# Patient Record
Sex: Female | Born: 1990 | Race: Black or African American | Hispanic: No | Marital: Single | Smoking: Current some day smoker
Health system: Southern US, Community
[De-identification: ages and names within clinical notes are randomized; demographics above are authoritative.]

## PROBLEM LIST (undated history)

## (undated) ENCOUNTER — Inpatient Hospital Stay (HOSPITAL_COMMUNITY): Payer: Self-pay

## (undated) DIAGNOSIS — IMO0002 Reserved for concepts with insufficient information to code with codable children: Secondary | ICD-10-CM

## (undated) DIAGNOSIS — R87619 Unspecified abnormal cytological findings in specimens from cervix uteri: Secondary | ICD-10-CM

## (undated) HISTORY — DX: Reserved for concepts with insufficient information to code with codable children: IMO0002

## (undated) HISTORY — DX: Unspecified abnormal cytological findings in specimens from cervix uteri: R87.619

---
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
## 2009-04-06 ENCOUNTER — Inpatient Hospital Stay (HOSPITAL_COMMUNITY): Admission: AD | Admit: 2009-04-06 | Discharge: 2009-04-06 | Payer: Self-pay | Admitting: Obstetrics & Gynecology

## 2009-04-06 ENCOUNTER — Ambulatory Visit: Payer: Self-pay | Admitting: Advanced Practice Midwife

## 2009-04-28 ENCOUNTER — Inpatient Hospital Stay (HOSPITAL_COMMUNITY): Admission: AD | Admit: 2009-04-28 | Discharge: 2009-04-28 | Payer: Self-pay | Admitting: Family Medicine

## 2009-04-28 ENCOUNTER — Ambulatory Visit: Payer: Self-pay | Admitting: Family Medicine

## 2009-05-13 ENCOUNTER — Ambulatory Visit (HOSPITAL_COMMUNITY): Admission: RE | Admit: 2009-05-13 | Discharge: 2009-05-13 | Payer: Self-pay | Admitting: Obstetrics & Gynecology

## 2009-08-21 ENCOUNTER — Ambulatory Visit: Payer: Self-pay | Admitting: Advanced Practice Midwife

## 2009-08-21 ENCOUNTER — Inpatient Hospital Stay (HOSPITAL_COMMUNITY): Admission: AD | Admit: 2009-08-21 | Discharge: 2009-08-21 | Payer: Self-pay | Admitting: Family Medicine

## 2009-08-26 ENCOUNTER — Inpatient Hospital Stay (HOSPITAL_COMMUNITY): Admission: AD | Admit: 2009-08-26 | Discharge: 2009-08-26 | Payer: Self-pay | Admitting: Obstetrics & Gynecology

## 2009-08-26 ENCOUNTER — Ambulatory Visit: Payer: Self-pay | Admitting: Advanced Practice Midwife

## 2009-08-27 ENCOUNTER — Inpatient Hospital Stay (HOSPITAL_COMMUNITY): Admission: AD | Admit: 2009-08-27 | Discharge: 2009-08-27 | Payer: Self-pay | Admitting: Obstetrics & Gynecology

## 2009-08-28 ENCOUNTER — Ambulatory Visit: Payer: Self-pay | Admitting: Advanced Practice Midwife

## 2009-08-28 ENCOUNTER — Inpatient Hospital Stay (HOSPITAL_COMMUNITY): Admission: AD | Admit: 2009-08-28 | Discharge: 2009-08-30 | Payer: Self-pay | Admitting: Obstetrics & Gynecology

## 2009-12-28 ENCOUNTER — Emergency Department (HOSPITAL_COMMUNITY): Admission: EM | Admit: 2009-12-28 | Discharge: 2009-12-28 | Payer: Self-pay | Admitting: Emergency Medicine

## 2010-01-24 ENCOUNTER — Emergency Department (HOSPITAL_COMMUNITY)
Admission: EM | Admit: 2010-01-24 | Discharge: 2010-01-24 | Payer: Self-pay | Source: Home / Self Care | Admitting: Family Medicine

## 2010-02-19 ENCOUNTER — Emergency Department (HOSPITAL_COMMUNITY): Admission: EM | Admit: 2010-02-19 | Discharge: 2010-02-20 | Payer: Self-pay | Admitting: Emergency Medicine

## 2010-02-21 ENCOUNTER — Emergency Department (HOSPITAL_COMMUNITY): Admission: EM | Admit: 2010-02-21 | Discharge: 2010-02-21 | Payer: Self-pay | Admitting: Emergency Medicine

## 2010-06-25 ENCOUNTER — Emergency Department (HOSPITAL_COMMUNITY)
Admission: EM | Admit: 2010-06-25 | Discharge: 2010-06-26 | Payer: Self-pay | Source: Home / Self Care | Admitting: Emergency Medicine

## 2010-06-28 LAB — POCT PREGNANCY, URINE: Preg Test, Ur: NEGATIVE

## 2010-08-26 LAB — POCT PREGNANCY, URINE: Preg Test, Ur: NEGATIVE

## 2010-08-27 LAB — POCT PREGNANCY, URINE: Preg Test, Ur: NEGATIVE

## 2010-08-28 LAB — WET PREP, GENITAL
Trich, Wet Prep: NONE SEEN
Yeast Wet Prep HPF POC: NONE SEEN

## 2010-08-28 LAB — URINE MICROSCOPIC-ADD ON

## 2010-08-28 LAB — URINALYSIS, ROUTINE W REFLEX MICROSCOPIC
Bilirubin Urine: NEGATIVE
Glucose, UA: NEGATIVE mg/dL
Hgb urine dipstick: NEGATIVE
Ketones, ur: NEGATIVE mg/dL
Nitrite: NEGATIVE
Protein, ur: NEGATIVE mg/dL
Specific Gravity, Urine: 1.026 (ref 1.005–1.030)
Urobilinogen, UA: 1 mg/dL (ref 0.0–1.0)
pH: 6.5 (ref 5.0–8.0)

## 2010-08-28 LAB — URINE CULTURE
Colony Count: >=100000
Culture  Setup Time: 201107182134

## 2010-08-28 LAB — GC/CHLAMYDIA PROBE AMP, GENITAL: GC Probe Amp, Genital: NEGATIVE

## 2010-08-28 LAB — POCT PREGNANCY, URINE: Preg Test, Ur: NEGATIVE

## 2010-09-05 LAB — URINALYSIS, ROUTINE W REFLEX MICROSCOPIC
Bilirubin Urine: NEGATIVE
Glucose, UA: NEGATIVE mg/dL
Hgb urine dipstick: NEGATIVE
Hgb urine dipstick: NEGATIVE
Ketones, ur: NEGATIVE mg/dL
Protein, ur: NEGATIVE mg/dL
Protein, ur: NEGATIVE mg/dL
Urobilinogen, UA: 1 mg/dL (ref 0.0–1.0)

## 2010-09-05 LAB — WET PREP, GENITAL
Clue Cells Wet Prep HPF POC: NONE SEEN
Trich, Wet Prep: NONE SEEN

## 2010-09-05 LAB — COMPREHENSIVE METABOLIC PANEL
AST: 23 U/L (ref 0–37)
Albumin: 3.3 g/dL — ABNORMAL LOW (ref 3.5–5.2)
Calcium: 8.9 mg/dL (ref 8.4–10.5)
Creatinine, Ser: 0.49 mg/dL (ref 0.4–1.2)
GFR calc Af Amer: >60 mL/min (ref >60)
Total Protein: 6.9 g/dL (ref 6.0–8.3)

## 2010-09-05 LAB — CBC
MCHC: 33.3 g/dL (ref 30.0–36.0)
MCV: 84.5 fL (ref 78.0–100.0)
Platelets: 220 10*3/uL (ref 150–400)
WBC: 11.6 10*3/uL — ABNORMAL HIGH (ref 4.0–10.5)

## 2010-09-05 LAB — GC/CHLAMYDIA PROBE AMP, GENITAL: GC Probe Amp, Genital: NEGATIVE

## 2010-09-15 LAB — STREP B DNA PROBE

## 2010-09-15 LAB — GC/CHLAMYDIA PROBE AMP, GENITAL: GC Probe Amp, Genital: NEGATIVE

## 2010-09-16 LAB — WET PREP, GENITAL
Trich, Wet Prep: NONE SEEN
Yeast Wet Prep HPF POC: NONE SEEN

## 2010-09-16 LAB — RUBELLA SCREEN: Rubella: >500 IU/mL — ABNORMAL HIGH

## 2010-09-16 LAB — URINE MICROSCOPIC-ADD ON

## 2010-09-16 LAB — TYPE AND SCREEN
ABO/RH(D): O POS
Antibody Screen: NEGATIVE

## 2010-09-16 LAB — CBC
HCT: 29.6 % — ABNORMAL LOW (ref 36.0–46.0)
Hemoglobin: 9.8 g/dL — ABNORMAL LOW (ref 12.0–15.0)
RBC: 3.44 MIL/uL — ABNORMAL LOW (ref 3.87–5.11)
RDW: 13.2 % (ref 11.5–15.5)

## 2010-09-16 LAB — URINE CULTURE

## 2010-09-16 LAB — URINALYSIS, ROUTINE W REFLEX MICROSCOPIC
Glucose, UA: NEGATIVE mg/dL
Specific Gravity, Urine: 1.015 (ref 1.005–1.030)
Urobilinogen, UA: 0.2 mg/dL (ref 0.0–1.0)

## 2010-09-16 LAB — DIFFERENTIAL
Basophils Absolute: 0 10*3/uL (ref 0.0–0.1)
Eosinophils Relative: 2 % (ref 0–5)
Lymphocytes Relative: 28 % (ref 12–46)
Monocytes Absolute: 0.6 10*3/uL (ref 0.1–1.0)
Monocytes Relative: 8 % (ref 3–12)

## 2010-09-16 LAB — SICKLE CELL SCREEN: Sickle Cell Screen: NEGATIVE

## 2010-09-16 LAB — RAPID URINE DRUG SCREEN, HOSP PERFORMED
Barbiturates: NOT DETECTED
Opiates: NOT DETECTED
Tetrahydrocannabinol: NOT DETECTED

## 2010-09-16 LAB — RPR: RPR Ser Ql: NONREACTIVE

## 2010-09-19 LAB — URINALYSIS, ROUTINE W REFLEX MICROSCOPIC
Bilirubin Urine: NEGATIVE
Glucose, UA: NEGATIVE mg/dL
Hgb urine dipstick: NEGATIVE
Ketones, ur: NEGATIVE mg/dL
Nitrite: NEGATIVE
Specific Gravity, Urine: 1.026 (ref 1.005–1.030)
pH: 7 (ref 5.0–8.0)

## 2010-09-19 LAB — CBC
HCT: 34.9 % — ABNORMAL LOW (ref 36.0–46.0)
Hemoglobin: 11.8 g/dL — ABNORMAL LOW (ref 12.0–15.0)
MCHC: 33.8 g/dL (ref 30.0–36.0)
MCV: 85.4 fL (ref 78.0–100.0)
Platelets: 256 10*3/uL (ref 150–400)
RBC: 4.09 MIL/uL (ref 3.87–5.11)
RDW: 13 % (ref 11.5–15.5)
WBC: 6.8 10*3/uL (ref 4.0–10.5)

## 2010-09-19 LAB — DIFFERENTIAL
Basophils Absolute: 0.1 10*3/uL (ref 0.0–0.1)
Basophils Relative: 1 % (ref 0–1)
Eosinophils Absolute: 0.1 10*3/uL (ref 0.0–0.7)
Eosinophils Relative: 1 % (ref 0–5)
Lymphocytes Relative: 36 % (ref 12–46)
Lymphs Abs: 2.4 10*3/uL (ref 0.7–4.0)
Monocytes Absolute: 0.5 10*3/uL (ref 0.1–1.0)
Monocytes Relative: 7 % (ref 3–12)
Neutro Abs: 3.8 10*3/uL (ref 1.7–7.7)
Neutrophils Relative %: 56 % (ref 43–77)

## 2010-09-19 LAB — URINE CULTURE

## 2010-09-19 LAB — URINE MICROSCOPIC-ADD ON

## 2010-09-19 LAB — BASIC METABOLIC PANEL
BUN: 6 mg/dL (ref 6–23)
Chloride: 106 mEq/L (ref 96–112)
GFR calc non Af Amer: >60 mL/min (ref >60)
Glucose, Bld: 97 mg/dL (ref 70–99)
Potassium: 3.7 mEq/L (ref 3.5–5.1)
Sodium: 137 mEq/L (ref 135–145)

## 2010-09-19 LAB — POCT PREGNANCY, URINE: Preg Test, Ur: POSITIVE

## 2010-09-21 LAB — DIFFERENTIAL
Basophils Relative: 0 % (ref 0–1)
Lymphocytes Relative: 11 % — ABNORMAL LOW (ref 12–46)
Lymphs Abs: 1.1 10*3/uL (ref 0.7–4.0)
Monocytes Relative: 4 % (ref 3–12)
Neutro Abs: 9 10*3/uL — ABNORMAL HIGH (ref 1.7–7.7)
Neutrophils Relative %: 85 % — ABNORMAL HIGH (ref 43–77)

## 2010-09-21 LAB — CBC
HCT: 35.1 % — ABNORMAL LOW (ref 36.0–46.0)
Hemoglobin: 12 g/dL (ref 12.0–15.0)
MCHC: 34.1 g/dL (ref 30.0–36.0)
MCV: 83.7 fL (ref 78.0–100.0)
Platelets: 225 10*3/uL (ref 150–400)
RDW: 13.5 % (ref 11.5–15.5)

## 2010-09-21 LAB — URINALYSIS, ROUTINE W REFLEX MICROSCOPIC
Hgb urine dipstick: NEGATIVE
Nitrite: NEGATIVE
Specific Gravity, Urine: 1.027 (ref 1.005–1.030)
Urobilinogen, UA: 1 mg/dL (ref 0.0–1.0)
pH: 8.5 — ABNORMAL HIGH (ref 5.0–8.0)

## 2010-09-21 LAB — WET PREP, GENITAL
Trich, Wet Prep: NONE SEEN
Yeast Wet Prep HPF POC: NONE SEEN

## 2010-09-21 LAB — COMPREHENSIVE METABOLIC PANEL
Alkaline Phosphatase: 106 U/L (ref 39–117)
BUN: 7 mg/dL (ref 6–23)
Calcium: 8.9 mg/dL (ref 8.4–10.5)
Creatinine, Ser: 0.7 mg/dL (ref 0.4–1.2)
Glucose, Bld: 113 mg/dL — ABNORMAL HIGH (ref 70–99)
Total Protein: 7.5 g/dL (ref 6.0–8.3)

## 2010-09-21 LAB — URINE MICROSCOPIC-ADD ON

## 2010-10-13 ENCOUNTER — Emergency Department (HOSPITAL_COMMUNITY)
Admission: EM | Admit: 2010-10-13 | Discharge: 2010-10-13 | Discharge status: Against Medical Advice | Payer: Self-pay | Attending: Emergency Medicine | Admitting: Emergency Medicine

## 2010-10-13 DIAGNOSIS — M79609 Pain in unspecified limb: Secondary | ICD-10-CM | POA: Insufficient documentation

## 2011-01-14 ENCOUNTER — Emergency Department (HOSPITAL_COMMUNITY)
Admission: EM | Admit: 2011-01-14 | Discharge: 2011-01-15 | Disposition: A | Discharge status: Home / Self Care | Payer: Self-pay | Attending: Emergency Medicine | Admitting: Emergency Medicine

## 2011-01-14 DIAGNOSIS — L02219 Cutaneous abscess of trunk, unspecified: Secondary | ICD-10-CM | POA: Insufficient documentation

## 2011-03-03 LAB — CBC
Platelets: 209
WBC: 9

## 2011-03-03 LAB — RPR: RPR Ser Ql: NONREACTIVE

## 2011-03-18 LAB — WET PREP, GENITAL
Trich, Wet Prep: NONE SEEN
Yeast Wet Prep HPF POC: NONE SEEN

## 2011-03-18 LAB — GC/CHLAMYDIA PROBE AMP, URINE
Chlamydia, Swab/Urine, PCR: NEGATIVE
GC Probe Amp, Urine: NEGATIVE

## 2011-03-18 LAB — URINALYSIS, ROUTINE W REFLEX MICROSCOPIC
Nitrite: NEGATIVE
Specific Gravity, Urine: <1.005 — ABNORMAL LOW
Urobilinogen, UA: 1

## 2011-03-25 LAB — URINALYSIS, ROUTINE W REFLEX MICROSCOPIC
Glucose, UA: NEGATIVE
Specific Gravity, Urine: 1.025
pH: 7

## 2011-03-25 LAB — URINE MICROSCOPIC-ADD ON

## 2011-03-25 LAB — WET PREP, GENITAL

## 2011-03-25 LAB — GC/CHLAMYDIA PROBE AMP, GENITAL
Chlamydia, DNA Probe: POSITIVE — AB
GC Probe Amp, Genital: NEGATIVE

## 2011-03-29 LAB — URINE MICROSCOPIC-ADD ON

## 2011-03-29 LAB — URINALYSIS, ROUTINE W REFLEX MICROSCOPIC
Bilirubin Urine: NEGATIVE
Glucose, UA: NEGATIVE
Nitrite: NEGATIVE
Specific Gravity, Urine: >1.03 — ABNORMAL HIGH
pH: 6.5

## 2011-03-29 LAB — GC/CHLAMYDIA PROBE AMP, GENITAL: Chlamydia, DNA Probe: POSITIVE — AB

## 2011-03-29 LAB — WET PREP, GENITAL: Trich, Wet Prep: NONE SEEN

## 2011-05-10 ENCOUNTER — Inpatient Hospital Stay (HOSPITAL_COMMUNITY)
Admission: AD | Admit: 2011-05-10 | Discharge: 2011-05-10 | Disposition: A | Discharge status: Home / Self Care | Payer: Self-pay | Source: Ambulatory Visit | Attending: Family Medicine | Admitting: Family Medicine

## 2011-05-10 ENCOUNTER — Encounter (HOSPITAL_COMMUNITY): Payer: Self-pay | Admitting: *Deleted

## 2011-05-10 ENCOUNTER — Inpatient Hospital Stay (HOSPITAL_COMMUNITY)
Admission: AD | Admit: 2011-05-10 | Discharge: 2011-05-10 | Discharge status: Against Medical Advice | Payer: Self-pay | Source: Ambulatory Visit | Attending: Family Medicine | Admitting: Family Medicine

## 2011-05-10 ENCOUNTER — Inpatient Hospital Stay (HOSPITAL_COMMUNITY): Payer: Self-pay

## 2011-05-10 DIAGNOSIS — O99891 Other specified diseases and conditions complicating pregnancy: Secondary | ICD-10-CM | POA: Insufficient documentation

## 2011-05-10 DIAGNOSIS — R109 Unspecified abdominal pain: Secondary | ICD-10-CM | POA: Insufficient documentation

## 2011-05-10 DIAGNOSIS — Z331 Pregnant state, incidental: Secondary | ICD-10-CM

## 2011-05-10 LAB — URINALYSIS, ROUTINE W REFLEX MICROSCOPIC
Bilirubin Urine: NEGATIVE
Leukocytes, UA: NEGATIVE
Nitrite: NEGATIVE
Specific Gravity, Urine: 1.025 (ref 1.005–1.030)
pH: 6 (ref 5.0–8.0)

## 2011-05-10 NOTE — Progress Notes (Signed)
Pt came out to desk, stated she cannot stay.  AMA form signed.

## 2011-05-10 NOTE — Progress Notes (Signed)
MD notified pt back from U/S, results back... Is coming to see pt.

## 2011-05-10 NOTE — Progress Notes (Signed)
Pt wants to know how long visit will be, has to be at work @ 1530.  Encouraged pt to stay, she states she has only had this job 3 days.  Pt will call employer to say she is in hospital, informed pt we can give her a note for work.

## 2011-05-10 NOTE — Progress Notes (Signed)
Pt c/o lower abdominal cramping x4 days. Pt taking nothing for pain. LMp 04/06/11

## 2011-05-10 NOTE — ED Provider Notes (Signed)
History     Chief Complaint  Patient presents with  . Possible Pregnancy   Pelvic Pain The patient's primary symptoms include a missed menses, pelvic pain and a vaginal discharge. The patient's pertinent negatives include no genital itching, genital lesions, genital odor, genital rash or vaginal bleeding. This is a new problem. The current episode started yesterday. The problem occurs constantly. The problem has been gradually improving. The pain is moderate. The problem affects the right side. She is pregnant. Pertinent negatives include no abdominal pain, chills, diarrhea, dysuria, fever, flank pain, headaches, nausea, urgency or vomiting. The vaginal discharge was normal. There has been no bleeding. She has not been passing clots. She has not been passing tissue. The symptoms are aggravated by nothing. She has tried nothing for the symptoms. The treatment provided no relief. She is sexually active. No, her partner does not have an STD. She uses condoms for contraception. Her menstrual history has been regular. There is no history of an abdominal surgery, a Cesarean section, an ectopic pregnancy, a gynecological surgery, menorrhagia or metrorrhagia.    OB History    Grav Para Term Preterm Abortions TAB SAB Ect Mult Living   4 3 3       3       Past Medical History  Diagnosis Date  . No pertinent past medical history     Past Surgical History  Procedure Date  . No past surgeries     No family history on file.  History  Substance Use Topics  . Smoking status: Current Everyday Smoker -- 0.2 packs/day    Types: Cigarettes  . Smokeless tobacco: Not on file  . Alcohol Use: No    Allergies: No Known Allergies  No prescriptions prior to admission    Review of Systems  Constitutional: Negative for fever and chills.  Gastrointestinal: Negative for nausea, vomiting, abdominal pain and diarrhea.  Genitourinary: Positive for vaginal discharge, pelvic pain and missed menses. Negative  for dysuria, urgency, flank pain and menorrhagia.  Neurological: Negative for headaches.  All other systems reviewed and are negative.   Physical Exam   Blood pressure 95/50, pulse 85, temperature 98.7 F (37.1 C), temperature source Oral, resp. rate 18, last menstrual period 04/02/2020.  Physical Exam  Constitutional: She is oriented to person, place, and time. She appears well-developed and well-nourished.  HENT:  Head: Normocephalic and atraumatic.  Cardiovascular: Normal rate.   Respiratory: Effort normal.  GI: Soft. Bowel sounds are normal. She exhibits no distension and no mass. There is tenderness. There is no rebound and no guarding.       Right lower quadrant tenderness.  No guarding, rebound tenderness.  Neurological: She is alert and oriented to person, place, and time.  Skin: Skin is warm and dry.   patient deferred vaginal exam  Urine pregnancy test was positive Pelvic ultrasound confirmed intrauterine pregnancy with no evidence of ectopic pregnancy. Dating confirms gestational age of [redacted] weeks and 2 days MAU Course  Procedures  Assessment and Plan  39.  20 year old [id number] with IUP at 5 weeks 2 days by LMP 2.  incidental pregnancy.  After ultrasound patient's states that her pelvic pain has decreased. She is being discharged to home. She will schedule prenatal appointments and followup as an outpatient.  Corin Formisano JEHIEL 21/27/2012, 5:26 PM

## 2011-05-10 NOTE — Progress Notes (Signed)
was here earlier, had to leave to report to work. Little pressure when walking. No pain when sitting.

## 2011-05-18 ENCOUNTER — Encounter (HOSPITAL_COMMUNITY): Payer: Self-pay | Admitting: *Deleted

## 2011-05-18 ENCOUNTER — Inpatient Hospital Stay (HOSPITAL_COMMUNITY)
Admission: AD | Admit: 2011-05-18 | Discharge: 2011-05-18 | Disposition: A | Discharge status: Home / Self Care | Payer: Self-pay | Source: Ambulatory Visit | Attending: Obstetrics & Gynecology | Admitting: Obstetrics & Gynecology

## 2011-05-18 DIAGNOSIS — N390 Urinary tract infection, site not specified: Secondary | ICD-10-CM | POA: Insufficient documentation

## 2011-05-18 DIAGNOSIS — O2340 Unspecified infection of urinary tract in pregnancy, unspecified trimester: Secondary | ICD-10-CM

## 2011-05-18 DIAGNOSIS — O2390 Unspecified genitourinary tract infection in pregnancy, unspecified trimester: Secondary | ICD-10-CM | POA: Insufficient documentation

## 2011-05-18 DIAGNOSIS — O210 Mild hyperemesis gravidarum: Secondary | ICD-10-CM | POA: Insufficient documentation

## 2011-05-18 DIAGNOSIS — R110 Nausea: Secondary | ICD-10-CM

## 2011-05-18 LAB — URINALYSIS, ROUTINE W REFLEX MICROSCOPIC
Leukocytes, UA: NEGATIVE
Nitrite: POSITIVE — AB
Protein, ur: 30 mg/dL — AB
Urobilinogen, UA: 4 mg/dL — ABNORMAL HIGH (ref 0.0–1.0)

## 2011-05-18 LAB — URINE MICROSCOPIC-ADD ON

## 2011-05-18 MED ORDER — ONDANSETRON 8 MG PO TBDP
8.0000 mg | ORAL_TABLET | Freq: Once | ORAL | Status: AC
  Administered 2011-05-18: 8 mg via ORAL
  Filled 2011-05-18: qty 1

## 2011-05-18 MED ORDER — ONDANSETRON 8 MG PO TBDP: 8.0000 mg | ORAL_TABLET | Freq: Two times a day (BID) | ORAL | Status: AC | PRN

## 2011-05-18 MED ORDER — SULFAMETHOXAZOLE-TRIMETHOPRIM 800-160 MG PO TABS: 1.0000 | ORAL_TABLET | Freq: Two times a day (BID) | ORAL | Status: AC

## 2011-05-18 NOTE — Progress Notes (Signed)
havn't been able to eat.  Feels nauseated, not actually throwing up.  (diet discussed)

## 2011-05-18 NOTE — ED Provider Notes (Signed)
History     Chief Complaint  Patient presents with  . Morning Sickness   HPI Monique Harris 20 y.o. 6w 3d gestation.  States she has not been able to eat.  Is able to drink fluids but not eat food.  Has not vomited but has nausea to the point she cannot eat.  Denies any dysuria.  Has not yet started prenatal care but [facility]   OB History    Grav Para Term Preterm Abortions TAB SAB Ect Mult Living   4 3 3  0 0 0 0 0 0 3      Past Medical History  Diagnosis Date  . No pertinent past medical history     Past Surgical History  Procedure Date  . No past surgeries     History reviewed. No pertinent family history.  History  Substance Use Topics  . Smoking status: Former Smoker -- 0.2 packs/day    Types: Cigarettes    Quit date: 05/12/2011  . Smokeless tobacco: Never Used  . Alcohol Use: No    Allergies: No Known Allergies  No prescriptions prior to admission    Review of Systems  Gastrointestinal: Positive for nausea. Negative for vomiting.  Genitourinary: Negative for dysuria.       No vaginal bleeding   Physical Exam   Blood pressure 99/53, pulse 82, temperature 98.7 F (37.1 C), temperature source Oral, resp. rate 18, height 5\' 2"  (1.575 m), weight 103 lb 9.6 oz (46.993 kg), last menstrual period 04/03/2011.  Physical Exam  Nursing note and vitals reviewed. Constitutional: She is oriented to person, place, and time. She appears well-developed and well-nourished.  HENT:  Head: Normocephalic.  Eyes: EOM are normal.  Neck: Neck supple.  Musculoskeletal: Normal range of motion.  Neurological: She is alert and oriented to person, place, and time.  Skin: Skin is warm and dry.  Psychiatric: She has a normal mood and affect.    MAU Course  Procedures  MDM Results for orders placed during the hospital encounter of 05/18/11 (from the past 24 hour(s))  URINALYSIS, ROUTINE W REFLEX MICROSCOPIC     Status: Abnormal   Collection  Time   05/18/11  7:40 AM      Component Value Range   Color, Urine YELLOW  YELLOW    APPearance CLOUDY (*) CLEAR    Specific Gravity, Urine 1.025  1.005 - 1.030    pH 6.5  5.0 - 8.0    Glucose, UA NEGATIVE  NEGATIVE (mg/dL)   Hgb urine dipstick NEGATIVE  NEGATIVE    Bilirubin Urine NEGATIVE  NEGATIVE    Ketones, ur 15 (*) NEGATIVE (mg/dL)   Protein, ur 30 (*) NEGATIVE (mg/dL)   Urobilinogen, UA 4.0 (*) 0.0 - 1.0 (mg/dL)   Nitrite POSITIVE (*) NEGATIVE    Leukocytes, UA NEGATIVE  NEGATIVE   URINE MICROSCOPIC-ADD ON     Status: Abnormal   Collection Time   05/18/11  7:40 AM      Component Value Range   Squamous Epithelial / LPF RARE  RARE    WBC, UA 3-6  <3 (WBC/hpf)   RBC / HPF 0-2  <3 (RBC/hpf)   Bacteria, UA MANY (*) RARE    Urine culture pending Client did eat a few bites of sausage biscuit before coming to MAU today.  Has the remainder with her.  Assessment and Plan  Nausea UTI in pregnancy  Plan Will give Zofran  Will prescribe Septra DS  for UTI BRAT diet. No smoking, no drugs, no alcohol.   Take a prenatal vitamin one by mouth every day.   Eat small frequent snacks to avoid nausea.   Begin prenatal care as soon as possible.   Monique Harris 05/18/2011, 8:28 AM   Nolene Bernheim, NP 05/18/11 1323

## 2011-05-20 LAB — URINE CULTURE: Colony Count: >=100000

## 2011-08-15 ENCOUNTER — Inpatient Hospital Stay (HOSPITAL_COMMUNITY)
Admission: AD | Admit: 2011-08-15 | Discharge: 2011-08-15 | Disposition: A | Discharge status: Home / Self Care | Payer: Medicaid Other | Source: Ambulatory Visit | Attending: Obstetrics and Gynecology | Admitting: Obstetrics and Gynecology

## 2011-08-15 ENCOUNTER — Encounter (HOSPITAL_COMMUNITY): Payer: Self-pay | Admitting: *Deleted

## 2011-08-15 DIAGNOSIS — N390 Urinary tract infection, site not specified: Secondary | ICD-10-CM | POA: Insufficient documentation

## 2011-08-15 DIAGNOSIS — O368190 Decreased fetal movements, unspecified trimester, not applicable or unspecified: Secondary | ICD-10-CM | POA: Insufficient documentation

## 2011-08-15 DIAGNOSIS — N760 Acute vaginitis: Secondary | ICD-10-CM | POA: Insufficient documentation

## 2011-08-15 DIAGNOSIS — O2340 Unspecified infection of urinary tract in pregnancy, unspecified trimester: Secondary | ICD-10-CM

## 2011-08-15 DIAGNOSIS — O2390 Unspecified genitourinary tract infection in pregnancy, unspecified trimester: Secondary | ICD-10-CM | POA: Insufficient documentation

## 2011-08-15 DIAGNOSIS — A499 Bacterial infection, unspecified: Secondary | ICD-10-CM

## 2011-08-15 DIAGNOSIS — B9689 Other specified bacterial agents as the cause of diseases classified elsewhere: Secondary | ICD-10-CM | POA: Insufficient documentation

## 2011-08-15 LAB — URINALYSIS, ROUTINE W REFLEX MICROSCOPIC
Ketones, ur: NEGATIVE mg/dL
Leukocytes, UA: NEGATIVE
Nitrite: POSITIVE — AB
Protein, ur: NEGATIVE mg/dL

## 2011-08-15 LAB — WET PREP, GENITAL: Trich, Wet Prep: NONE SEEN

## 2011-08-15 LAB — URINE MICROSCOPIC-ADD ON

## 2011-08-15 MED ORDER — CEPHALEXIN 500 MG PO CAPS: 500.0000 mg | ORAL_CAPSULE | Freq: Three times a day (TID) | ORAL | Status: AC

## 2011-08-15 MED ORDER — METRONIDAZOLE 500 MG PO TABS: 500.0000 mg | ORAL_TABLET | Freq: Two times a day (BID) | ORAL | Status: AC

## 2011-08-15 NOTE — Progress Notes (Signed)
Hasn't felt baby move yet, concerned about  Thick vag discharge.  occ lower abd pain, ? If UTI is gone.  Boyfriend had like " a rash" on privates.  Just filed for [employer] today. No care yet.

## 2011-08-15 NOTE — ED Notes (Signed)
Walked into triage, carrying plate of food. Pt to restroom when first brought back.

## 2011-08-15 NOTE — ED Provider Notes (Signed)
History     Chief Complaint  Patient presents with  . Vaginal Discharge  . Abdominal Pain  . Decreased Fetal Movement   HPI 21 y.o. [id number] at [redacted]w[redacted]d with thick vaginal discharge, states boyfriend had a rash in genital area recently. Also concerned because she has not felt fetal movement yet. No bleeding or pain. Has not started care yet, planning on going to [facility].    Past Medical History  Diagnosis Date  . No pertinent past medical history     Past Surgical History  Procedure Date  . No past surgeries     Family History  Problem Relation Age of Onset  . Anesthesia problems Neg Hx     History  Substance Use Topics  . Smoking status: Former Smoker -- 0.2 packs/day    Types: Cigarettes    Quit date: 05/12/2011  . Smokeless tobacco: Never Used  . Alcohol Use: No    Allergies: No Known Allergies  No prescriptions prior to admission    Review of Systems  Constitutional: Negative.   Respiratory: Negative.   Cardiovascular: Negative.   Gastrointestinal: Negative for nausea, vomiting, abdominal pain, diarrhea and constipation.  Genitourinary: Negative for dysuria, urgency, frequency, hematuria and flank pain.       Negative for vaginal bleeding, Positive for vaginal discharge  Musculoskeletal: Negative.   Neurological: Negative.   Psychiatric/Behavioral: Negative.    Physical Exam   Blood pressure 108/54, pulse 94, temperature 97.5 F (36.4 C), temperature source Oral, resp. rate 18, height 5\' 3"  (1.6 m), weight 118 lb (53.524 kg), last menstrual period 04/03/2011.  Physical Exam  Constitutional: She is oriented to person, place, and time. She appears well-developed and well-nourished. No distress.  HENT:  Head: Normocephalic and atraumatic.  Cardiovascular: Normal rate.   Respiratory: Effort normal. No respiratory distress.  GI: Soft. She exhibits no distension and no mass. There is no tenderness. There is no rebound and no guarding.  Genitourinary: There is  no rash or lesion on the right labia. There is no rash or lesion on the left labia. Uterus is not deviated, not enlarged, not fixed and not tender. Cervix exhibits no motion tenderness, no discharge and no friability. Right adnexum displays no mass, no tenderness and no fullness. Left adnexum displays no mass, no tenderness and no fullness. No erythema, tenderness or bleeding around the vagina. Vaginal discharge (foamy white vaginal discharge, copius) found.  Neurological: She is alert and oriented to person, place, and time.  Skin: Skin is warm and dry.  Psychiatric: She has a normal mood and affect.    MAU Course  Procedures  Results for orders placed during the hospital encounter of 08/15/11 (from the past 24 hour(s))  URINALYSIS, ROUTINE W REFLEX MICROSCOPIC     Status: Abnormal   Collection Time   08/15/11  2:20 PM      Component Value Range   Color, Urine YELLOW  YELLOW    APPearance CLEAR  CLEAR    Specific Gravity, Urine 1.020  1.005 - 1.030    pH 8.0  5.0 - 8.0    Glucose, UA NEGATIVE  NEGATIVE (mg/dL)   Hgb urine dipstick NEGATIVE  NEGATIVE    Bilirubin Urine NEGATIVE  NEGATIVE    Ketones, ur NEGATIVE  NEGATIVE (mg/dL)   Protein, ur NEGATIVE  NEGATIVE (mg/dL)   Urobilinogen, UA 1.0  0.0 - 1.0 (mg/dL)   Nitrite POSITIVE (*) NEGATIVE    Leukocytes, UA NEGATIVE  NEGATIVE   URINE MICROSCOPIC-ADD ON  Status: Abnormal   Collection Time   08/15/11  2:20 PM      Component Value Range   Squamous Epithelial / LPF FEW (*) RARE    WBC, UA 3-6  <3 (WBC/hpf)   Bacteria, UA FEW (*) RARE    Urine-Other MUCOUS PRESENT    WET PREP, GENITAL     Status: Abnormal   Collection Time   08/15/11  2:50 PM      Component Value Range   Yeast Wet Prep HPF POC NONE SEEN  NONE SEEN    Trich, Wet Prep NONE SEEN  NONE SEEN    Clue Cells Wet Prep HPF POC MODERATE (*) NONE SEEN    WBC, Wet Prep HPF POC MANY (*) NONE SEEN      Assessment and Plan  21 y.o. [id number] at [redacted]w[redacted]d BV - rx flagyl UTI - rx  Keflex Urine culture and GC/CT pending Start prenatal care ASAP Shandee Jergens 08/15/2011, 4:19 PM

## 2011-08-16 LAB — URINE CULTURE
Colony Count: >=100000
Culture  Setup Time: 201303050121

## 2011-08-17 NOTE — ED Provider Notes (Signed)
Agree with above note.  [name] 08/17/2011 7:52 AM   

## 2011-09-09 IMAGING — CT CT HEAD W/O CM
2 series · 16 of 30 positions shown, 20 images · non-contrast
Comparison: None

CLINICAL DATA: Frontal headache

CT HEAD WITHOUT CONTRAST
TECHNIQUE: Contiguous axial images were obtained from the base of
the skull through the vertex without contrast.

[Series 2: head w/o · axial · non-contrast · 0.49mm/px · z∈[+132,+262]mm · 13 of 32 slices shown, 17 images]
[im 3/32  brain]
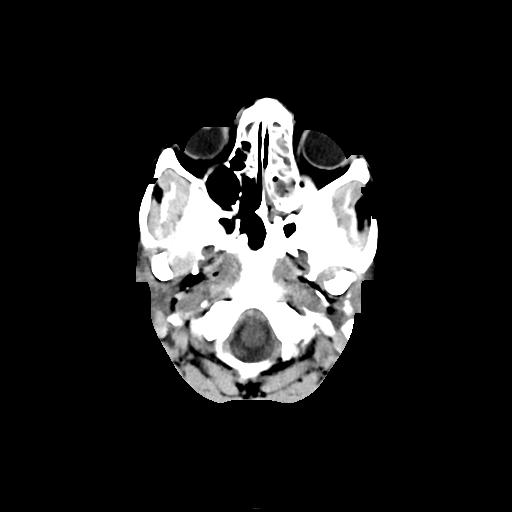
[im 3/32  bone]
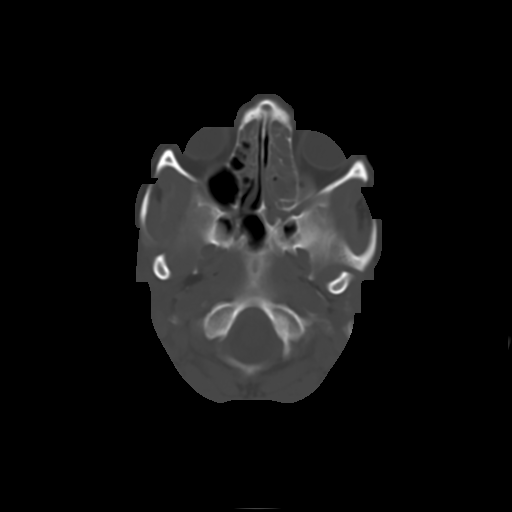
[im 5/32  brain]
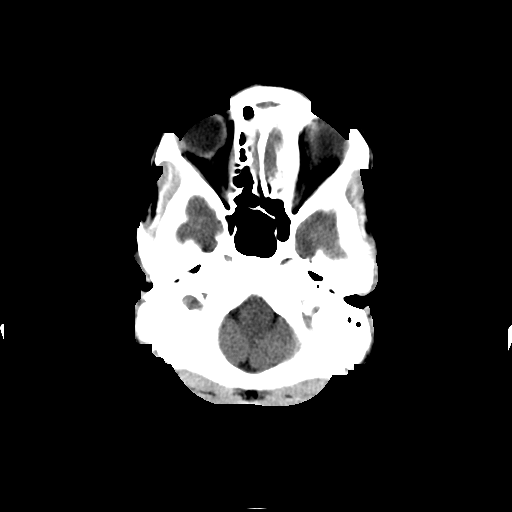
[im 7/32  brain]
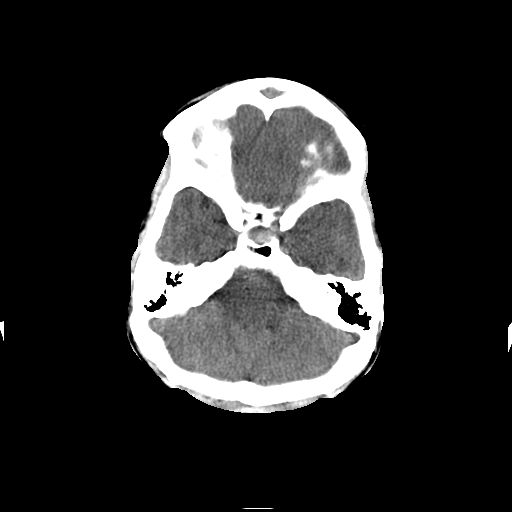
[im 9/32  brain]
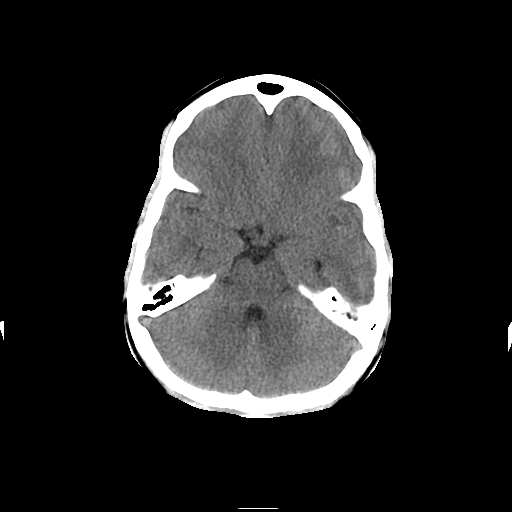
[im 12/32  brain]
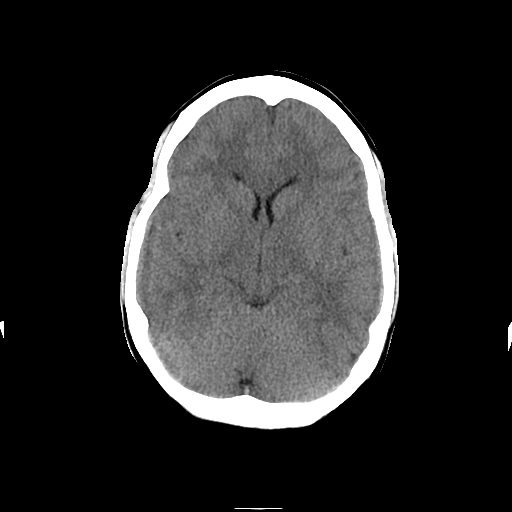
[im 12/32  bone]
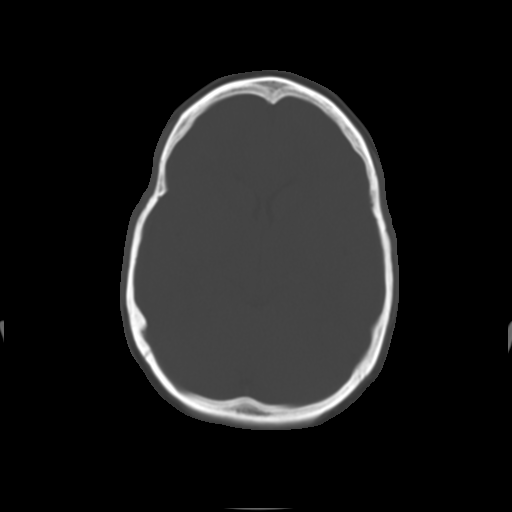
[im 14/32  brain]
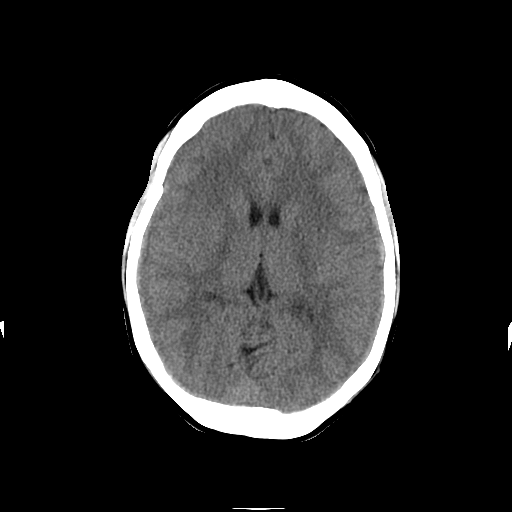
[im 16/32  brain]
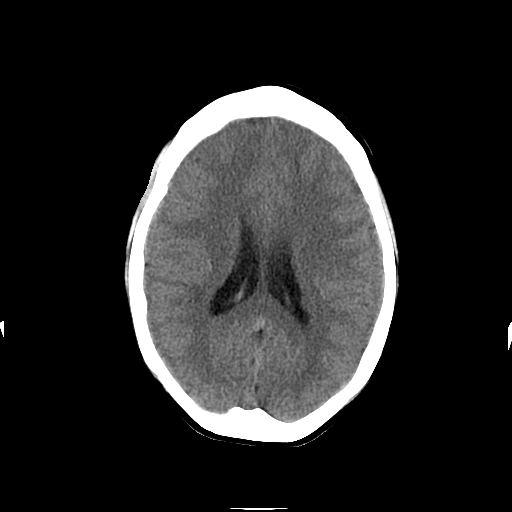
[im 18/32  brain]
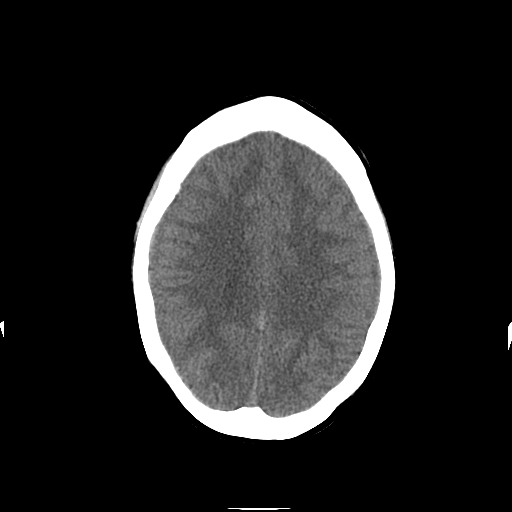
[im 20/32  brain]
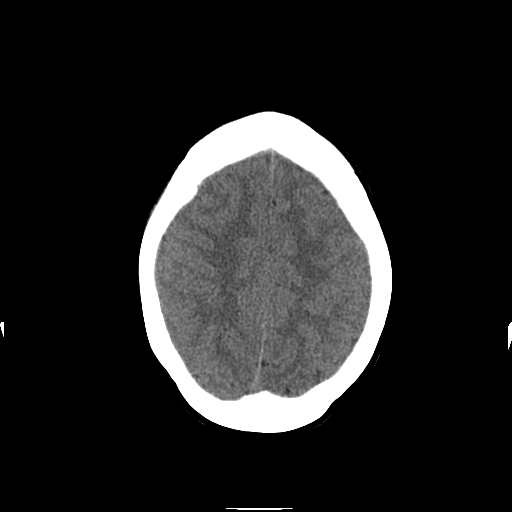
[im 20/32  bone]
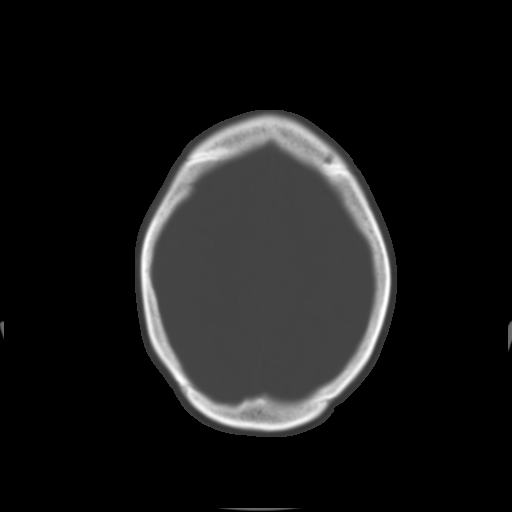
[im 23/32  brain]
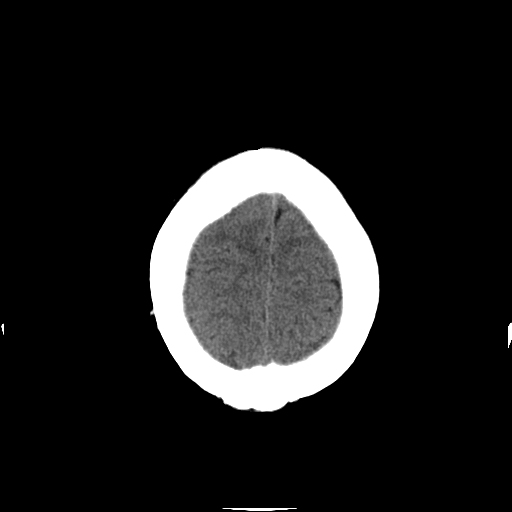
[im 25/32  brain]
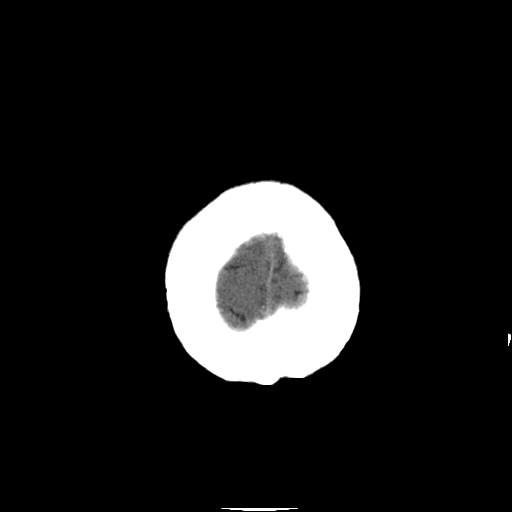
[im 27/32  brain]
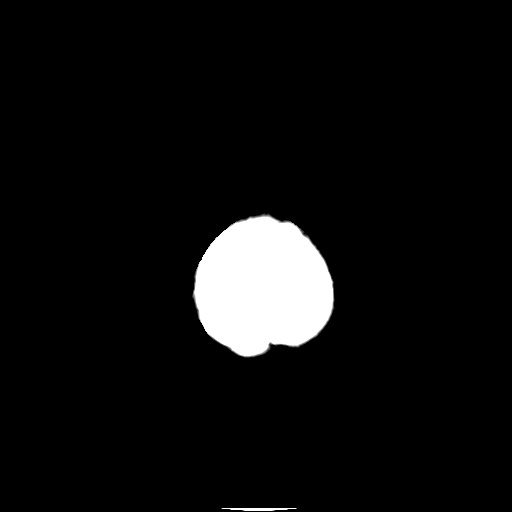
[im 29/32  brain]
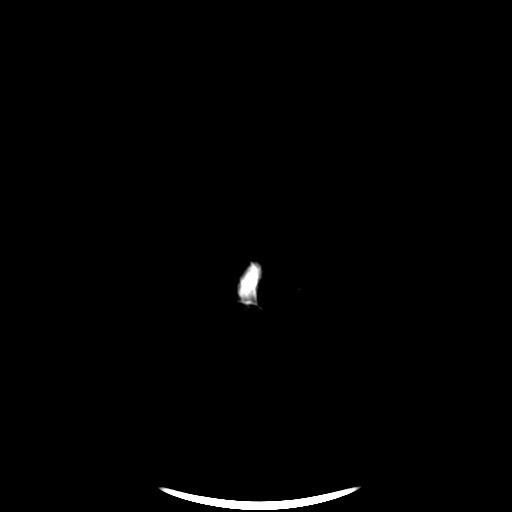
[im 29/32  bone]
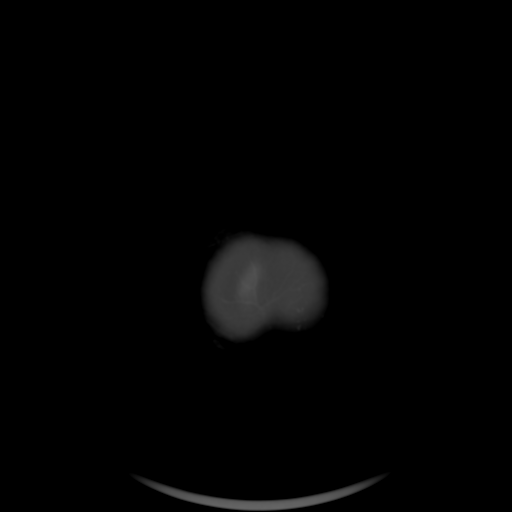

[Series 3: head w/o bone · axial · non-contrast · 0.49mm/px · z∈[+132,+178]mm · 3 of 32 slices shown]
[im 3/32  bone]
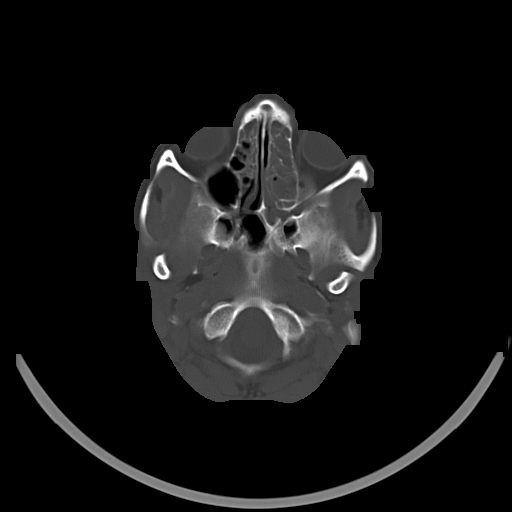
[im 7/32  bone]
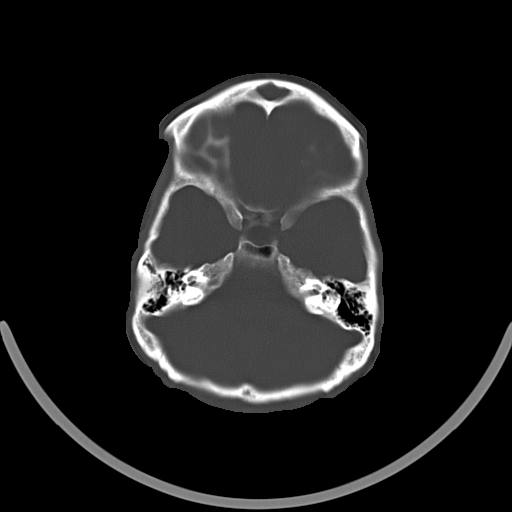
[im 12/32  bone]
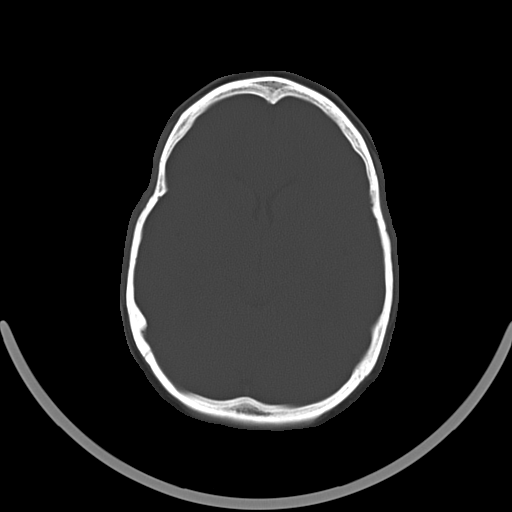

[16 of 30 positions shown; findings below may reference images not displayed]

FINDINGS: Minimal high attenuation artifact adjacent to inner table of
calvarium bilaterally.
Normal ventricular morphology.
No midline shift or mass effect.
Normal appearance of brain parenchyma.
No intracranial hemorrhage, mass lesion or evidence of acute
infarction.
Mucosal thickening right ethmoid air cells and sphenoid sinus with
complete opacification of the frontal sinus and left ethmoid air
cells.
Air fluid level left maxillary sinus.
IMPRESSION: No acute intracranial abnormalities.
Sinusitis changes as above.

## 2011-10-06 ENCOUNTER — Emergency Department (HOSPITAL_COMMUNITY)
Admission: EM | Admit: 2011-10-06 | Discharge: 2011-10-06 | Disposition: A | Discharge status: Home / Self Care | Payer: Medicaid Other | Attending: Emergency Medicine | Admitting: Emergency Medicine

## 2011-10-06 DIAGNOSIS — L298 Other pruritus: Secondary | ICD-10-CM | POA: Insufficient documentation

## 2011-10-06 DIAGNOSIS — O99019 Anemia complicating pregnancy, unspecified trimester: Secondary | ICD-10-CM | POA: Insufficient documentation

## 2011-10-06 DIAGNOSIS — L2989 Other pruritus: Secondary | ICD-10-CM | POA: Insufficient documentation

## 2011-10-06 DIAGNOSIS — D649 Anemia, unspecified: Secondary | ICD-10-CM

## 2011-10-06 DIAGNOSIS — R21 Rash and other nonspecific skin eruption: Secondary | ICD-10-CM | POA: Insufficient documentation

## 2011-10-06 DIAGNOSIS — L299 Pruritus, unspecified: Secondary | ICD-10-CM

## 2011-10-06 LAB — POCT I-STAT, CHEM 8
Creatinine, Ser: 0.6 mg/dL (ref 0.50–1.10)
HCT: 27 % — ABNORMAL LOW (ref 36.0–46.0)
Hemoglobin: 9.2 g/dL — ABNORMAL LOW (ref 12.0–15.0)
Potassium: 3.7 mEq/L (ref 3.5–5.1)
Sodium: 137 mEq/L (ref 135–145)
TCO2: 25 mmol/L (ref 0–100)

## 2011-10-06 MED ORDER — PRENATAL COMPLETE 14-0.4 MG PO TABS: 1.0000 | ORAL_TABLET | Freq: Every day | ORAL | Status: DC

## 2011-10-06 NOTE — ED Notes (Signed)
Pt reports rash all over for a month; especially on inner thighs; has not taken OTC meds for it. No one in house reports any symptoms.

## 2011-10-06 NOTE — ED Notes (Signed)
PT is six months pregnant.

## 2011-10-06 NOTE — Discharge Instructions (Signed)
Please followup with OB/GYN for your pregnancy. Please start taking a prenatal vitamin or multivitamin with iron for your anemia. Please have your blood tests rechecked in one week with your OB/GYN doctor. You may use a topical hydrocortisone cream twice daily for itching.   Iron Deficiency Anemia There are many types of anemia. Iron deficiency anemia is the most common. Iron deficiency anemia is a decrease in the number of red blood cells caused by too little iron. Without enough iron, your body does not produce enough hemoglobin. Hemoglobin is a substance in red blood cells that carries oxygen to the body's tissues. Iron deficiency anemia may leave you tired and short of breath. CAUSES   Lack of iron in the diet.   This may be seen in infants and children, because there is little iron in milk.   This may be seen in adults who do not eat enough iron-rich foods.   This may be seen in pregnant or breastfeeding women who do not take iron supplements. There is a much higher need for iron intake at these times.   Poor absorption of iron, as seen with intestinal disorders.   Intestinal bleeding.   Heavy periods.  SYMPTOMS  Mild anemia may not be noticeable. Symptoms may include:  Fatigue.   Headache.   Pale skin.   Weakness.   Shortness of breath.   Dizziness.   Cold hands and feet.   Fast or irregular heartbeat.  DIAGNOSIS  Diagnosis requires a thorough evaluation and physical exam by your caregiver.  Blood tests are generally used to confirm iron deficiency anemia.   Additional tests may be done to find the underlying cause of your anemia. These may include:   Testing for blood in the stool (fecal occult blood test).   A procedure to see inside the colon and rectum (colonoscopy).   A procedure to see inside the esophagus and stomach (endoscopy).  TREATMENT   Correcting the cause of the iron deficiency is the first step.   Medicines, such as oral contraceptives, can  make heavy menstrual flows lighter.   Antibiotics and other medicines can be used to treat peptic ulcers.   Surgery may be needed to remove a bleeding polyp, tumor, or fibroid.   Often, iron supplements (ferrous sulfate) are taken.   For the best iron absorption, take these supplements with an empty stomach.   You may need to take the supplements with food if you cannot tolerate them on an empty stomach. Vitamin C improves the absorption of iron. Your caregiver may recommend taking your iron tablets with a glass of orange juice or vitamin C supplement.   Milk and antacids should not be taken at the same time as iron supplements. They may interfere with the absorption of iron.   Iron supplements can cause constipation. A stool softener is often recommended.   Pregnant and breastfeeding women will need to take extra iron, because their normal diet usually will not provide the required amount.   Patients who cannot tolerate iron by mouth can take it through a vein (intravenously) or by an injection into the muscle.  HOME CARE INSTRUCTIONS   Ask your dietitian for help with diet questions.   Take iron and vitamins as directed by your caregiver.   Eat a diet rich in iron. Eat liver, lean beef, whole-grain bread, eggs, dried fruit, and dark green leafy vegetables.  SEEK IMMEDIATE MEDICAL CARE IF:   You have a fainting episode. Do not drive yourself. Call  your local emergency services (911 in [place] if no other help is available.   You have chest pain, nausea, or vomiting.   You develop severe or increased shortness of breath with activities.   You develop weakness or increased thirst.   You have a rapid heartbeat.   You develop unexplained sweating or become lightheaded when getting up from a chair or bed.  MAKE SURE YOU:   Understand these instructions.   Will watch your condition.   Will get help right away if you are not doing well or get worse.  Document Released:  05/27/2000 Document Revised: 05/19/2011 Document Reviewed: 10/06/2009 [facility] Patient Information 2012 [place], [place].   Pruritus  Pruritis is an itch. There are many different problems that can cause an itch. Dry skin is one of the most common causes of itching. Most cases of itching do not require medical attention.  HOME CARE INSTRUCTIONS  Make sure your skin is moistened on a regular basis. A moisturizer that contains petroleum jelly is best for keeping moisture in your skin. If you develop a rash, you may try the following for relief:   Use corticosteroid cream.   Apply cool compresses to the affected areas.   Bathe with Epsom salts or baking soda in the bathwater.   Soak in colloidal oatmeal baths. These are available at your pharmacy.   Apply baking soda paste to the rash. Stir water into baking soda until it reaches a paste-like consistency.   Use an anti-itch lotion.   Take over-the-counter diphenhydramine medicine by mouth as the instructions direct.   Avoid scratching. Scratching may cause the rash to become infected. If itching is very bad, your caregiver may suggest prescription lotions or creams to lessen your symptoms.   Avoid hot showers, which can make itching worse. A cold shower may help with itching as long as you use a moisturizer after the shower.  SEEK MEDICAL CARE IF: The itching does not go away after several days. Document Released: 02/09/2011 Document Revised: 05/19/2011 Document Reviewed: 02/09/2011 [facility] Patient Information 2012 [place], [place].   RESOURCE GUIDE  Dental Problems  Patients with Medicaid: [facility] [phone] [address]                                           [phone] Phone:  [phone]                                                  Phone:  [phone]  If unable to pay or uninsured, contact:  [facility] or [facility]. to become qualified for the  adult dental clinic.  Chronic Pain Problems Contact [facility]  [phone] Patients need to be referred by their primary care doctor.  Insufficient Money for Medicine Contact [employer] Way:  call "211" or [facility] 508-173-2840.  No Primary Care Doctor Call [facility]  938-735-2534 Other agencies that provide inexpensive medical care    [facility]  [phone]    [facility]  [phone]    [facility]  [phone]    Women's Clinic  [phone]    [facility]  ([phone]    [facility]  [phone]  Psychological Services [facility]  [phone] [facility]  [phone] [facility]   [phone] (emergency services ([phone])  Substance Abuse Resources [facility]  [phone] [facility] [phone] [facility] [phone] Floydene Flock [phone] Residential & Outpatient Substance Abuse Program  747-858-4516  Abuse/Neglect [facility] Child Abuse Hotline 425-822-8924 [facility] Child Abuse Hotline 2032395340 (After Hours)  Emergency Shelter [facility] ([phone]  Maternity Homes [facility] 409-047-8939 [facility] 2261380974  MRSA Hotline #:   951-133-4493    [facility] Resources  [facility]     [employer]                          [facility] Dept. [address] [place]                       [address]      [address]  Blondell Reveal Phone:  [phone]                                   Phone:  [phone]                 Phone:  [phone]  [facility] Phone:  [phone]  [facility] Child Abuse Hotline (319) [phone] (After Hours)

## 2011-10-06 NOTE — ED Notes (Signed)
Pt denies any questions or pain upon discharge. 

## 2011-10-06 NOTE — ED Provider Notes (Signed)
History     CSN: [id number]  Arrival date & time 10/06/11  2010   First MD Initiated Contact with Patient 10/06/11 2114      Chief Complaint  Patient presents with  . Rash    HPI  History provided by the patient. Patient is a 21 year old G17 P3 female who currently believes she is 6 months pregnant who presents with generalized pruritus. She reports having itching primarily over upper extremities groin and abdomen area. Symptoms have been present for the past several weeks to month. Patient has not noticed any lesions or rashes skin. Symptoms seem worse in the evening at times. No one else in the family has similar symptoms. She has not been to any new locations. She denies any new foods, soaps, lotions, laundry detergents or clothing. Patient denies having similar symptoms previously. Patient denies any abdominal pain, vaginal bleeding, vaginal discharge, dysuria, urinary frequency or hematuria. Denies any fever, chills, sweats, headache or neck stiffness. She denies any swelling of the lips, tongue or throat. She denies any difficulty breathing shortness of breath. Symptoms are described as mild. Patient has not tried anything for her symptoms. She denies any other aggravating or alleviating factors.    Past Medical History  Diagnosis Date  . No pertinent past medical history     Past Surgical History  Procedure Date  . No past surgeries     Family History  Problem Relation Age of Onset  . Anesthesia problems Neg Hx     History  Substance Use Topics  . Smoking status: Former Smoker -- 0.2 packs/day    Types: Cigarettes    Quit date: 05/12/2011  . Smokeless tobacco: Never Used  . Alcohol Use: No    OB History    Grav Para Term Preterm Abortions TAB SAB Ect Mult Living   4 3 3  0 0 0 0 0 0 3      Review of Systems  Constitutional: Negative for fever and chills.  Gastrointestinal: Negative for nausea, vomiting and abdominal pain.  Genitourinary: Negative for dysuria,  frequency, hematuria, flank pain, vaginal bleeding and vaginal discharge.  Skin: Negative for rash.    Allergies  Review of patient's allergies indicates no known allergies.  Home Medications  No current outpatient prescriptions on file.  BP 113/63  Pulse 92  Temp(Src) 98 F (36.7 C) (Oral)  Resp 18  SpO2 100%  LMP 04/03/2011  Physical Exam  Nursing note and vitals reviewed. Constitutional: She is oriented to person, place, and time. She appears well-developed and well-nourished. No distress.  HENT:  Head: Normocephalic and atraumatic.  Cardiovascular: Normal rate and regular rhythm.   Pulmonary/Chest: Effort normal and breath sounds normal. No respiratory distress. She has no wheezes. She has no rales.  Abdominal: Soft. There is no tenderness.       Patient gravid with fundus beyond level of the belly button. Fetal movements present. Fetus with normal heart rate and appearance on bedside ultrasound.  Neurological: She is alert and oriented to person, place, and time.  Skin: Skin is warm and dry. No rash noted.       No rash seen. No erythema, macules or papules.  Psychiatric: She has a normal mood and affect. Her behavior is normal.    ED Course  Procedures   Results for orders placed during the hospital encounter of 10/06/11  POCT I-STAT, CHEM 8      Component Value Range   Sodium 137  135 - 145 (mEq/L)  Potassium 3.7  3.5 - 5.1 (mEq/L)   Chloride 103  96 - 112 (mEq/L)   BUN 8  6 - 23 (mg/dL)   Creatinine, Ser 1.61  0.50 - 1.10 (mg/dL)   Glucose, Bld 83  70 - 99 (mg/dL)   Calcium, Ion 0.96  1.12 - 1.32 (mmol/L)   TCO2 25  0 - 100 (mmol/L)   Hemoglobin 9.2 (*) 12.0 - 15.0 (g/dL)   HCT 04.5 (*) 40.9 - 46.0 (%)       1. Pruritus   2. Anemia       MDM  Patient seen and evaluated. Patient in no acute distress. Patient without abdominal pains, vaginal bleeding or vaginal discharge. Patient with positive fetal movements.        Angus Seller,  [place] 10/07/11 [phone]

## 2011-10-07 NOTE — ED Provider Notes (Signed)
Medical screening examination/treatment/procedure(s) were performed by non-physician practitioner and as supervising physician I was immediately available for consultation/collaboration.   Glynn Octave, MD 10/07/11 1036

## 2011-10-28 ENCOUNTER — Inpatient Hospital Stay (HOSPITAL_COMMUNITY)
Admission: AD | Admit: 2011-10-28 | Discharge: 2011-10-28 | Disposition: A | Discharge status: Home / Self Care | Payer: Medicaid Other | Source: Ambulatory Visit | Attending: Obstetrics & Gynecology | Admitting: Obstetrics & Gynecology

## 2011-10-28 ENCOUNTER — Encounter (HOSPITAL_COMMUNITY): Payer: Self-pay | Admitting: *Deleted

## 2011-10-28 DIAGNOSIS — O2390 Unspecified genitourinary tract infection in pregnancy, unspecified trimester: Secondary | ICD-10-CM | POA: Insufficient documentation

## 2011-10-28 DIAGNOSIS — N390 Urinary tract infection, site not specified: Secondary | ICD-10-CM | POA: Insufficient documentation

## 2011-10-28 DIAGNOSIS — B86 Scabies: Secondary | ICD-10-CM | POA: Insufficient documentation

## 2011-10-28 DIAGNOSIS — O0930 Supervision of pregnancy with insufficient antenatal care, unspecified trimester: Secondary | ICD-10-CM

## 2011-10-28 DIAGNOSIS — O99891 Other specified diseases and conditions complicating pregnancy: Secondary | ICD-10-CM | POA: Insufficient documentation

## 2011-10-28 LAB — URINE MICROSCOPIC-ADD ON

## 2011-10-28 LAB — URINALYSIS, ROUTINE W REFLEX MICROSCOPIC
Ketones, ur: NEGATIVE mg/dL
Nitrite: NEGATIVE
Protein, ur: 30 mg/dL — AB
Specific Gravity, Urine: >1.03 — ABNORMAL HIGH (ref 1.005–1.030)
Urobilinogen, UA: 4 mg/dL — ABNORMAL HIGH (ref 0.0–1.0)

## 2011-10-28 LAB — RUBELLA SCREEN: Rubella: 314 IU/mL — ABNORMAL HIGH

## 2011-10-28 LAB — WET PREP, GENITAL: Trich, Wet Prep: NONE SEEN

## 2011-10-28 LAB — DIFFERENTIAL
Basophils Absolute: 0 10*3/uL (ref 0.0–0.1)
Basophils Relative: 0 % (ref 0–1)
Eosinophils Absolute: 0.1 10*3/uL (ref 0.0–0.7)
Monocytes Relative: 8 % (ref 3–12)
Neutrophils Relative %: 68 % (ref 43–77)

## 2011-10-28 LAB — RPR: RPR Ser Ql: NONREACTIVE

## 2011-10-28 LAB — CBC
Hemoglobin: 8.8 g/dL — ABNORMAL LOW (ref 12.0–15.0)
MCH: 27.8 pg (ref 26.0–34.0)
MCHC: 33.1 g/dL (ref 30.0–36.0)
RDW: 12.7 % (ref 11.5–15.5)

## 2011-10-28 LAB — TYPE AND SCREEN: ABO/RH(D): O POS

## 2011-10-28 LAB — HEPATITIS B SURFACE ANTIGEN: Hepatitis B Surface Ag: NEGATIVE

## 2011-10-28 MED ORDER — CEPHALEXIN 500 MG PO CAPS: 500.0000 mg | ORAL_CAPSULE | Freq: Four times a day (QID) | ORAL | Status: AC

## 2011-10-28 MED ORDER — PERMETHRIN 5 % EX CREA: TOPICAL_CREAM | Freq: Once | CUTANEOUS | Status: AC

## 2011-10-28 NOTE — MAU Provider Note (Signed)
History     CSN: [id number]  Arrival date and time: 10/28/11 1220   None     No chief complaint on file.  HPI 21 yo [id number] at 29.5 weeks with no prenatal care who is seen for complaints of generalized itching on her arms, hands, legs that started about 1 month ago.  Had a friend sleep over who had similar symptoms.  Partner also has itching.  Starts as pustule.  No palliating or provoking factors.   Had previous dx of BV but has not had treatment as could not afford antibiotics.  Still having symptoms of discharge.  Past Medical History  Diagnosis Date  . No pertinent past medical history     Past Surgical History  Procedure Date  . No past surgeries     Family History  Problem Relation Age of Onset  . Anesthesia problems Neg Hx     History  Substance Use Topics  . Smoking status: Former Smoker -- 0.2 packs/day    Types: Cigarettes    Quit date: 05/12/2011  . Smokeless tobacco: Never Used  . Alcohol Use: No    Allergies: No Known Allergies  No prescriptions prior to admission    Review of Systems  Constitutional: Negative for fever and chills.  Gastrointestinal: Negative for nausea, vomiting and abdominal pain.   Physical Exam   Blood pressure 111/54, pulse 110, temperature 98.8 F (37.1 C), temperature source Oral, resp. rate 16, height 5\' 1"  (1.549 m), weight 55.611 kg (122 lb 9.6 oz), last menstrual period 04/03/2011, SpO2 99.00%.  Physical Exam  Constitutional: She is oriented to person, place, and time. She appears well-developed and well-nourished.  HENT:  Head: Normocephalic and atraumatic.  GI: Soft. She exhibits no distension and no mass. There is no tenderness. There is no rebound and no guarding.  Neurological: She is alert and oriented to person, place, and time.  Skin: Skin is warm and dry.       Scabbed lesions on arms and in webs between fingers  Psychiatric: She has a normal mood and affect. Her behavior is normal. Judgment and thought  content normal.   Results for orders placed during the hospital encounter of 10/28/11 (from the past 24 hour(s))  TYPE AND SCREEN     Status: Normal (Preliminary result)   Collection Time   10/28/11  1:13 PM      Component Value Range   ABO/RH(D) O POS     Antibody Screen PENDING     Sample Expiration 10/31/2011    CBC     Status: Abnormal   Collection Time   10/28/11  1:14 PM      Component Value Range   WBC 7.3  4.0 - 10.5 (K/uL)   RBC 3.17 (*) 3.87 - 5.11 (MIL/uL)   Hemoglobin 8.8 (*) 12.0 - 15.0 (g/dL)   HCT 47.8 (*) 29.5 - 46.0 (%)   MCV 83.9  78.0 - 100.0 (fL)   MCH 27.8  26.0 - 34.0 (pg)   MCHC 33.1  30.0 - 36.0 (g/dL)   RDW 62.1  30.8 - 65.7 (%)   Platelets 211  150 - 400 (K/uL)  DIFFERENTIAL     Status: Normal   Collection Time   10/28/11  1:14 PM      Component Value Range   Neutrophils Relative 68  43 - 77 (%)   Neutro Abs 5.0  1.7 - 7.7 (K/uL)   Lymphocytes Relative 23  12 - 46 (%)   Lymphs  Abs 1.7  0.7 - 4.0 (K/uL)   Monocytes Relative 8  3 - 12 (%)   Monocytes Absolute 0.6  0.1 - 1.0 (K/uL)   Eosinophils Relative 1  0 - 5 (%)   Eosinophils Absolute 0.1  0.0 - 0.7 (K/uL)   Basophils Relative 0  0 - 1 (%)   Basophils Absolute 0.0  0.0 - 0.1 (K/uL)  RAPID HIV SCREEN ([facility])     Status: Normal   Collection Time   10/28/11  1:14 PM      Component Value Range   SUDS Rapid HIV Screen NON REACTIVE  NON REACTIVE   URINALYSIS, ROUTINE W REFLEX MICROSCOPIC     Status: Abnormal   Collection Time   10/28/11  1:51 PM      Component Value Range   Color, Urine AMBER (*) YELLOW    APPearance CLOUDY (*) CLEAR    Specific Gravity, Urine >1.030 (*) 1.005 - 1.030    pH 6.0  5.0 - 8.0    Glucose, UA NEGATIVE  NEGATIVE (mg/dL)   Hgb urine dipstick NEGATIVE  NEGATIVE    Bilirubin Urine SMALL (*) NEGATIVE    Ketones, ur NEGATIVE  NEGATIVE (mg/dL)   Protein, ur 30 (*) NEGATIVE (mg/dL)   Urobilinogen, UA 4.0 (*) 0.0 - 1.0 (mg/dL)   Nitrite NEGATIVE  NEGATIVE    Leukocytes, UA  MODERATE (*) NEGATIVE   URINE MICROSCOPIC-ADD ON     Status: Abnormal   Collection Time   10/28/11  1:51 PM      Component Value Range   Squamous Epithelial / LPF FEW (*) RARE    WBC, UA 21-50  <3 (WBC/hpf)   Bacteria, UA FEW (*) RARE    Urine-Other MUCOUS PRESENT    WET PREP, GENITAL     Status: Abnormal   Collection Time   10/28/11  2:15 PM      Component Value Range   Yeast Wet Prep HPF POC NONE SEEN  NONE SEEN    Trich, Wet Prep NONE SEEN  NONE SEEN    Clue Cells Wet Prep HPF POC NONE SEEN  NONE SEEN    WBC, Wet Prep HPF POC FEW (*) NONE SEEN      MAU Course  Procedures   Assessment and Plan  1.  Scabies - permethrin cream 2.  UTI - keflex four times daily x 1 week 4.  Insufficient prenatal care - referred to [facility].  Monique Harris Monique Harris 10/28/2011, 2:26 PM

## 2011-10-28 NOTE — Discharge Instructions (Signed)
[facility] [address] [place], [place] [address] Phone: [phone] Fax: [phone]   Scabies Scabies are small bugs (mites) that burrow under the skin and cause red bumps and severe itching. These bugs can only be seen with a microscope. Scabies are highly contagious. They can spread easily from person to person by direct contact. They are also spread through sharing clothing or linens that have the scabies mites living in them. It is not unusual for an entire family to become infected through shared towels, clothing, or bedding.  HOME CARE INSTRUCTIONS   Your caregiver may prescribe a cream or lotion to kill the mites. If this cream is prescribed; massage the cream into the entire area of the body from the neck to the bottom of both feet. Also massage the cream into the scalp and face if your child is less than 62 year old. Avoid the eyes and mouth.   Leave the cream on for 8 to12 hours. Do not wash your hands after application. Your child should bathe or shower after the 8 to 12 hour application period. Sometimes it is helpful to apply the cream to your child at right before bedtime.   One treatment is usually effective and will eliminate approximately 95% of infestations. For severe cases, your caregiver may decide to repeat the treatment in 1 week. Everyone in your household should be treated with one application of the cream.   New rashes or burrows should not appear after successful treatment within 24 to 48 hours; however the itching and rash may last for 2 to 4 weeks after successful treatment. If your symptoms persist longer than this, see your caregiver.   Your caregiver also may prescribe a medication to help with the itching or to help the rash go away more quickly.   Scabies can live on clothing or linens for up to 3 days. Your entire child's recently used clothing, towels, stuffed toys, and bed linens should be washed in hot water and then dried in a dryer  for at least 20 minutes on high heat. Items that cannot be washed should be enclosed in a plastic bag for at least 3 days.   To help relieve itching, bathe your child in a cool bath or apply cool washcloths to the affected areas.   Your child may return to school after treatment with the prescribed cream.  SEEK MEDICAL CARE IF:   The itching persists longer than 4 weeks after treatment.   The rash spreads or becomes infected (the area has red blisters or yellow-tan crust).  Document Released: 05/30/2005 Document Revised: 05/19/2011 Document Reviewed: 10/08/2008 [facility] Patient Information 2012 [place], [place].

## 2011-10-28 NOTE — MAU Note (Signed)
Patient states she has had no prenatal care and wants to check on the baby Not having any problems, no leaking or bleeding and reports good fetal movement.

## 2011-10-29 LAB — SICKLE CELL SCREEN: Sickle Cell Screen: NEGATIVE

## 2011-11-16 ENCOUNTER — Ambulatory Visit (HOSPITAL_COMMUNITY)
Admission: RE | Admit: 2011-11-16 | Discharge: 2011-11-16 | Disposition: A | Discharge status: Home / Self Care | Payer: Medicaid Other | Source: Ambulatory Visit | Attending: Family Medicine | Admitting: Family Medicine

## 2011-11-16 ENCOUNTER — Encounter: Payer: Self-pay | Admitting: Family Medicine

## 2011-11-16 ENCOUNTER — Ambulatory Visit (INDEPENDENT_AMBULATORY_CARE_PROVIDER_SITE_OTHER): Payer: Medicaid Other | Admitting: Family Medicine

## 2011-11-16 VITALS — BP 92/54 | Temp 98.3°F | Ht 63.0 in | Wt 124.1 lb

## 2011-11-16 DIAGNOSIS — Z3689 Encounter for other specified antenatal screening: Secondary | ICD-10-CM | POA: Insufficient documentation

## 2011-11-16 DIAGNOSIS — O0930 Supervision of pregnancy with insufficient antenatal care, unspecified trimester: Secondary | ICD-10-CM | POA: Insufficient documentation

## 2011-11-16 LAB — POCT URINALYSIS DIP (DEVICE)
Glucose, UA: NEGATIVE mg/dL
Nitrite: NEGATIVE
Urobilinogen, UA: 1 mg/dL (ref 0.0–1.0)

## 2011-11-16 LAB — GLUCOSE TOLERANCE, 1 HOUR: Glucose, 1 Hour GTT: 95 mg/dL (ref 70–140)

## 2011-11-16 MED ORDER — PRENATAL VITAMINS 0.8 MG PO TABS: 1.0000 | ORAL_TABLET | Freq: Every day | ORAL | Status: DC

## 2011-11-16 MED ORDER — CEPHALEXIN 500 MG PO CAPS: 500.0000 mg | ORAL_CAPSULE | Freq: Four times a day (QID) | ORAL | Status: DC

## 2011-11-16 NOTE — Progress Notes (Signed)
   Subjective:    Monique Harris is a [id number] [redacted]w[redacted]d being seen today for her first obstetrical visit.  Her obstetrical history is significant for late to prenatal care. Patient does not intend to breast feed. Pregnancy history fully reviewed.  Patient reports no bleeding, no contractions and no cramping.  Filed Vitals:   11/16/11 0948 11/16/11 0950  BP: 92/54   Temp: 98.3 F (36.8 C)   Height:  5\' 3"  (1.6 m)  Weight: 124 lb 1.6 oz (56.291 kg)     HISTORY: OB History    Grav Para Term Preterm Abortions TAB SAB Ect Mult Living   4 3 3  0 0 0 0 0 0 3     # Outc Date GA Lbr Len/2nd Wgt Sex Del Anes PTL Lv   1 TRM 3/08 [redacted]w[redacted]d  6lb(2.722kg) F SVD EPI  Yes   2 TRM 2/09 [redacted]w[redacted]d  6lb5oz(2.863kg) M SVD EPI  Yes   3 TRM 3/11 [redacted]w[redacted]d  7lb(3.175kg) F SVD EPI  Yes   4 CUR              Past Medical History  Diagnosis Date  . No pertinent past medical history    Past Surgical History  Procedure Date  . No past surgeries    Family History  Problem Relation Age of Onset  . Anesthesia problems Neg Hx      Exam    Uterus:     System:     Skin: normal coloration and turgor, no rashes    Neurologic: oriented, normal, grossly non-focal   Extremities: normal strength, tone, and muscle mass   HEENT PERRLA and extra ocular movement intact   Mouth/Teeth mucous membranes moist, pharynx normal without lesions   Neck supple   Cardiovascular: regular rate and rhythm, no murmurs or gallops   Respiratory:  appears well, vitals normal, no respiratory distress, acyanotic, normal RR, ear and throat exam is normal, neck free of mass or lymphadenopathy, chest clear, no wheezing, crepitations, rhonchi, normal symmetric air entry   Abdomen: soft, non-tender; bowel sounds normal; no masses,  no organomegaly          Assessment:    Pregnancy: [id number] Patient Active Problem List  Diagnoses  . Insufficient prenatal care        Plan:     Initial labs drawn. Prenatal vitamins. Problem list  reviewed and updated.  Ultrasound discussed; fetal survey: ordered.  Follow up in 2 weeks. 50% of 45 min visit spent on counseling and coordination of care.     Candelaria Celeste JEHIEL 11/16/2011

## 2011-11-16 NOTE — Progress Notes (Signed)
Pulse 80. 1 hour gtt due at 10:50

## 2011-11-16 NOTE — Patient Instructions (Signed)
Pregnancy - Third Trimester The third trimester of pregnancy (the last 3 months) is a period of the most rapid growth for you and your baby. The baby approaches a length of 20 inches and a weight of 6 to 10 pounds. The baby is adding on fat and getting ready for life outside your body. While inside, babies have periods of sleeping and waking, suck their thumbs, and hiccups. You can often feel small contractions of the uterus. This is false labor. It is also called Braxton-Hicks contractions. This is like a practice for labor. The usual problems in this stage of pregnancy include more difficulty breathing, swelling of the hands and feet from water retention, and having to urinate more often because of the uterus and baby pressing on your bladder.  PRENATAL EXAMS  Blood work may continue to be done during prenatal exams. These tests are done to check on your health and the probable health of your baby. Blood work is used to follow your blood levels (hemoglobin). Anemia (low hemoglobin) is common during pregnancy. Iron and vitamins are given to help prevent this. You may also continue to be checked for diabetes. Some of the past blood tests may be done again.   The size of the uterus is measured during each visit. This makes sure your baby is growing properly according to your pregnancy dates.   Your blood pressure is checked every prenatal visit. This is to make sure you are not getting toxemia.   Your urine is checked every prenatal visit for infection, diabetes and protein.   Your weight is checked at each visit. This is done to make sure gains are happening at the suggested rate and that you and your baby are growing normally.   Sometimes, an ultrasound is performed to confirm the position and the proper growth and development of the baby. This is a test done that bounces harmless sound waves off the baby so your caregiver can more accurately determine due dates.   Discuss the type of pain  medication and anesthesia you will have during your labor and delivery.   Discuss the possibility and anesthesia if a Cesarean Section might be necessary.   Inform your caregiver if there is any mental or physical violence at home.  Sometimes, a specialized non-stress test, contraction stress test and biophysical profile are done to make sure the baby is not having a problem. Checking the amniotic fluid surrounding the baby is called an amniocentesis. The amniotic fluid is removed by sticking a needle into the belly (abdomen). This is sometimes done near the end of pregnancy if an early delivery is required. In this case, it is done to help make sure the baby's lungs are mature enough for the baby to live outside of the womb. If the lungs are not mature and it is unsafe to deliver the baby, an injection of cortisone medication is given to the mother 1 to 2 days before the delivery. This helps the baby's lungs mature and makes it safer to deliver the baby. CHANGES OCCURING IN THE THIRD TRIMESTER OF PREGNANCY Your body goes through many changes during pregnancy. They vary from person to person. Talk to your caregiver about changes you notice and are concerned about.  During the last trimester, you have probably had an increase in your appetite. It is normal to have cravings for certain foods. This varies from person to person and pregnancy to pregnancy.   You may begin to get stretch marks on your hips,   abdomen, and breasts. These are normal changes in the body during pregnancy. There are no exercises or medications to take which prevent this change.   Constipation may be treated with a stool softener or adding bulk to your diet. Drinking lots of fluids, fiber in vegetables, fruits, and whole grains are helpful.   Exercising is also helpful. If you have been very active up until your pregnancy, most of these activities can be continued during your pregnancy. If you have been less active, it is helpful  to start an exercise program such as walking. Consult your caregiver before starting exercise programs.   Avoid all smoking, alcohol, un-prescribed drugs, herbs and "street drugs" during your pregnancy. These chemicals affect the formation and growth of the baby. Avoid chemicals throughout the pregnancy to ensure the delivery of a healthy infant.   Backache, varicose veins and hemorrhoids may develop or get worse.   You will tire more easily in the third trimester, which is normal.   The baby's movements may be stronger and more often.   You may become short of breath easily.   Your belly button may stick out.   A yellow discharge may leak from your breasts called colostrum.   You may have a bloody mucus discharge. This usually occurs a few days to a week before labor begins.  HOME CARE INSTRUCTIONS   Keep your caregiver's appointments. Follow your caregiver's instructions regarding medication use, exercise, and diet.   During pregnancy, you are providing food for you and your baby. Continue to eat regular, well-balanced meals. Choose foods such as meat, fish, milk and other low fat dairy products, vegetables, fruits, and whole-grain breads and cereals. Your caregiver will tell you of the ideal weight gain.   A physical sexual relationship may be continued throughout pregnancy if there are no other problems such as early (premature) leaking of amniotic fluid from the membranes, vaginal bleeding, or belly (abdominal) pain.   Exercise regularly if there are no restrictions. Check with your caregiver if you are unsure of the safety of your exercises. Greater weight gain will occur in the last 2 trimesters of pregnancy. Exercising helps:   Control your weight.   Get you in shape for labor and delivery.   You lose weight after you deliver.   Rest a lot with legs elevated, or as needed for leg cramps or low back pain.   Wear a good support or jogging bra for breast tenderness during  pregnancy. This may help if worn during sleep. Pads or tissues may be used in the bra if you are leaking colostrum.   Do not use hot tubs, steam rooms, or saunas.   Wear your seat belt when driving. This protects you and your baby if you are in an accident.   Avoid raw meat, cat litter boxes and soil used by cats. These carry germs that can cause birth defects in the baby.   It is easier to loose urine during pregnancy. Tightening up and strengthening the pelvic muscles will help with this problem. You can practice stopping your urination while you are going to the bathroom. These are the same muscles you need to strengthen. It is also the muscles you would use if you were trying to stop from passing gas. You can practice tightening these muscles up 10 times a set and repeating this about 3 times per day. Once you know what muscles to tighten up, do not perform these exercises during urination. It is more likely   to cause an infection by backing up the urine.   Ask for help if you have financial, counseling or nutritional needs during pregnancy. Your caregiver will be able to offer counseling for these needs as well as refer you for other special needs.   Make a list of emergency phone numbers and have them available.   Plan on getting help from family or friends when you go home from the hospital.   Make a trial run to the hospital.   Take prenatal classes with the father to understand, practice and ask questions about the labor and delivery.   Prepare the baby's room/nursery.   Do not travel out of the city unless it is absolutely necessary and with the advice of your caregiver.   Wear only low or no heal shoes to have better balance and prevent falling.  MEDICATIONS AND DRUG USE IN PREGNANCY  Take prenatal vitamins as directed. The vitamin should contain 1 milligram of folic acid. Keep all vitamins out of reach of children. Only a couple vitamins or tablets containing iron may be fatal  to a baby or young child when ingested.   Avoid use of all medications, including herbs, over-the-counter medications, not prescribed or suggested by your caregiver. Only take over-the-counter or prescription medicines for pain, discomfort, or fever as directed by your caregiver. Do not use aspirin, ibuprofen (Motrin, Advil, Nuprin) or naproxen (Aleve) unless OK'd by your caregiver.   Let your caregiver also know about herbs you may be using.   Alcohol is related to a number of birth defects. This includes fetal alcohol syndrome. All alcohol, in any form, should be avoided completely. Smoking will cause low birth rate and premature babies.   Street/illegal drugs are very harmful to the baby. They are absolutely forbidden. A baby born to an addicted mother will be addicted at birth. The baby will go through the same withdrawal an adult does.  SEEK MEDICAL CARE IF: You have any concerns or worries during your pregnancy. It is better to call with your questions if you feel they cannot wait, rather than worry about them. DECISIONS ABOUT CIRCUMCISION You may or may not know the sex of your baby. If you know your baby is a boy, it may be time to think about circumcision. Circumcision is the removal of the foreskin of the penis. This is the skin that covers the sensitive end of the penis. There is no proven medical need for this. Often this decision is made on what is popular at the time or based upon religious beliefs and social issues. You can discuss these issues with your caregiver or pediatrician. SEEK IMMEDIATE MEDICAL CARE IF:   An unexplained oral temperature above 102 F (38.9 C) develops, or as your caregiver suggests.   You have leaking of fluid from the vagina (birth canal). If leaking membranes are suspected, take your temperature and tell your caregiver of this when you call.   There is vaginal spotting, bleeding or passing clots. Tell your caregiver of the amount and how many pads are  used.   You develop a bad smelling vaginal discharge with a change in the color from clear to white.   You develop vomiting that lasts more than 24 hours.   You develop chills or fever.   You develop shortness of breath.   You develop burning on urination.   You loose more than 2 pounds of weight or gain more than 2 pounds of weight or as suggested by your   caregiver.   You notice sudden swelling of your face, hands, and feet or legs.   You develop belly (abdominal) pain. Round ligament discomfort is a common non-cancerous (benign) cause of abdominal pain in pregnancy. Your caregiver still must evaluate you.   You develop a severe headache that does not go away.   You develop visual problems, blurred or double vision.   If you have not felt your baby move for more than 1 hour. If you think the baby is not moving as much as usual, eat something with sugar in it and lie down on your left side for an hour. The baby should move at least 4 to 5 times per hour. Call right away if your baby moves less than that.   You fall, are in a car accident or any kind of trauma.   There is mental or physical violence at home.  Document Released: 05/24/2001 Document Revised: 05/19/2011 Document Reviewed: 11/26/2008 [employer] Patient Information 2012 [employer]. 

## 2011-11-17 LAB — CULTURE, OB URINE: Colony Count: 30000

## 2011-11-21 ENCOUNTER — Encounter: Payer: Self-pay | Admitting: Family Medicine

## 2011-11-24 LAB — POCT URINALYSIS DIP (DEVICE)
Ketones, ur: NEGATIVE mg/dL
Protein, ur: NEGATIVE mg/dL
Urobilinogen, UA: 0.2 mg/dL (ref 0.0–1.0)
pH: 6 (ref 5.0–8.0)

## 2011-11-26 ENCOUNTER — Encounter (HOSPITAL_COMMUNITY): Payer: Self-pay | Admitting: *Deleted

## 2011-11-26 ENCOUNTER — Observation Stay (HOSPITAL_COMMUNITY): Payer: Medicaid Other

## 2011-11-26 ENCOUNTER — Observation Stay (HOSPITAL_COMMUNITY)
Admission: EM | Admit: 2011-11-26 | Discharge: 2011-11-27 | Disposition: A | Discharge status: Home / Self Care | Payer: Medicaid Other | Attending: Obstetrics & Gynecology | Admitting: Obstetrics & Gynecology

## 2011-11-26 DIAGNOSIS — A499 Bacterial infection, unspecified: Secondary | ICD-10-CM | POA: Insufficient documentation

## 2011-11-26 DIAGNOSIS — N760 Acute vaginitis: Secondary | ICD-10-CM | POA: Insufficient documentation

## 2011-11-26 DIAGNOSIS — Y92009 Unspecified place in unspecified non-institutional (private) residence as the place of occurrence of the external cause: Secondary | ICD-10-CM | POA: Insufficient documentation

## 2011-11-26 DIAGNOSIS — Z3490 Encounter for supervision of normal pregnancy, unspecified, unspecified trimester: Secondary | ICD-10-CM

## 2011-11-26 DIAGNOSIS — Y9389 Activity, other specified: Secondary | ICD-10-CM | POA: Insufficient documentation

## 2011-11-26 DIAGNOSIS — O4700 False labor before 37 completed weeks of gestation, unspecified trimester: Principal | ICD-10-CM | POA: Insufficient documentation

## 2011-11-26 DIAGNOSIS — O0930 Supervision of pregnancy with insufficient antenatal care, unspecified trimester: Secondary | ICD-10-CM | POA: Insufficient documentation

## 2011-11-26 DIAGNOSIS — B9689 Other specified bacterial agents as the cause of diseases classified elsewhere: Secondary | ICD-10-CM | POA: Insufficient documentation

## 2011-11-26 DIAGNOSIS — O9A219 Injury, poisoning and certain other consequences of external causes complicating pregnancy, unspecified trimester: Secondary | ICD-10-CM | POA: Diagnosis present

## 2011-11-26 DIAGNOSIS — S3981XA Other specified injuries of abdomen, initial encounter: Secondary | ICD-10-CM

## 2011-11-26 DIAGNOSIS — A5901 Trichomonal vulvovaginitis: Secondary | ICD-10-CM | POA: Insufficient documentation

## 2011-11-26 DIAGNOSIS — O98819 Other maternal infectious and parasitic diseases complicating pregnancy, unspecified trimester: Secondary | ICD-10-CM | POA: Insufficient documentation

## 2011-11-26 DIAGNOSIS — W010XXA Fall on same level from slipping, tripping and stumbling without subsequent striking against object, initial encounter: Secondary | ICD-10-CM | POA: Insufficient documentation

## 2011-11-26 DIAGNOSIS — R109 Unspecified abdominal pain: Secondary | ICD-10-CM | POA: Insufficient documentation

## 2011-11-26 DIAGNOSIS — S3991XA Unspecified injury of abdomen, initial encounter: Secondary | ICD-10-CM

## 2011-11-26 DIAGNOSIS — R079 Chest pain, unspecified: Secondary | ICD-10-CM | POA: Insufficient documentation

## 2011-11-26 DIAGNOSIS — A599 Trichomoniasis, unspecified: Secondary | ICD-10-CM

## 2011-11-26 DIAGNOSIS — W19XXXA Unspecified fall, initial encounter: Secondary | ICD-10-CM

## 2011-11-26 DIAGNOSIS — O2390 Unspecified genitourinary tract infection in pregnancy, unspecified trimester: Secondary | ICD-10-CM | POA: Insufficient documentation

## 2011-11-26 LAB — URINALYSIS, ROUTINE W REFLEX MICROSCOPIC
Bilirubin Urine: NEGATIVE
Nitrite: NEGATIVE
Specific Gravity, Urine: 1.014 (ref 1.005–1.030)
Urobilinogen, UA: 1 mg/dL (ref 0.0–1.0)

## 2011-11-26 LAB — DIFFERENTIAL
Basophils Absolute: 0 10*3/uL (ref 0.0–0.1)
Basophils Relative: 0 % (ref 0–1)
Eosinophils Relative: 2 % (ref 0–5)
Monocytes Absolute: 0.8 10*3/uL (ref 0.1–1.0)
Neutro Abs: 5.4 10*3/uL (ref 1.7–7.7)

## 2011-11-26 LAB — WET PREP, GENITAL: Trich, Wet Prep: NONE SEEN

## 2011-11-26 LAB — POCT I-STAT, CHEM 8
Calcium, Ion: 1.2 mmol/L (ref 1.12–1.32)
Glucose, Bld: 92 mg/dL (ref 70–99)
HCT: 25 % — ABNORMAL LOW (ref 36.0–46.0)
Hemoglobin: 8.5 g/dL — ABNORMAL LOW (ref 12.0–15.0)
Potassium: 3.6 mEq/L (ref 3.5–5.1)

## 2011-11-26 LAB — OB RESULTS CONSOLE GBS: GBS: POSITIVE

## 2011-11-26 LAB — CBC
HCT: 26.2 % — ABNORMAL LOW (ref 36.0–46.0)
MCHC: 33.6 g/dL (ref 30.0–36.0)
MCV: 83.4 fL (ref 78.0–100.0)
RDW: 13.4 % (ref 11.5–15.5)

## 2011-11-26 LAB — URINE MICROSCOPIC-ADD ON

## 2011-11-26 MED ORDER — FERROUS SULFATE 325 (65 FE) MG PO TABS
325.0000 mg | ORAL_TABLET | Freq: Two times a day (BID) | ORAL | Status: DC
  Administered 2011-11-26 – 2011-11-27 (×2): 325 mg via ORAL
  Filled 2011-11-26 (×2): qty 1

## 2011-11-26 MED ORDER — ZOLPIDEM TARTRATE 10 MG PO TABS: 10.0000 mg | ORAL_TABLET | Freq: Every evening | ORAL | Status: DC | PRN

## 2011-11-26 MED ORDER — ACETAMINOPHEN 325 MG PO TABS
650.0000 mg | ORAL_TABLET | Freq: Four times a day (QID) | ORAL | Status: DC | PRN
  Administered 2011-11-26: 650 mg via ORAL
  Filled 2011-11-26: qty 2

## 2011-11-26 MED ORDER — PRENATAL MULTIVITAMIN CH
1.0000 | ORAL_TABLET | Freq: Every day | ORAL | Status: DC
  Administered 2011-11-26 – 2011-11-27 (×2): 1 via ORAL
  Filled 2011-11-26 (×3): qty 1

## 2011-11-26 MED ORDER — ACETAMINOPHEN 325 MG PO TABS
650.0000 mg | ORAL_TABLET | ORAL | Status: DC | PRN
  Administered 2011-11-26 (×2): 650 mg via ORAL
  Filled 2011-11-26 (×2): qty 2

## 2011-11-26 MED ORDER — DOCUSATE SODIUM 100 MG PO CAPS
100.0000 mg | ORAL_CAPSULE | Freq: Every day | ORAL | Status: DC
  Administered 2011-11-27: 100 mg via ORAL
  Filled 2011-11-26 (×2): qty 1

## 2011-11-26 MED ORDER — CALCIUM CARBONATE ANTACID 500 MG PO CHEW
2.0000 | CHEWABLE_TABLET | ORAL | Status: DC | PRN
  Administered 2011-11-26: 400 mg via ORAL
  Filled 2011-11-26 (×2): qty 2

## 2011-11-26 MED ORDER — METRONIDAZOLE 500 MG PO TABS
2000.0000 mg | ORAL_TABLET | Freq: Once | ORAL | Status: AC
  Administered 2011-11-26: 2000 mg via ORAL
  Filled 2011-11-26: qty 4

## 2011-11-26 NOTE — H&P (Signed)
  Chief Complaint   Patient presents with   .  Abdominal Pain   .  Chest Pain    HPI Comments: Pt is 21 yo G4P3003 @ 33.6 who is s/p fall and evaluated at [facility] ED. She was mopping the floor and slipped landing on her forehead, sternum and abdomen. No bleeding, no loss of fluids. Feeling good fetal movement. Currently being seen at low risk for late prenatal care.    Chest pain resulted after striking handle of oven when slipping after mopping.  No SOB or pain at this time.  EKG completed at [facility] - negative, normal sinus rhythm.      Past Medical History   Diagnosis  Date   .  No pertinent past medical history    .  Pregnant     Past Surgical History   Procedure  Date   .  No past surgeries     Family History   Problem  Relation  Age of Onset   .  Anesthesia problems  Neg Hx    .  Other  Neg Hx     History   Substance Use Topics   .  Smoking status:  Former Smoker -- 0.2 packs/day     Types:  Cigarettes     Quit date:  05/12/2011   .  Smokeless tobacco:  Never Used   .  Alcohol Use:  No    Allergies: No Known Allergies   Review of Systems  Constitutional: Negative for fever and chills.  Eyes: Negative for blurred vision and double vision.  Respiratory: Negative for cough and hemoptysis.  Gastrointestinal: Negative for nausea and vomiting.  Genitourinary: Negative for dysuria, urgency, frequency and hematuria.  Neurological: Negative. Negative for headaches.  Psychiatric/Behavioral: Negative.   Physical Exam   Blood pressure 105/66, pulse 96, temperature 98.6 F (37 C), temperature source Oral, resp. rate 20, last menstrual period 04/03/2011, SpO2 100.00%.  Physical Exam  Constitutional: She is oriented to person, place, and time. She appears well-developed and well-nourished. No distress.  HENT:  Head: Normocephalic and atraumatic.  Mouth/Throat: No oropharyngeal exudate.  Diffuse early ecchymosis over frontal area, no battle sign,  Eyes: Right eye exhibits  no discharge. Left eye exhibits no discharge. No scleral icterus.  Neck: No JVD present. No tracheal deviation present. No thyromegaly present.  Cardiovascular: Normal rate and regular rhythm.  Respiratory: Effort normal and breath sounds normal. No stridor.  GI: Soft. She exhibits distension and mass. There is tenderness. There is no rebound and no guarding.  Gravid, minimal diffuse tenderness  GU +white frothy discharge, negative pooling, negative bleeding Cervix - closed FHR 130's, +accels, no decel Toco - irregular contractions Musculoskeletal: Normal range of motion. She exhibits no edema and no tenderness.  Neurological: She is alert and oriented to person, place, and time.  Skin: Skin is warm and dry. No rash noted. She is not diaphoretic. No erythema. No pallor.  Psychiatric: She has a normal mood and affect. Her behavior is normal. Judgment and thought content normal.   MAU Course   Procedures  MDM  UA without blood (incidental finding of Trichomonas)  GBS pending; GC/CT pending, wet prep Consult with Dr.  Macon Large - admit for observation Assessment and Plan   S/p Fall - No evidence of fetal distress  Irregular Contractions Trichimonas   Plan: Admit for observation Treat trichomoniasis  Anticipate DC in AM if no change in condition [facility]

## 2011-11-26 NOTE — MAU Note (Signed)
Pt slipped on mopped floor in kitchen, hit sternum & forehead on stove then landed on abdomen. Positive fetal movement. Intermittent abdominal pain. Denies vaginal bleeding.

## 2011-11-26 NOTE — ED Notes (Addendum)
C/o abd & chest pain, onset 1 hr ago, no meds PTA, onset while mopping, pt is pregnant and is due 7/28. Denies smoking. No OBGYN, no prenatal care. (denies: sob, fever, nvd, dizziness, couh, congestion or cold sx), also mentions pressure in bottom, last BM today (normal).

## 2011-11-26 NOTE — ED Notes (Signed)
OB RRT RN paged.

## 2011-11-26 NOTE — Progress Notes (Signed)
Chief Complaint   Patient presents with   .  Abdominal Pain   .  Chest Pain    HPI Comments: Pt is 21 yo G4P3003 @ 33.6 who is s/p fall and evaluated at [facility] ED. She was mopping the floor and slipped landing on her forehead, sternum and abdomen. No bleeding, no loss of fluids. Feeling good fetal movement.   Chest pain resulted after striking handle of oven when slipping after mopping.  No SOB or pain at this time.  EKG completed at [facility] - negative, normal sinus rhythm.      Past Medical History   Diagnosis  Date   .  No pertinent past medical history    .  Pregnant     Past Surgical History   Procedure  Date   .  No past surgeries     Family History   Problem  Relation  Age of Onset   .  Anesthesia problems  Neg Hx    .  Other  Neg Hx     History   Substance Use Topics   .  Smoking status:  Former Smoker -- 0.2 packs/day     Types:  Cigarettes     Quit date:  05/12/2011   .  Smokeless tobacco:  Never Used   .  Alcohol Use:  No    Allergies: No Known Allergies   Review of Systems  Constitutional: Negative for fever and chills.  Eyes: Negative for blurred vision and double vision.  Respiratory: Negative for cough and hemoptysis.  Gastrointestinal: Negative for nausea and vomiting.  Genitourinary: Negative for dysuria, urgency, frequency and hematuria.  Neurological: Negative. Negative for headaches.  Psychiatric/Behavioral: Negative.   Physical Exam   Blood pressure 105/66, pulse 96, temperature 98.6 F (37 C), temperature source Oral, resp. rate 20, last menstrual period 04/03/2011, SpO2 100.00%.  Physical Exam  Constitutional: She is oriented to person, place, and time. She appears well-developed and well-nourished. No distress.  HENT:  Head: Normocephalic and atraumatic.  Mouth/Throat: No oropharyngeal exudate.  Diffuse early ecchymosis over frontal area, no battle sign,  Eyes: Right eye exhibits no discharge. Left eye exhibits no discharge. No scleral  icterus.  Neck: No JVD present. No tracheal deviation present. No thyromegaly present.  Cardiovascular: Normal rate and regular rhythm.  Respiratory: Effort normal and breath sounds normal. No stridor.  GI: Soft. She exhibits distension and mass. There is tenderness. There is no rebound and no guarding.  Gravid, minimal diffuse tenderness  Musculoskeletal: Normal range of motion. She exhibits no edema and no tenderness.  Neurological: She is alert and oriented to person, place, and time.  Skin: Skin is warm and dry. No rash noted. She is not diaphoretic. No erythema. No pallor.  Psychiatric: She has a normal mood and affect. Her behavior is normal. Judgment and thought content normal.   MAU Course   Procedures  MDM  UA without blood (incidental finding of Trichomonas)  Pt to undergo 4 hours of fetal monitoring.  Assessment and Plan   S/p Fall - No evidence of fetal distress  Trichimonas  Andrena Mews, DO  [facility] Resident - PGY-1  11/26/2011 7:13 AM   Addedum:  Sid Falcon  Exam - GU +white frothy discharge, negative pooling, negative bleeding Cervix - closed  FHR 130's, +accels, no decel Toco - irregular contractions  Consult with Dr.  Macon Large - admit for observation

## 2011-11-26 NOTE — MAU Provider Note (Signed)
  History     CSN: [id number]  Arrival date and time: 11/26/11 0457   None     Chief Complaint  Patient presents with  . Abdominal Pain  . Chest Pain   HPI Comments: Pt is 21 yo G4P3003 @ 33.6 who is s/p fall and evaluated at [facility] ED.  She was mopping the floor and slipped landing on her forehead, sternum and abdomen.  No bleeding, no loss of fluids.  Feeling good fetal movement.   OB History    Grav Para Term Preterm Abortions TAB SAB Ect Mult Living   4 3 3  0 0 0 0 0 0 3      Past Medical History  Diagnosis Date  . No pertinent past medical history   . Pregnant     Past Surgical History  Procedure Date  . No past surgeries     Family History  Problem Relation Age of Onset  . Anesthesia problems Neg Hx   . Other Neg Hx     History  Substance Use Topics  . Smoking status: Former Smoker -- 0.2 packs/day    Types: Cigarettes    Quit date: 05/12/2011  . Smokeless tobacco: Never Used  . Alcohol Use: No    Allergies: No Known Allergies   (Not in a hospital admission)  Review of Systems  Constitutional: Negative for fever and chills.  Eyes: Negative for blurred vision and double vision.  Respiratory: Negative for cough and hemoptysis.   Gastrointestinal: Negative for nausea and vomiting.  Genitourinary: Negative for dysuria, urgency, frequency and hematuria.  Neurological: Negative.  Negative for headaches.  Psychiatric/Behavioral: Negative.    Physical Exam   Blood pressure 105/66, pulse 96, temperature 98.6 F (37 C), temperature source Oral, resp. rate 20, last menstrual period 04/03/2011, SpO2 100.00%.  Physical Exam  Constitutional: She is oriented to person, place, and time. She appears well-developed and well-nourished. No distress.  HENT:  Head: Normocephalic and atraumatic.  Mouth/Throat: No oropharyngeal exudate.       Diffuse early ecchymosis over frontal area, no battle sign,   Eyes: Right eye exhibits no discharge. Left eye exhibits no  discharge. No scleral icterus.  Neck: No JVD present. No tracheal deviation present. No thyromegaly present.  Cardiovascular: Normal rate and regular rhythm.   Respiratory: Effort normal and breath sounds normal. No stridor.  GI: Soft. She exhibits distension and mass. There is tenderness. There is no rebound and no guarding.       Gravid, minimal diffuse tenderness  Musculoskeletal: Normal range of motion. She exhibits no edema and no tenderness.  Neurological: She is alert and oriented to person, place, and time.  Skin: Skin is warm and dry. No rash noted. She is not diaphoretic. No erythema. No pallor.  Psychiatric: She has a normal mood and affect. Her behavior is normal. Judgment and thought content normal.    MAU Course  Procedures  MDM UA without blood (incidental finding of Trichomonas) Pt to undergo 4 hours of fetal monitoring.  Assessment and Plan  S/p Fall - Care handed off to [place] Trichimonas - s/p Flagyl 2000mg  X 1  Andrena Mews, DO [facility] Resident - PGY-1 11/26/2011 7:13 AM

## 2011-11-26 NOTE — ED Notes (Signed)
Call from [facility] for evaluation of pt with limited/no PNC who complains of contractions and a fall. Spoke to RN and provided Dr Marice Potter phone for transfer to [facility] for evaluation. Will go and monitor FHR/UC. she has had only one visit on 6/5 and will need follow up so transfter is suggested to EDP

## 2011-11-26 NOTE — ED Provider Notes (Addendum)
History     CSN: [id number]  Arrival date & time 11/26/11  0457   First MD Initiated Contact with Patient 11/26/11 [phone]      Chief Complaint  Patient presents with  . Abdominal Pain  . Chest Pain    (Consider location/radiation/quality/duration/timing/severity/associated sxs/prior treatment) HPI Comments: Patient is pregnant 21 year old female presenting with pain in the abdomen and chest after a fall onto the chest and abdomen.  No bleeding or leak of fluid.  Denies other injury.  She has felt the baby move.  She denies shortness of breath.    This is the patient's 4th child and she has had no prenatal care with the exception of [facility].    The history is provided by the patient.    Past Medical History  Diagnosis Date  . No pertinent past medical history   . Pregnant     Past Surgical History  Procedure Date  . No past surgeries     Family History  Problem Relation Age of Onset  . Anesthesia problems Neg Hx     History  Substance Use Topics  . Smoking status: Former Smoker -- 0.2 packs/day    Types: Cigarettes    Quit date: 05/12/2011  . Smokeless tobacco: Never Used  . Alcohol Use: No    OB History    Grav Para Term Preterm Abortions TAB SAB Ect Mult Living   4 3 3  0 0 0 0 0 0 3      Review of Systems  All other systems reviewed and are negative.    Allergies  Review of patient's allergies indicates no known allergies.  Home Medications  No current outpatient prescriptions on file.  BP 113/64  Pulse 99  Temp 98.6 F (37 C) (Oral)  Resp 20  SpO2 100%  LMP 04/03/2011  Physical Exam  Nursing note and vitals reviewed. Constitutional: She is oriented to person, place, and time. She appears well-developed and well-nourished. No distress.  HENT:  Head: Normocephalic and atraumatic.  Neck: Normal range of motion. Neck supple.  Cardiovascular: Normal rate, regular rhythm and normal heart sounds.   No murmur  heard. Pulmonary/Chest: Effort normal and breath sounds normal. No respiratory distress.  Abdominal: Soft. Bowel sounds are normal. There is no tenderness.       The fundal height appears consistent with stated gestational age.  Musculoskeletal: Normal range of motion.  Neurological: She is alert and oriented to person, place, and time.  Skin: Skin is warm and dry. She is not diaphoretic.    ED Course  Procedures (including critical care time)  Labs Reviewed  POCT I-STAT, CHEM 8 - Abnormal; Notable for the following:    Hemoglobin 8.5 (*)     HCT 25.0 (*)     All other components within normal limits  CBC  DIFFERENTIAL  URINALYSIS, ROUTINE W REFLEX MICROSCOPIC   No results found.   No diagnosis found.   Date: 11/26/2011  Rate: 90  Rhythm: normal sinus rhythm  QRS Axis: normal  Intervals: normal  ST/T Wave abnormalities: normal  Conduction Disutrbances:none  Narrative Interpretation:   Old EKG Reviewed: unchanged    MDM  Rapid OB response has evaluated the patient and spoke with Dr. Marice Potter who is on for OB.  She recommends transfer to [facility]  Arrangements will be made with [facility] to get the patient there.        Geoffery Lyons, MD 11/26/11 [phone]  Geoffery Lyons, MD 11/26/11 [phone]

## 2011-11-26 NOTE — MAU Note (Signed)
Pt going to [place] then to Ante room 153

## 2011-11-26 NOTE — H&P (Signed)
Attestation of Attending Supervision of Advanced Practitioner: Evaluation and management procedures were performed by the [facility] Fellow/PA/CNM/NP under my supervision and collaboration. Chart reviewed, and agree with management and plan.  Jaynie Collins, M.D. 11/26/2011 11:13 AM

## 2011-11-27 DIAGNOSIS — O9A219 Injury, poisoning and certain other consequences of external causes complicating pregnancy, unspecified trimester: Secondary | ICD-10-CM | POA: Diagnosis present

## 2011-11-27 MED ORDER — METRONIDAZOLE 500 MG PO TABS: 500.0000 mg | ORAL_TABLET | Freq: Two times a day (BID) | ORAL | Status: AC

## 2011-11-27 NOTE — Discharge Instructions (Signed)

## 2011-11-27 NOTE — Discharge Summary (Signed)
Antenatal Physician Discharge Summary  Patient ID: Monique Harris MRN: [id number] DOB/AGE: Jul 15, 1990 20 y.o.  Admit date: 11/26/2011 Discharge date: 11/27/2011  Admission Diagnoses: IUP at [redacted]w[redacted]d, abdominal trauma after fall, contractions, no bleeding  Discharge Diagnoses: IUP at [redacted]w[redacted]d, no preterm labor, no evidence of abruption, reassuring FHR tracing  Prenatal Procedures: NST and ultrasound  Significant Diagnostic Studies:  Results for orders placed during the hospital encounter of 11/26/11 (from the past 24 hour(s))  WET PREP, GENITAL     Status: Abnormal   Collection Time   11/26/11  9:40 AM      Component Value Range   Yeast Wet Prep HPF POC NONE SEEN  NONE SEEN   Trich, Wet Prep NONE SEEN  NONE SEEN   Clue Cells Wet Prep HPF POC MANY (*) NONE SEEN   WBC, Wet Prep HPF POC MODERATE (*) NONE SEEN    11/26/2011  LIMITED OBSTETRIC ULTRASOUND Clinical Data: Abdominal trauma, assess placenta    Number of Fetuses: 1 Heart Rate: 142 bpm Movement: Present Presentation: Cephalic Placental Location: Anterior Previa: Not present Amniotic Fluid (Subjective): Decreased  Vertical pocket:  2.9cm AFI: 7.4 cm (5%ile 8.1 cm, 95%ile 24.8 cm)  BPD: 8.0cm   32w   2d   EDC: 01/19/2012  MATERNAL FINDINGS: Cervix: Closed.  Measures 3.0 cm. Uterus/Adnexae: Right ovary measures 2.9 x 1.9 x 2.0 cm and is within normal limits.  Left ovary measures 2.4 x 1.0 x 1.3 cm and is within normal limits.  Additional comments:  No evidence of placental abruption.  IMPRESSION: No evidence of placental abruption.  Fetal heart rate 142 bpm.  Original Report Authenticated By: Charline Bills, M.D.   Treatments: Inpatient observation  Hospital Course:  This is a [name] [phone] with IUP at [redacted]w[redacted]d admitted for 24 hour monitoring after fall at home and contractions. She was observed, fetal heart rate monitoring remained reassuring, and she had no signs/symptoms of preterm labor, abruption or other maternal-fetal  concerns. She was deemed stable for discharge to home with outpatient follow up.  Sent home with metronidazole to treat her BV.  Discharged Condition: stable  Discharge Exam: Blood pressure 95/52, pulse 80, temperature 98.5 F (36.9 C), temperature source Oral, resp. rate 18, height 5\' 3"  (1.6 m), weight 56.246 kg (124 lb), last menstrual period 04/03/2011, SpO2 100.00%. FHR 130s, moderate variability, +accels, no decels  Toco: no contractions General appearance: alert and no distress Resp: clear to auscultation bilaterally Cardio: regular rate and rhythm GI: soft, non-tender; bowel sounds normal; no masses,  no organomegaly and gravid Pelvic: deferred Extremities: extremities normal, atraumatic, no cyanosis or edema and Homans sign is negative, no sign of DVT  Disposition: 01-Home or Self Care  Discharge Orders    Future Appointments: Provider: Department: Dept Phone: Center:   11/30/2011 11:00 AM Dorathy Kinsman, CNM [facility] [phone] [facility]      Signed: Tereso Newcomer 11/27/2011, 8:00 AM

## 2011-11-28 LAB — URINE DRUGS OF ABUSE SCREEN W ALC, ROUTINE (REF LAB)
Amphetamine Screen, Ur: NEGATIVE
Barbiturate Quant, Ur: NEGATIVE
Marijuana Metabolite: NEGATIVE
Methadone: NEGATIVE
Propoxyphene: NEGATIVE

## 2011-11-28 LAB — GC/CHLAMYDIA PROBE AMP, GENITAL
Chlamydia, DNA Probe: NEGATIVE
GC Probe Amp, Genital: NEGATIVE

## 2011-11-30 ENCOUNTER — Encounter: Payer: Medicaid Other | Admitting: Advanced Practice Midwife

## 2011-12-01 ENCOUNTER — Encounter: Payer: Self-pay | Admitting: Family

## 2011-12-05 NOTE — Progress Notes (Signed)
Post discharge ur review completed. 

## 2011-12-21 ENCOUNTER — Encounter: Payer: Self-pay | Admitting: Advanced Practice Midwife

## 2011-12-21 ENCOUNTER — Ambulatory Visit (INDEPENDENT_AMBULATORY_CARE_PROVIDER_SITE_OTHER): Payer: Medicaid Other | Admitting: Advanced Practice Midwife

## 2011-12-21 VITALS — BP 121/71 | Temp 97.2°F | Wt 132.4 lb

## 2011-12-21 DIAGNOSIS — O0930 Supervision of pregnancy with insufficient antenatal care, unspecified trimester: Secondary | ICD-10-CM

## 2011-12-21 DIAGNOSIS — O99891 Other specified diseases and conditions complicating pregnancy: Secondary | ICD-10-CM

## 2011-12-21 DIAGNOSIS — O9A219 Injury, poisoning and certain other consequences of external causes complicating pregnancy, unspecified trimester: Secondary | ICD-10-CM

## 2011-12-21 LAB — POCT URINALYSIS DIP (DEVICE)
Bilirubin Urine: NEGATIVE
Glucose, UA: NEGATIVE mg/dL
Hgb urine dipstick: NEGATIVE
Nitrite: NEGATIVE
Specific Gravity, Urine: 1.02 (ref 1.005–1.030)

## 2011-12-21 NOTE — Progress Notes (Signed)
Reviewed + GBS. Doing well. Some BH contractions.

## 2011-12-21 NOTE — Patient Instructions (Addendum)
Normal Labor and Delivery Your caregiver must first be sure you are in labor. Signs of labor include:  You may pass what is called "the mucus plug" before labor begins. This is a small amount of blood stained mucus.   Regular uterine contractions.   The time between contractions get closer together.   The discomfort and pain gradually gets more intense.   Pains are mostly located in the back.   Pains get worse when walking.   The cervix (the opening of the uterus becomes thinner (begins to efface) and opens up (dilates).  Once you are in labor and admitted into the hospital or care center, your caregiver will do the following:  A complete physical examination.   Check your vital signs (blood pressure, pulse, temperature and the fetal heart rate).   Do a vaginal examination (using a sterile glove and lubricant) to determine:   The position (presentation) of the baby (head [vertex] or buttock first).   The level (station) of the baby's head in the birth canal.   The effacement and dilatation of the cervix.   You may have your pubic hair shaved and be given an enema depending on your caregiver and the circumstance.   An electronic monitor is usually placed on your abdomen. The monitor follows the length and intensity of the contractions, as well as the baby's heart rate.   Usually, your caregiver will insert an IV in your arm with a bottle of sugar water. This is done as a precaution so that medications can be given to you quickly during labor or delivery.  NORMAL LABOR AND DELIVERY IS DIVIDED UP INTO 3 STAGES: First Stage This is when regular contractions begin and the cervix begins to efface and dilate. This stage can last from 3 to 15 hours. The end of the first stage is when the cervix is 100% effaced and 10 centimeters dilated. Pain medications may be given by   Injection (morphine, demerol, etc.)   Regional anesthesia (spinal, caudal or epidural, anesthetics given in  different locations of the spine). Paracervical pain medication may be given, which is an injection of and anesthetic on each side of the cervix.  A pregnant woman may request to have "Natural Childbirth" which is not to have any medications or anesthesia during her labor and delivery. Second Stage This is when the baby comes down through the birth canal (vagina) and is born. This can take 1 to 4 hours. As the baby's head comes down through the birth canal, you may feel like you are going to have a bowel movement. You will get the urge to bear down and push until the baby is delivered. As the baby's head is being delivered, the caregiver will decide if an episiotomy (a cut in the perineum and vagina area) is needed to prevent tearing of the tissue in this area. The episiotomy is sewn up after the delivery of the baby and placenta. Sometimes a mask with nitrous oxide is given for the mother to breath during the delivery of the baby to help if there is too much pain. The end of Stage 2 is when the baby is fully delivered. Then when the umbilical cord stops pulsating it is clamped and cut. Third Stage The third stage begins after the baby is completely delivered and ends after the placenta (afterbirth) is delivered. This usually takes 5 to 30 minutes. After the placenta is delivered, a medication is given either by intravenous or injection to help contract   the uterus and prevent bleeding. The third stage is not painful and pain medication is usually not necessary. If an episiotomy was done, it is repaired at this time. After the delivery, the mother is watched and monitored closely for 1 to 2 hours to make sure there is no postpartum bleeding (hemorrhage). If there is a lot of bleeding, medication is given to contract the uterus and stop the bleeding. Document Released: 03/08/2008 Document Revised: 05/19/2011 Document Reviewed: 03/08/2008 [employer] Patient Information 2012 [employer]. 

## 2011-12-21 NOTE — Progress Notes (Signed)
Pulse- 96  Edema- feet/legs  Pressure- "rectal" Vaginal discharge has some itching

## 2011-12-26 ENCOUNTER — Encounter: Payer: Self-pay | Admitting: Obstetrics & Gynecology

## 2011-12-26 ENCOUNTER — Ambulatory Visit (INDEPENDENT_AMBULATORY_CARE_PROVIDER_SITE_OTHER): Payer: Medicaid Other | Admitting: Obstetrics & Gynecology

## 2011-12-26 VITALS — BP 96/61 | Temp 97.5°F | Wt 130.4 lb

## 2011-12-26 DIAGNOSIS — O365990 Maternal care for other known or suspected poor fetal growth, unspecified trimester, not applicable or unspecified: Secondary | ICD-10-CM

## 2011-12-26 DIAGNOSIS — O0930 Supervision of pregnancy with insufficient antenatal care, unspecified trimester: Secondary | ICD-10-CM

## 2011-12-26 LAB — POCT URINALYSIS DIP (DEVICE)
Bilirubin Urine: NEGATIVE
Ketones, ur: NEGATIVE mg/dL
Protein, ur: NEGATIVE mg/dL
Specific Gravity, Urine: 1.015 (ref 1.005–1.030)

## 2011-12-26 NOTE — Progress Notes (Signed)
Pulse- 80 

## 2011-12-26 NOTE — Progress Notes (Signed)
U/S scheduled on December 30, 2011 at 1045 am.

## 2011-12-26 NOTE — Progress Notes (Signed)
Size less than dates.  Last vertical pocket was just over 2 cm.  growth was 31%.  Will get growth and AFI today.  GBS positive.  Pt aware.

## 2011-12-30 ENCOUNTER — Ambulatory Visit (HOSPITAL_COMMUNITY)
Admission: RE | Admit: 2011-12-30 | Discharge: 2011-12-30 | Disposition: A | Discharge status: Home / Self Care | Payer: Medicaid Other | Source: Ambulatory Visit | Attending: Obstetrics & Gynecology | Admitting: Obstetrics & Gynecology

## 2011-12-30 DIAGNOSIS — O365990 Maternal care for other known or suspected poor fetal growth, unspecified trimester, not applicable or unspecified: Secondary | ICD-10-CM

## 2011-12-30 DIAGNOSIS — O99892 Other specified diseases and conditions complicating childbirth: Secondary | ICD-10-CM | POA: Diagnosis present

## 2011-12-30 DIAGNOSIS — Z3689 Encounter for other specified antenatal screening: Secondary | ICD-10-CM | POA: Insufficient documentation

## 2011-12-30 DIAGNOSIS — O4590 Premature separation of placenta, unspecified, unspecified trimester: Principal | ICD-10-CM | POA: Diagnosis present

## 2011-12-30 DIAGNOSIS — O4290 Premature rupture of membranes, unspecified as to length of time between rupture and onset of labor, unspecified weeks of gestation: Secondary | ICD-10-CM | POA: Diagnosis present

## 2011-12-30 DIAGNOSIS — Z22330 Carrier of Group B streptococcus: Secondary | ICD-10-CM

## 2011-12-31 ENCOUNTER — Inpatient Hospital Stay (HOSPITAL_COMMUNITY)
Admission: AD | Admit: 2011-12-31 | Discharge: 2012-01-04 | DRG: 766 | Disposition: A | Discharge status: Home / Self Care | Payer: Medicaid Other | Source: Ambulatory Visit | Attending: Obstetrics and Gynecology | Admitting: Obstetrics and Gynecology

## 2011-12-31 ENCOUNTER — Encounter (HOSPITAL_COMMUNITY): Payer: Self-pay | Admitting: *Deleted

## 2011-12-31 DIAGNOSIS — O4290 Premature rupture of membranes, unspecified as to length of time between rupture and onset of labor, unspecified weeks of gestation: Secondary | ICD-10-CM

## 2011-12-31 DIAGNOSIS — O4590 Premature separation of placenta, unspecified, unspecified trimester: Secondary | ICD-10-CM

## 2011-12-31 DIAGNOSIS — O9A219 Injury, poisoning and certain other consequences of external causes complicating pregnancy, unspecified trimester: Secondary | ICD-10-CM

## 2011-12-31 DIAGNOSIS — O9989 Other specified diseases and conditions complicating pregnancy, childbirth and the puerperium: Secondary | ICD-10-CM

## 2011-12-31 NOTE — H&P (Signed)
Monique Harris is a 21 y.o. female [phone] at 75 weeks + 6 days gestation presenting for vaginal bleeding - found to have spontaneous rupture of membranes.  Maternal Medical History:  Reason for admission: Reason for admission: vaginal bleeding.  Reason for Admission:   nauseaContractions: Onset was 1-2 hours ago.   Frequency: rare.    Fetal activity: Perceived fetal activity is normal.   Last perceived fetal movement was within the past hour.    Prenatal complications: Bleeding.   No HIV, hypertension, IUGR (Pt measuring small but nml AFI and nml interval fetal growth), pre-eclampsia, preterm labor or substance abuse.   Prenatal Complications - Diabetes: none.    OB History    Grav Para Term Preterm Abortions TAB SAB Ect Mult Living   4 3 3  0 0 0 0 0 0 3     Past Medical History  Diagnosis Date  . No pertinent past medical history   . Pregnant   . Abnormal Pap smear    Past Surgical History  Procedure Date  . No past surgeries    Family History: family history is negative for Anesthesia problems and Other. Social History:  reports that she quit smoking about 7 months ago. Her smoking use included Cigarettes. She smoked .25 packs per day. She has never used smokeless tobacco. She reports that she does not drink alcohol or use illicit drugs.   Prenatal Transfer Tool   Maternal Ultrasounds/Referrals: Normal - Limited U/S done yesterday that showed normal AFI and good interval growth  Review of Systems  Constitutional: Negative for fever and chills.  Eyes: Negative for blurred vision.  Respiratory: Negative for cough and shortness of breath.   Cardiovascular: Negative for chest pain.  Gastrointestinal: Negative for heartburn, nausea, vomiting, abdominal pain, diarrhea, constipation and blood in stool.  Genitourinary: Negative for dysuria.  Skin: Negative for itching and rash.  Neurological: Negative for dizziness and headaches.      Blood pressure 108/59, pulse 95,  temperature 97.9 F (36.6 C), temperature source Oral, resp. rate 20, height 5\' 3"  (1.6 m), weight 59.875 kg (132 lb), last menstrual period 04/03/2011. Maternal Exam:  Uterine Assessment: Contraction duration is 60 seconds. Contraction frequency is irregular.   Abdomen: Patient reports no abdominal tenderness. Fetal presentation: vertex Vertex presentation confirmed by limited bedside U/S  Introitus: Normal vulva. Normal vagina.  Ferning test: not done.  Nitrazine test: not done. Amniotic fluid character: bloody. Moderate amount of bloody fluid in vault. Once cleared from os with extra large cotton swabs, it was apparent that her os was pouring mostly clear but bloody amniotic fluid that was exaggerated with coughing.  Cervix: Cervix evaluated by sterile speculum exam and digital exam.     Fetal Exam Fetal Monitor Review: Mode: ultrasound.   Baseline rate: 135.  Variability: moderate (6-25 bpm).   Pattern: accelerations present.    Fetal State Assessment: Category I - tracings are normal.     Physical Exam  Constitutional: She is oriented to person, place, and time. She appears well-developed and well-nourished. No distress.  HENT:  Head: Normocephalic and atraumatic.  Eyes: Conjunctivae are normal.  Neck: Normal range of motion. Neck supple.  Cardiovascular: Normal rate, regular rhythm and normal heart sounds.   Respiratory: Effort normal and breath sounds normal.  GI: Soft. Bowel sounds are normal.  Genitourinary:       Cervix is posterior, 2-3cm dilated, 50% effaced, and baby is high and ballotable  Neurological: She is alert and oriented to  person, place, and time.  Skin: Skin is warm and dry. She is not diaphoretic.  Psychiatric: She has a normal mood and affect. Her behavior is normal.    Prenatal labs: ABO, Rh: --/--/O POS (05/17 1313) Antibody: NEG (05/17 1313) Rubella: 314.0 (05/17 1314) RPR: NON REACTIVE (05/17 1314)  HBsAg: NEGATIVE (05/17 1314)  HIV:      GBS: Positive (06/15 0000)   Assessment/Plan: G4W1027 at 38.6 with spontaneous ROM (confirmed by gross examination): Admit for expectant management of labor. Will start PCN for GBS treatment. May augment with Pitocin if no cervical change after 1st dose of penicillin administered.  Source of bloody appearance of amniotic fluid is likely benign as fetal heart tracing is Category I and patient is not tender or with abdominal pain.  No sonographical evidence of low-lying placenta/placenta previa.  Junious Silk S 12/31/2011, 11:32 PM

## 2012-01-01 LAB — CBC
Hemoglobin: 8.6 g/dL — ABNORMAL LOW (ref 12.0–15.0)
MCHC: 32.6 g/dL (ref 30.0–36.0)
RBC: 3.16 MIL/uL — ABNORMAL LOW (ref 3.87–5.11)
WBC: 10 10*3/uL (ref 4.0–10.5)

## 2012-01-01 LAB — RPR: RPR Ser Ql: NONREACTIVE

## 2012-01-01 LAB — RAPID URINE DRUG SCREEN, HOSP PERFORMED
Amphetamines: NOT DETECTED
Benzodiazepines: NOT DETECTED
Opiates: NOT DETECTED

## 2012-01-01 MED ORDER — LACTATED RINGERS IV SOLN
INTRAVENOUS | Status: DC
  Administered 2011-12-31 – 2012-01-01 (×2): via INTRAVENOUS

## 2012-01-01 MED ORDER — IBUPROFEN 600 MG PO TABS: 600.0000 mg | ORAL_TABLET | Freq: Four times a day (QID) | ORAL | Status: DC | PRN

## 2012-01-01 MED ORDER — EPHEDRINE 5 MG/ML INJ
10.0000 mg | INTRAVENOUS | Status: DC | PRN
  Filled 2012-01-01: qty 4

## 2012-01-01 MED ORDER — CITRIC ACID-SODIUM CITRATE 334-500 MG/5ML PO SOLN
30.0000 mL | ORAL | Status: DC | PRN
  Filled 2012-01-01: qty 15

## 2012-01-01 MED ORDER — OXYTOCIN 40 UNITS IN LACTATED RINGERS INFUSION - SIMPLE MED: 62.5000 mL/h | Freq: Once | INTRAVENOUS | Status: DC

## 2012-01-01 MED ORDER — TERBUTALINE SULFATE 1 MG/ML IJ SOLN: 0.2500 mg | Freq: Once | INTRAMUSCULAR | Status: AC | PRN

## 2012-01-01 MED ORDER — EPHEDRINE 5 MG/ML INJ: 10.0000 mg | INTRAVENOUS | Status: DC | PRN

## 2012-01-01 MED ORDER — PENICILLIN G POTASSIUM 5000000 UNITS IJ SOLR
2.5000 10*6.[IU] | INTRAVENOUS | Status: DC
  Administered 2012-01-01 (×3): 2.5 10*6.[IU] via INTRAVENOUS
  Filled 2012-01-01 (×7): qty 2.5

## 2012-01-01 MED ORDER — OXYCODONE-ACETAMINOPHEN 5-325 MG PO TABS: 1.0000 | ORAL_TABLET | ORAL | Status: DC | PRN

## 2012-01-01 MED ORDER — LACTATED RINGERS IV SOLN: 500.0000 mL | INTRAVENOUS | Status: DC | PRN

## 2012-01-01 MED ORDER — FLEET ENEMA 7-19 GM/118ML RE ENEM: 1.0000 | ENEMA | RECTAL | Status: DC | PRN

## 2012-01-01 MED ORDER — OXYTOCIN BOLUS FROM INFUSION
250.0000 mL | Freq: Once | INTRAVENOUS | Status: DC
  Filled 2012-01-01: qty 500

## 2012-01-01 MED ORDER — LACTATED RINGERS IV SOLN: 500.0000 mL | Freq: Once | INTRAVENOUS | Status: DC

## 2012-01-01 MED ORDER — PHENYLEPHRINE 40 MCG/ML (10ML) SYRINGE FOR IV PUSH (FOR BLOOD PRESSURE SUPPORT)
80.0000 ug | PREFILLED_SYRINGE | INTRAVENOUS | Status: DC | PRN
  Filled 2012-01-01: qty 5

## 2012-01-01 MED ORDER — ACETAMINOPHEN 325 MG PO TABS: 650.0000 mg | ORAL_TABLET | ORAL | Status: DC | PRN

## 2012-01-01 MED ORDER — FENTANYL 2.5 MCG/ML BUPIVACAINE 1/10 % EPIDURAL INFUSION (WH - ANES)
14.0000 mL/h | INTRAMUSCULAR | Status: DC
  Filled 2012-01-01: qty 60

## 2012-01-01 MED ORDER — PENICILLIN G POTASSIUM 5000000 UNITS IJ SOLR: 2.5000 10*6.[IU] | INTRAVENOUS | Status: DC

## 2012-01-01 MED ORDER — LIDOCAINE HCL (PF) 1 % IJ SOLN: 30.0000 mL | INTRAMUSCULAR | Status: DC | PRN

## 2012-01-01 MED ORDER — OXYTOCIN 40 UNITS IN LACTATED RINGERS INFUSION - SIMPLE MED
1.0000 m[IU]/min | INTRAVENOUS | Status: DC
  Administered 2012-01-01: 2 m[IU]/min via INTRAVENOUS
  Filled 2012-01-01: qty 1000

## 2012-01-01 MED ORDER — DIPHENHYDRAMINE HCL 50 MG/ML IJ SOLN
12.5000 mg | INTRAMUSCULAR | Status: DC | PRN
  Filled 2012-01-01: qty 1

## 2012-01-01 MED ORDER — PENICILLIN G POTASSIUM 5000000 UNITS IJ SOLR
5.0000 10*6.[IU] | Freq: Once | INTRAVENOUS | Status: AC
  Administered 2012-01-01: 5 10*6.[IU] via INTRAVENOUS
  Filled 2012-01-01: qty 5

## 2012-01-01 MED ORDER — PHENYLEPHRINE 40 MCG/ML (10ML) SYRINGE FOR IV PUSH (FOR BLOOD PRESSURE SUPPORT): 80.0000 ug | PREFILLED_SYRINGE | INTRAVENOUS | Status: DC | PRN

## 2012-01-01 MED ORDER — ONDANSETRON HCL 4 MG/2ML IJ SOLN: 4.0000 mg | Freq: Four times a day (QID) | INTRAMUSCULAR | Status: DC | PRN

## 2012-01-01 MED ORDER — DEXTROSE 5 % IV SOLN: 5.0000 10*6.[IU] | Freq: Once | INTRAVENOUS | Status: DC

## 2012-01-01 NOTE — Progress Notes (Signed)
   Subjective: Pt reports that foley bulb felt as if it was coming out in BR. Objective: BP 118/61  Pulse 72  Temp 98.1 F (36.7 C) (Oral)  Resp 18  Ht 5\' 3"  (1.6 m)  Wt 59.875 kg (132 lb)  BMI 23.38 kg/m2  LMP 04/03/2011      FHT:  FHR: 120's bpm, variability: moderate,  accelerations:  Present,  decelerations:  Absent UC:   irregular, every 2-6 minutes SVE:   Dilation: 4 Effacement (%): 50 Station: -2 Exam by:: w muhammad cnm  Labs: Lab Results  Component Value Date   WBC 10.0 12/31/2011   HGB 8.6* 12/31/2011   HCT 26.4* 12/31/2011   MCV 83.5 12/31/2011   PLT 269 12/31/2011    Assessment / Plan: Augmentation of labor, progressing well  Labor: Progressing normally Preeclampsia:  n/a Fetal Wellbeing:  Category I Pain Control:  Labor support without medications I/D:  n/a Anticipated MOD:  NSVD  [facility] 01/01/2012, 3:36 PM

## 2012-01-01 NOTE — Progress Notes (Signed)
Speculum exam per Dr. Maisie Fus. Large amount of bloody fluid upon exam.

## 2012-01-01 NOTE — H&P (Signed)
Attestation of Attending Supervision of Resident: Evaluation and management procedures were performed by the [facility] Medicine Resident under my supervision.  I have seen and examined the patient, reviewed the resident's note and chart, and I agree with the management and plan.  Anibal Henderson, M.D. 01/01/2012 2:48 AM

## 2012-01-01 NOTE — Progress Notes (Signed)
Monique Harris is a 21 y.o. [phone] at [redacted]w[redacted]d by ultrasound admitted for rupture of membranes.  Subjective: Monique Harris feels no contractions.  Her bleeding has subsided.  She is anxious to get her labor started.  Objective: BP 121/56  Pulse 80  Temp 98 F (36.7 C) (Oral)  Resp 20  Ht 5\' 3"  (1.6 m)  Wt 59.875 kg (132 lb)  BMI 23.38 kg/m2  LMP 04/03/2011     FHT:  FHR: 125 bpm, variability: moderate,  accelerations:  Present,  decelerations:  Absent UC:   irregular, every 4-10 minutes (not felt by patient) SVE:   Dilation: 3 Effacement (%): 50 Station: Ballotable Exam by:: [employer] RN  Labs: Lab Results  Component Value Date   WBC 10.0 12/31/2011   HGB 8.6* 12/31/2011   HCT 26.4* 12/31/2011   MCV 83.5 12/31/2011   PLT 269 12/31/2011    Assessment / Plan: Induction of labor due to PROM - After discussion with patient, we have elected to induce with Pitocin.  Labor: No contractions Fetal Wellbeing:  Category I Pain Control:  Labor support without medications. Pt interested in epidural. Anticipated MOD:  NSVD  Mora Bellman 01/01/2012, 2:53 AM

## 2012-01-01 NOTE — Progress Notes (Signed)
Monique Harris is a 21 y.o. [phone] at [redacted]w[redacted]d by LMP admitted for rupture of membranes  Subjective: Monique Harris is still not feeling many contractions.  Her vaginal bleeding/leaking is still present but much improved.  Objective: BP 121/56  Pulse 80  Temp 98 F (36.7 C) (Oral)  Resp 20  Ht 5\' 3"  (1.6 m)  Wt 59.875 kg (132 lb)  BMI 23.38 kg/m2  LMP 04/03/2011     FHT:  FHR: 130 bpm, variability: moderate,  accelerations:  Present,  decelerations:  Absent UC:   irregular, every 6-9 minutes SVE:   Dilation: 3 Effacement (%): 50 Station: Ballotable Exam by:: [employer] RN   Assessment / Plan: Induction of labor due to PROM, Pitocin being increased as tolerated to achieve adequate labor pattern  Labor: Still not begun Fetal Wellbeing:  Category I Pain Control:  Labor support without medications Anticipated MOD:  NSVD  Monique Harris 01/01/2012, 3:40 AM

## 2012-01-01 NOTE — Progress Notes (Addendum)
   Subjective: Pt reports feeling some cramping.  Objective: BP 108/58  Pulse 89  Temp 97.4 F (36.3 C) (Oral)  Resp 16  Ht 5\' 3"  (1.6 m)  Wt 59.875 kg (132 lb)  BMI 23.38 kg/m2  LMP 04/03/2011      FHT:  FHR: 120's bpm, variability: moderate,  accelerations:  Present,  decelerations:  Absent UC:   irregular, every 2-10 minutes SVE:   Dilation: 2.5 Effacement (%): 50 Station: -2 Exam by:: katie forsell rn  Labs: Lab Results  Component Value Date   WBC 10.0 12/31/2011   HGB 8.6* 12/31/2011   HCT 26.4* 12/31/2011   MCV 83.5 12/31/2011   PLT 269 12/31/2011    Assessment / Plan: Premature Rupture of Membranes GBS pos  Labor: Premature Rupture of Membranes;  No cervical change after pitocin Preeclampsia:  n/a Fetal Wellbeing:  Category I Pain Control:  Labor support without medications I/D:  n/a Anticipated MOD:  NSVD  Foley bulb placed without difficulty  [facility] 01/01/2012, 11:07 AM

## 2012-01-01 NOTE — Progress Notes (Signed)
I agree with the above. Monique Harris 7:22 AM 01/01/2012

## 2012-01-02 ENCOUNTER — Encounter (HOSPITAL_COMMUNITY): Payer: Self-pay | Admitting: Anesthesiology

## 2012-01-02 ENCOUNTER — Encounter: Payer: Self-pay | Admitting: Obstetrics and Gynecology

## 2012-01-02 ENCOUNTER — Other Ambulatory Visit: Payer: Self-pay | Admitting: Obstetrics and Gynecology

## 2012-01-02 ENCOUNTER — Encounter: Payer: Medicaid Other | Admitting: Advanced Practice Midwife

## 2012-01-02 ENCOUNTER — Encounter (HOSPITAL_COMMUNITY)
Admission: AD | Disposition: A | Discharge status: Home / Self Care | Payer: Self-pay | Source: Ambulatory Visit | Attending: Obstetrics and Gynecology

## 2012-01-02 DIAGNOSIS — O4290 Premature rupture of membranes, unspecified as to length of time between rupture and onset of labor, unspecified weeks of gestation: Secondary | ICD-10-CM

## 2012-01-02 DIAGNOSIS — O9989 Other specified diseases and conditions complicating pregnancy, childbirth and the puerperium: Secondary | ICD-10-CM

## 2012-01-02 DIAGNOSIS — O4590 Premature separation of placenta, unspecified, unspecified trimester: Secondary | ICD-10-CM

## 2012-01-02 SURGERY — Surgical Case
Anesthesia: Epidural | Site: Abdomen | Wound class: Clean Contaminated

## 2012-01-02 MED ORDER — SODIUM CHLORIDE 0.9 % IV SOLN
1.0000 ug/kg/h | INTRAVENOUS | Status: DC | PRN
  Filled 2012-01-02: qty 2.5

## 2012-01-02 MED ORDER — KETOROLAC TROMETHAMINE 30 MG/ML IJ SOLN: 30.0000 mg | Freq: Four times a day (QID) | INTRAMUSCULAR | Status: AC | PRN

## 2012-01-02 MED ORDER — METOCLOPRAMIDE HCL 5 MG/ML IJ SOLN: 10.0000 mg | Freq: Once | INTRAMUSCULAR | Status: AC | PRN

## 2012-01-02 MED ORDER — KETOROLAC TROMETHAMINE 30 MG/ML IJ SOLN
INTRAMUSCULAR | Status: AC
  Filled 2012-01-02: qty 1

## 2012-01-02 MED ORDER — METOCLOPRAMIDE HCL 5 MG/ML IJ SOLN: 10.0000 mg | Freq: Three times a day (TID) | INTRAMUSCULAR | Status: DC | PRN

## 2012-01-02 MED ORDER — PHENYLEPHRINE 40 MCG/ML (10ML) SYRINGE FOR IV PUSH (FOR BLOOD PRESSURE SUPPORT)
PREFILLED_SYRINGE | INTRAVENOUS | Status: AC
  Filled 2012-01-02: qty 10

## 2012-01-02 MED ORDER — DIBUCAINE 1 % RE OINT: 1.0000 "application " | TOPICAL_OINTMENT | RECTAL | Status: DC | PRN

## 2012-01-02 MED ORDER — FENTANYL CITRATE 0.05 MG/ML IJ SOLN
INTRAMUSCULAR | Status: AC
  Administered 2012-01-02: 25 ug via INTRAVENOUS
  Filled 2012-01-02: qty 2

## 2012-01-02 MED ORDER — MEPERIDINE HCL 25 MG/ML IJ SOLN: 6.2500 mg | INTRAMUSCULAR | Status: DC | PRN

## 2012-01-02 MED ORDER — NALBUPHINE HCL 10 MG/ML IJ SOLN
5.0000 mg | INTRAMUSCULAR | Status: DC | PRN
  Filled 2012-01-02 (×2): qty 1

## 2012-01-02 MED ORDER — SODIUM CHLORIDE 0.9 % IJ SOLN: 3.0000 mL | INTRAMUSCULAR | Status: DC | PRN

## 2012-01-02 MED ORDER — SIMETHICONE 80 MG PO CHEW: 80.0000 mg | CHEWABLE_TABLET | ORAL | Status: DC | PRN

## 2012-01-02 MED ORDER — LIDOCAINE-EPINEPHRINE (PF) 2 %-1:200000 IJ SOLN
INTRAMUSCULAR | Status: AC
  Filled 2012-01-02: qty 20

## 2012-01-02 MED ORDER — LACTATED RINGERS IV SOLN
INTRAVENOUS | Status: DC
  Administered 2012-01-02 (×2): via INTRAVENOUS

## 2012-01-02 MED ORDER — KETOROLAC TROMETHAMINE 30 MG/ML IJ SOLN
30.0000 mg | Freq: Four times a day (QID) | INTRAMUSCULAR | Status: AC | PRN
  Administered 2012-01-02: 30 mg via INTRAVENOUS

## 2012-01-02 MED ORDER — IBUPROFEN 600 MG PO TABS
600.0000 mg | ORAL_TABLET | Freq: Four times a day (QID) | ORAL | Status: DC
  Administered 2012-01-02 – 2012-01-04 (×9): 600 mg via ORAL
  Filled 2012-01-02 (×9): qty 1

## 2012-01-02 MED ORDER — NALOXONE HCL 0.4 MG/ML IJ SOLN: 0.4000 mg | INTRAMUSCULAR | Status: DC | PRN

## 2012-01-02 MED ORDER — FENTANYL CITRATE 0.05 MG/ML IJ SOLN
25.0000 ug | INTRAMUSCULAR | Status: DC | PRN
  Administered 2012-01-02: 25 ug via INTRAVENOUS

## 2012-01-02 MED ORDER — OXYCODONE-ACETAMINOPHEN 5-325 MG PO TABS
1.0000 | ORAL_TABLET | ORAL | Status: DC | PRN
  Administered 2012-01-03 – 2012-01-04 (×5): 1 via ORAL
  Filled 2012-01-02 (×5): qty 1

## 2012-01-02 MED ORDER — MEPERIDINE HCL 25 MG/ML IJ SOLN
INTRAMUSCULAR | Status: AC
  Filled 2012-01-02: qty 1

## 2012-01-02 MED ORDER — ONDANSETRON HCL 4 MG/2ML IJ SOLN
INTRAMUSCULAR | Status: AC
  Filled 2012-01-02: qty 2

## 2012-01-02 MED ORDER — LANOLIN HYDROUS EX OINT: 1.0000 "application " | TOPICAL_OINTMENT | CUTANEOUS | Status: DC | PRN

## 2012-01-02 MED ORDER — IBUPROFEN 600 MG PO TABS: 600.0000 mg | ORAL_TABLET | Freq: Four times a day (QID) | ORAL | Status: DC | PRN

## 2012-01-02 MED ORDER — DIPHENHYDRAMINE HCL 25 MG PO CAPS: 25.0000 mg | ORAL_CAPSULE | Freq: Four times a day (QID) | ORAL | Status: DC | PRN

## 2012-01-02 MED ORDER — DIPHENHYDRAMINE HCL 25 MG PO CAPS: 25.0000 mg | ORAL_CAPSULE | ORAL | Status: DC | PRN

## 2012-01-02 MED ORDER — MORPHINE SULFATE 0.5 MG/ML IJ SOLN
INTRAMUSCULAR | Status: AC
  Filled 2012-01-02: qty 10

## 2012-01-02 MED ORDER — PRENATAL MULTIVITAMIN CH
1.0000 | ORAL_TABLET | Freq: Every day | ORAL | Status: DC
  Administered 2012-01-02 – 2012-01-04 (×3): 1 via ORAL
  Filled 2012-01-02 (×3): qty 1

## 2012-01-02 MED ORDER — 0.9 % SODIUM CHLORIDE (POUR BTL) OPTIME
TOPICAL | Status: DC | PRN
  Administered 2012-01-02: 1000 mL

## 2012-01-02 MED ORDER — SIMETHICONE 80 MG PO CHEW
80.0000 mg | CHEWABLE_TABLET | Freq: Three times a day (TID) | ORAL | Status: DC
  Administered 2012-01-02 – 2012-01-04 (×9): 80 mg via ORAL

## 2012-01-02 MED ORDER — SCOPOLAMINE 1 MG/3DAYS TD PT72
1.0000 | MEDICATED_PATCH | Freq: Once | TRANSDERMAL | Status: DC
  Administered 2012-01-02: 1.5 mg via TRANSDERMAL
  Filled 2012-01-02: qty 1

## 2012-01-02 MED ORDER — NALBUPHINE HCL 10 MG/ML IJ SOLN
5.0000 mg | INTRAMUSCULAR | Status: DC | PRN
  Administered 2012-01-02: 10 mg via SUBCUTANEOUS
  Filled 2012-01-02: qty 1

## 2012-01-02 MED ORDER — DIPHENHYDRAMINE HCL 50 MG/ML IJ SOLN: 25.0000 mg | INTRAMUSCULAR | Status: DC | PRN

## 2012-01-02 MED ORDER — DIPHENHYDRAMINE HCL 50 MG/ML IJ SOLN: 12.5000 mg | INTRAMUSCULAR | Status: DC | PRN

## 2012-01-02 MED ORDER — ONDANSETRON HCL 4 MG PO TABS: 4.0000 mg | ORAL_TABLET | ORAL | Status: DC | PRN

## 2012-01-02 MED ORDER — ZOLPIDEM TARTRATE 5 MG PO TABS: 5.0000 mg | ORAL_TABLET | Freq: Every evening | ORAL | Status: DC | PRN

## 2012-01-02 MED ORDER — ONDANSETRON HCL 4 MG/2ML IJ SOLN: 4.0000 mg | Freq: Three times a day (TID) | INTRAMUSCULAR | Status: DC | PRN

## 2012-01-02 MED ORDER — OXYTOCIN 10 UNIT/ML IJ SOLN
INTRAMUSCULAR | Status: AC
  Filled 2012-01-02: qty 4

## 2012-01-02 MED ORDER — OXYTOCIN 40 UNITS IN LACTATED RINGERS INFUSION - SIMPLE MED: 62.5000 mL/h | INTRAVENOUS | Status: AC

## 2012-01-02 MED ORDER — CEFAZOLIN SODIUM-DEXTROSE 2-3 GM-% IV SOLR
INTRAVENOUS | Status: AC
  Filled 2012-01-02: qty 50

## 2012-01-02 MED ORDER — KETOROLAC TROMETHAMINE 60 MG/2ML IM SOLN
60.0000 mg | Freq: Once | INTRAMUSCULAR | Status: AC | PRN
  Filled 2012-01-02: qty 2

## 2012-01-02 MED ORDER — NALBUPHINE HCL 10 MG/ML IJ SOLN
5.0000 mg | INTRAMUSCULAR | Status: DC | PRN
  Filled 2012-01-02: qty 1

## 2012-01-02 MED ORDER — SCOPOLAMINE 1 MG/3DAYS TD PT72
1.0000 | MEDICATED_PATCH | Freq: Once | TRANSDERMAL | Status: DC
  Filled 2012-01-02: qty 1

## 2012-01-02 MED ORDER — FENTANYL CITRATE 0.05 MG/ML IJ SOLN
INTRAMUSCULAR | Status: AC
  Filled 2012-01-02: qty 2

## 2012-01-02 MED ORDER — SENNOSIDES-DOCUSATE SODIUM 8.6-50 MG PO TABS
2.0000 | ORAL_TABLET | Freq: Every day | ORAL | Status: DC
  Administered 2012-01-02 – 2012-01-03 (×2): 2 via ORAL

## 2012-01-02 MED ORDER — TETANUS-DIPHTH-ACELL PERTUSSIS 5-2.5-18.5 LF-MCG/0.5 IM SUSP
0.5000 mL | Freq: Once | INTRAMUSCULAR | Status: AC
  Administered 2012-01-03: 0.5 mL via INTRAMUSCULAR
  Filled 2012-01-02: qty 0.5

## 2012-01-02 MED ORDER — TERBUTALINE SULFATE 1 MG/ML IJ SOLN
INTRAMUSCULAR | Status: AC
  Filled 2012-01-02: qty 1

## 2012-01-02 MED ORDER — MENTHOL 3 MG MT LOZG: 1.0000 | LOZENGE | OROMUCOSAL | Status: DC | PRN

## 2012-01-02 MED ORDER — WITCH HAZEL-GLYCERIN EX PADS: 1.0000 "application " | MEDICATED_PAD | CUTANEOUS | Status: DC | PRN

## 2012-01-02 MED ORDER — SCOPOLAMINE 1 MG/3DAYS TD PT72
MEDICATED_PATCH | TRANSDERMAL | Status: AC
  Administered 2012-01-02: 1.5 mg via TRANSDERMAL
  Filled 2012-01-02: qty 1

## 2012-01-02 MED ORDER — ONDANSETRON HCL 4 MG/2ML IJ SOLN: 4.0000 mg | INTRAMUSCULAR | Status: DC | PRN

## 2012-01-02 MED ORDER — BUPIVACAINE HCL (PF) 0.5 % IJ SOLN
INTRAMUSCULAR | Status: AC
  Filled 2012-01-02: qty 30

## 2012-01-02 SURGICAL SUPPLY — 29 items
BENZOIN TINCTURE PRP APPL 2/3 (GAUZE/BANDAGES/DRESSINGS) ×2 IMPLANT
CLOTH BEACON ORANGE TIMEOUT ST (SAFETY) ×2 IMPLANT
DRESSING TELFA 8X3 (GAUZE/BANDAGES/DRESSINGS) ×2 IMPLANT
ELECT REM PT RETURN 9FT ADLT (ELECTROSURGICAL) ×2
ELECTRODE REM PT RTRN 9FT ADLT (ELECTROSURGICAL) ×1 IMPLANT
EXTRACTOR VACUUM KIWI (MISCELLANEOUS) IMPLANT
GLOVE BIO SURGEON ST LM GN SZ9 (GLOVE) ×2 IMPLANT
GLOVE BIOGEL PI IND STRL 9 (GLOVE) ×2 IMPLANT
GLOVE BIOGEL PI INDICATOR 9 (GLOVE) ×2
GOWN PREVENTION PLUS LG XLONG (DISPOSABLE) ×2 IMPLANT
GOWN PREVENTION PLUS XLARGE (GOWN DISPOSABLE) ×2 IMPLANT
GOWN STRL REIN 3XL LVL4 (GOWN DISPOSABLE) ×2 IMPLANT
NEEDLE HYPO 25X5/8 SAFETYGLIDE (NEEDLE) IMPLANT
NS IRRIG 1000ML POUR BTL (IV SOLUTION) ×2 IMPLANT
PACK C SECTION WH (CUSTOM PROCEDURE TRAY) ×2 IMPLANT
RETRACTOR WND ALEXIS 25 LRG (MISCELLANEOUS) IMPLANT
RTRCTR C-SECT PINK 25CM LRG (MISCELLANEOUS) IMPLANT
RTRCTR WOUND ALEXIS 25CM LRG (MISCELLANEOUS)
SLEEVE SCD COMPRESS KNEE MED (MISCELLANEOUS) ×2 IMPLANT
STRIP CLOSURE SKIN 1/2X4 (GAUZE/BANDAGES/DRESSINGS) ×2 IMPLANT
SUT CHROMIC 0 CTX 36 (SUTURE) ×4 IMPLANT
SUT VIC AB 0 CT1 27 (SUTURE) ×1
SUT VIC AB 0 CT1 27XBRD ANBCTR (SUTURE) ×1 IMPLANT
SUT VIC AB 2-0 CT1 27 (SUTURE) ×2
SUT VIC AB 2-0 CT1 TAPERPNT 27 (SUTURE) ×2 IMPLANT
SUT VIC AB 4-0 KS 27 (SUTURE) ×2 IMPLANT
SYR BULB IRRIGATION 50ML (SYRINGE) IMPLANT
TRAY FOLEY CATH 14FR (SET/KITS/TRAYS/PACK) ×2 IMPLANT
WATER STERILE IRR 1000ML POUR (IV SOLUTION) ×2 IMPLANT

## 2012-01-02 NOTE — Progress Notes (Signed)
I have seen and examined this patient and I agree with the above. Cam Hai 9:53 AM 01/02/2012

## 2012-01-02 NOTE — Op Note (Signed)
NAMEMarland Kitchen  [name], [name] NO.:  [id number]  MEDICAL RECORD NO.:  [id number]  LOCATION:  9103                          FACILITY:  [facility]  PHYSICIAN:  Tilda Burrow, M.D. DATE OF BIRTH:  05/04/1991  DATE OF PROCEDURE:  01/02/2012 DATE OF DISCHARGE:                              OPERATIVE REPORT   PREOPERATIVE DIAGNOSIS:  Pregnancy at term, active labor, nonreassuring fetal heart tones and nonreassuring fetal status.  POSTOPERATIVE DIAGNOSIS:  Pregnancy at term, delivered, active labor, nonreassuring fetal status, placental abruption 5%, nuchal cord x1.  PROCEDURE:  Primary low transverse cervical cesarean section.  SURGEON:  Tilda Burrow, MD.  ASSISTANT:  None.  ANESTHESIA:  Epidural.  COMPLICATIONS:  None.  ESTIMATED BLOOD LOSS:  650 mL.  FINDINGS:  Healthy female infant, Apgars 8 and 9, pH 7.36 with placenta notable for an old organized clot along the leading edge of the previously anterior placenta plus estimated 5% separation.  FLUIDS:  1200 mL.  URINE OUTPUT:  100 mL.  DETAILS OF PROCEDURE:  The patient was taken to the operating room, prepped and draped for lower abdominal surgery.  She received terbutaline in the Labor and Delivery area.  Fetal heart tones were documented in the 120 range in 2 contraction immediately before disconnecting the catheter, at which time heart rate dipped once again to the 80 range.  Transverse lower abdominal incision was made in method of Pfannenstiel with easy entry to the peritoneal cavity, development of bladder flap anteriorly, transverse uterine incision made and extended laterally using index finger traction.  Fetal vertex was guided through the incision without difficulty and the infant expelled with fundal pressure.  There was a nuchal cord x1.  Slipped over the head prior to delivery of the body.  Placenta was then inspected after the baby had been delivered and handed to the pediatricians.  The placenta  came out intact and on the leading edge, which was from the anterior lower uterine segment area, there was an area of organized clot, dark and old attached to the anterior wall consistent with anterior small abruption which likely explains the bleeding early in hospital care.  Cord blood gas was obtained.  PH 7.36.  The placenta was clamped and found to have no membrane remnants, closed with a running locking single first layer and a continuous running second layer using 0 chromic. Bladder flap was loosely reapproximated with 2-0 Vicryl.  The abdomen was irrigated with saline solution, anterior peritoneum closed with 2-0 Vicryl, the fascia closed with 0 Vicryl, subcu tissues reapproximated with 2-0 Vicryl, and subcuticular 4-0 Vicryl used to close the skin. Sponge and needle counts were correct.  EBL 650 mL.  The patient went to recovery room in stable condition.     Tilda Burrow, M.D.     JVF/MEDQ  D:  01/02/2012  T:  01/02/2012  Job:  Harris

## 2012-01-02 NOTE — Anesthesia Postprocedure Evaluation (Signed)
  Anesthesia Post-op Note  Patient: Monique Harris  Procedure(s) Performed: Procedure(s) (LRB): CESAREAN SECTION (N/A)  Patient Location: PACU  Anesthesia Type: Epidural  Level of Consciousness: awake, alert  and oriented  Airway and Oxygen Therapy: Patient Spontanous Breathing  Post-op Pain: none  Post-op Assessment: Post-op Vital signs reviewed, Patient's Cardiovascular Status Stable, Respiratory Function Stable, Patent Airway, No signs of Nausea or vomiting, Pain level controlled, No headache and No backache  Post-op Vital Signs: Reviewed and stable  Complications: No apparent anesthesia complications

## 2012-01-02 NOTE — Anesthesia Postprocedure Evaluation (Signed)
  Anesthesia Post-op Note  Patient: Monique Harris  Procedure(s) Performed: Procedure(s) (LRB): CESAREAN SECTION (N/A)  Patient Location: PACU and Mother/Baby  Anesthesia Type: Epidural  Level of Consciousness: awake  Airway and Oxygen Therapy: Patient Spontanous Breathing  Post-op Pain: none  Post-op Assessment: Post-op Vital signs reviewed, Respiratory Function Stable, Patent Airway, No signs of Nausea or vomiting, Adequate PO intake, Pain level controlled, No headache, No backache, No residual numbness and No residual motor weakness  Post-op Vital Signs: Reviewed and stable  Complications: No apparent anesthesia complications

## 2012-01-03 ENCOUNTER — Encounter (HOSPITAL_COMMUNITY): Payer: Self-pay | Admitting: *Deleted

## 2012-01-03 NOTE — Progress Notes (Signed)
UR chart review completed.  

## 2012-01-03 NOTE — Downtime Event Note (Signed)
The EMR was down for 1630-0230 hours on 01/01/12-7/22-13  [facility] was responsible for completing the paper charting during this time period.   The following information was re-entered into the system by Dionicia Abler: Height/weight, Allergies, Problem list, Home meds, Flowsheet data, Intake and output, Orders and MAR  The following information will remain in the paper chart: Delivery record, progress notes  Dionicia Abler 01/03/2012

## 2012-01-03 NOTE — Progress Notes (Signed)
Subjective: Postpartum Day 1: Cesarean Delivery PLTC Patient reports incisional pain, tolerating PO and no problems voiding.    Objective: Vital signs in last 24 hours: Temp:  [97.6 F (36.4 C)-98.5 F (36.9 C)] 98.3 F (36.8 C) (07/23 0515) Pulse Rate:  [62-91] 62  (07/23 0515) Resp:  [16-20] 16  (07/23 0515) BP: (96-122)/(55-79) 96/60 mmHg (07/23 0515) SpO2:  [96 %-100 %] 100 % (07/23 0130)  Physical Exam:  General: alert, cooperative and no distress Lochia: appropriate Uterine Fundus: firm Incision: no significant drainage, dressing dry DVT Evaluation: Negative Homan's sign. No cords or calf tenderness. No significant calf/ankle edema.   Basename 12/31/11 2350  HGB 8.6*  HCT 26.4*    Assessment/Plan: Status post Cesarean section. Doing well postoperatively.  Anemia: as expected with operative birth Pt desires early discharge home today. Will check with the nursery to make sure baby is clear to be released.   Plans for implanon for birth control. Will followup with women's outpatient clinic  Golden Circle 01/03/2012, 6:47 AM  I have seen/examined this patient and agree with above. Inanna Telford E.

## 2012-01-04 ENCOUNTER — Encounter (HOSPITAL_COMMUNITY): Payer: Self-pay | Admitting: Obstetrics and Gynecology

## 2012-01-04 MED ORDER — IBUPROFEN 600 MG PO TABS: 600.0000 mg | ORAL_TABLET | Freq: Four times a day (QID) | ORAL | Status: AC

## 2012-01-04 MED ORDER — OXYCODONE-ACETAMINOPHEN 5-325 MG PO TABS: 1.0000 | ORAL_TABLET | ORAL | Status: AC | PRN

## 2012-01-04 NOTE — Discharge Summary (Signed)
Obstetric Discharge Summary Reason for Admission: onset of labor Prenatal Procedures: none Intrapartum Procedures: cesarean: low cervical, transverse due to fetal distress Postpartum Procedures: none Complications-Operative and Postpartum: none Hemoglobin  Date Value Range Status  12/31/2011 8.6* 12.0 - 15.0 g/dL Final     HCT  Date Value Range Status  12/31/2011 26.4* 36.0 - 46.0 % Final    Physical Exam:  General: alert, cooperative and no distress Lochia: appropriate Uterine Fundus: firm Incision: no significant drainage, no dehiscence, dressing clean and dry DVT Evaluation: Negative Homan's sign. No cords or calf tenderness. No significant calf/ankle edema.  Discharge Diagnoses: Term Pregnancy-delivered  Discharge Information: Date: 01/04/2012 Activity: pelvic rest Diet: routine Medications: Ibuprofen Condition: stable Instructions: refer to practice specific booklet Discharge to: home   Newborn Data:   Jariah, Tarkowski [[id number]]  Live born unspecified sex  Birth Weight:  APGAR: ,    Delaynee, Alred [[id number]]  Live born female  Birth Weight: 5 lb 12.1 oz (2611 g) APGAR: 8, 9  Home with mother. Bottle feeding.   Pt intends to follow-up with implanon for contraception.  Golden Circle 01/04/2012, 7:52 AM  I have seen and examined this patient and I agree with the above. Clelia Croft, Miri Jose 8:05 AM 01/04/2012

## 2012-05-28 ENCOUNTER — Ambulatory Visit: Payer: Medicaid Other | Admitting: Obstetrics and Gynecology

## 2012-06-11 ENCOUNTER — Ambulatory Visit: Payer: Medicaid Other | Admitting: Obstetrics and Gynecology

## 2012-06-15 ENCOUNTER — Inpatient Hospital Stay (HOSPITAL_COMMUNITY): Payer: Medicaid Other

## 2012-06-15 ENCOUNTER — Encounter (HOSPITAL_COMMUNITY): Payer: Self-pay | Admitting: *Deleted

## 2012-06-15 ENCOUNTER — Inpatient Hospital Stay (HOSPITAL_COMMUNITY)
Admission: AD | Admit: 2012-06-15 | Discharge: 2012-06-15 | Disposition: A | Discharge status: Home / Self Care | Payer: Medicaid Other | Source: Ambulatory Visit | Attending: Obstetrics & Gynecology | Admitting: Obstetrics & Gynecology

## 2012-06-15 DIAGNOSIS — K089 Disorder of teeth and supporting structures, unspecified: Secondary | ICD-10-CM | POA: Insufficient documentation

## 2012-06-15 DIAGNOSIS — A5901 Trichomonal vulvovaginitis: Secondary | ICD-10-CM | POA: Insufficient documentation

## 2012-06-15 DIAGNOSIS — Z3490 Encounter for supervision of normal pregnancy, unspecified, unspecified trimester: Secondary | ICD-10-CM

## 2012-06-15 DIAGNOSIS — N760 Acute vaginitis: Secondary | ICD-10-CM

## 2012-06-15 DIAGNOSIS — R109 Unspecified abdominal pain: Secondary | ICD-10-CM | POA: Insufficient documentation

## 2012-06-15 DIAGNOSIS — A5900 Urogenital trichomoniasis, unspecified: Secondary | ICD-10-CM

## 2012-06-15 DIAGNOSIS — B9689 Other specified bacterial agents as the cause of diseases classified elsewhere: Secondary | ICD-10-CM | POA: Insufficient documentation

## 2012-06-15 DIAGNOSIS — J019 Acute sinusitis, unspecified: Secondary | ICD-10-CM

## 2012-06-15 DIAGNOSIS — A599 Trichomoniasis, unspecified: Secondary | ICD-10-CM

## 2012-06-15 DIAGNOSIS — J329 Chronic sinusitis, unspecified: Secondary | ICD-10-CM | POA: Insufficient documentation

## 2012-06-15 DIAGNOSIS — O98819 Other maternal infectious and parasitic diseases complicating pregnancy, unspecified trimester: Secondary | ICD-10-CM | POA: Insufficient documentation

## 2012-06-15 DIAGNOSIS — A499 Bacterial infection, unspecified: Secondary | ICD-10-CM | POA: Insufficient documentation

## 2012-06-15 DIAGNOSIS — O2390 Unspecified genitourinary tract infection in pregnancy, unspecified trimester: Secondary | ICD-10-CM | POA: Insufficient documentation

## 2012-06-15 LAB — URINALYSIS, ROUTINE W REFLEX MICROSCOPIC
Glucose, UA: NEGATIVE mg/dL
Ketones, ur: NEGATIVE mg/dL
Nitrite: NEGATIVE
Specific Gravity, Urine: 1.02 (ref 1.005–1.030)
pH: 6 (ref 5.0–8.0)

## 2012-06-15 LAB — CBC
HCT: 31.9 % — ABNORMAL LOW (ref 36.0–46.0)
Hemoglobin: 10.5 g/dL — ABNORMAL LOW (ref 12.0–15.0)
RBC: 3.99 MIL/uL (ref 3.87–5.11)
WBC: 17.3 10*3/uL — ABNORMAL HIGH (ref 4.0–10.5)

## 2012-06-15 LAB — WET PREP, GENITAL

## 2012-06-15 LAB — URINE MICROSCOPIC-ADD ON

## 2012-06-15 LAB — HCG, QUANTITATIVE, PREGNANCY: hCG, Beta Chain, Quant, S: 32641 m[IU]/mL — ABNORMAL HIGH (ref <5)

## 2012-06-15 MED ORDER — METRONIDAZOLE 500 MG PO TABS: 500.0000 mg | ORAL_TABLET | Freq: Two times a day (BID) | ORAL | Status: DC

## 2012-06-15 MED ORDER — OXYCODONE-ACETAMINOPHEN 5-325 MG PO TABS
1.0000 | ORAL_TABLET | Freq: Once | ORAL | Status: AC
  Administered 2012-06-15: 1 via ORAL
  Filled 2012-06-15: qty 1

## 2012-06-15 NOTE — MAU Provider Note (Signed)
Attestation of Attending Supervision of Advanced Practitioner (PA/CNM/NP): Evaluation and management procedures were performed by the Advanced Practitioner under my supervision and collaboration.  I have reviewed the Advanced Practitioner's note and chart, and I agree with the management and plan.  Ainsley Sanguinetti, MD, FACOG Attending Obstetrician & Gynecologist Faculty Practice, [facility]   

## 2012-06-15 NOTE — MAU Provider Note (Signed)
Chief Complaint: Possible Pregnancy, Abdominal Pain and Dental Pain   First Provider Initiated Contact with Patient 06/15/12 0720     SUBJECTIVE HPI: Monique Harris is a 22 y.o. [id number] at [redacted]w[redacted]d by LMP who presents to maternity admissions reporting abdominal pain since yesterday and sinus pain around her right eye also starting yesterday.  Patient's last menstrual period was 03/24/2012. She is sure of this date but reports some irregularity of her periods since her delivery (by C/S) in July 2013.  She denies vaginal bleeding, vaginal itching/burning, urinary symptoms, h/a, dizziness, n/v, or fever/chills.     Past Medical History  Diagnosis Date  . No pertinent past medical history   . Pregnant   . Abnormal Pap smear    Past Surgical History  Procedure Date  . No past surgeries   . Cesarean section 01/02/2012    NRFHT, Abruption=5%  . Cesarean section 01/02/2012    Procedure: CESAREAN SECTION;  Surgeon: Tilda Burrow, MD;  Location: [facility];  Service: Gynecology;  Laterality: N/A;  Primary cesarean section of baby boy ay 0125 APGAR 8/9   History   Social History  . Marital Status: Single    Spouse Name: N/A    Number of Children: N/A  . Years of Education: N/A   Occupational History  . Not on file.   Social History Main Topics  . Smoking status: Former Smoker -- 0.2 packs/day    Types: Cigarettes    Quit date: 05/12/2011  . Smokeless tobacco: Never Used  . Alcohol Use: No  . Drug Use: No  . Sexually Active: Yes    Birth Control/ Protection: None   Other Topics Concern  . Not on file   Social History Narrative  . No narrative on file   No current facility-administered medications on file prior to encounter.   No current outpatient prescriptions on file prior to encounter.   No Known Allergies  ROS: Pertinent items in HPI  OBJECTIVE Blood pressure 115/63, pulse 92, temperature 97.9 F (36.6 C), temperature source Oral, resp. rate 16, height 5\' 6"  (1.676 m),  weight 51.256 kg (113 lb), last menstrual period 03/24/2012, not currently breastfeeding. GENERAL: Well-developed, well-nourished female in no acute distress.  HEENT: Normocephalic Eyes - pupils equal and reactive, extraocular eye movements intact Neck - supple, no significant adenopathy Lymphatics - no palpable lymphadenopathy HEART: normal rate RESP: normal effort ABDOMEN: Soft, non-tender EXTREMITIES: Nontender, no edema NEURO: Alert and oriented Pelvic exam: Cervix pink, visually closed, without lesion, moderate amount thick yellow discharge, vaginal walls and external genitalia normal Bimanual exam: Cervix 0/long/high, firm, posterior, neg CMT, uterus tender, slightly enlarged, adnexa without tenderness, enlargement, or mass  LAB RESULTS Results for orders placed during the hospital encounter of 06/15/12 (from the past 24 hour(s))  CBC     Status: Abnormal   Collection Time   06/15/12  6:50 AM      Component Value Range   WBC 17.3 (*) 4.0 - 10.5 K/uL   RBC 3.99  3.87 - 5.11 MIL/uL   Hemoglobin 10.5 (*) 12.0 - 15.0 g/dL   HCT 19.1 (*) 47.8 - 29.5 %   MCV 79.9  78.0 - 100.0 fL   MCH 26.3  26.0 - 34.0 pg   MCHC 32.9  30.0 - 36.0 g/dL   RDW 62.1 (*) 30.8 - 65.7 %   Platelets 283  150 - 400 K/uL  POCT PREGNANCY, URINE     Status: Abnormal   Collection Time  06/15/12  6:52 AM      Component Value Range   Preg Test, Ur POSITIVE (*) NEGATIVE   Report given to Joseph Berkshire, PA  Sharen Counter Certified Nurse-Midwife 06/15/2012  7:22 AM

## 2012-06-15 NOTE — MAU Provider Note (Signed)
1191 - care assumed from Sharen Counter, CNM; Patient in [place]  S: [name] is a 22 y.o. G5 P4 at [redacted]w[redacted]d who presents to MAU today for evaluation of abdominal cramping. The patient also states that she has had "tooth pain" recently which is now causing pain in her right maxillary sinus and temple. She has not had any swelling. She also states that she has a sore throat today. The patient had taken ibuprofen and aspirin at home for her pain today.   O: BP 115/63  Pulse 92  Temp 97.9 F (36.6 C) (Oral)  Resp 16  Ht 5\' 6"  (1.676 m)  Wt 113 lb (51.256 kg)  BMI 18.24 kg/m2  LMP 03/24/2012  Breastfeeding? No GENERAL: Well-developed, thin female in no acute distress.  HEENT: Normocephalic, atraumatic. Tenderness to palpation of the right maxillary sinus. Irritation of the nasal canal bilaterally and posterior pharynx secondary to post nasal drip.  LUNGS: Normal effort.  HEART: Regular rate.    Results for orders placed during the hospital encounter of 06/15/12 (from the past 24 hour(s))  URINALYSIS, ROUTINE W REFLEX MICROSCOPIC     Status: Abnormal   Collection Time   06/15/12  6:43 AM      Component Value Range   Color, Urine YELLOW  YELLOW   APPearance CLEAR  CLEAR   Specific Gravity, Urine 1.020  1.005 - 1.030   pH 6.0  5.0 - 8.0   Glucose, UA NEGATIVE  NEGATIVE mg/dL   Hgb urine dipstick NEGATIVE  NEGATIVE   Bilirubin Urine NEGATIVE  NEGATIVE   Ketones, ur NEGATIVE  NEGATIVE mg/dL   Protein, ur NEGATIVE  NEGATIVE mg/dL   Urobilinogen, UA 0.2  0.0 - 1.0 mg/dL   Nitrite NEGATIVE  NEGATIVE   Leukocytes, UA SMALL (*) NEGATIVE  URINE MICROSCOPIC-ADD ON     Status: Normal   Collection Time   06/15/12  6:43 AM      Component Value Range   Squamous Epithelial / LPF RARE  RARE   WBC, UA 3-6  <3 WBC/hpf   Urine-Other MUCOUS PRESENT    CBC     Status: Abnormal   Collection Time   06/15/12  6:50 AM      Component Value Range   WBC 17.3 (*) 4.0 - 10.5 K/uL   RBC 3.99  3.87 -  5.11 MIL/uL   Hemoglobin 10.5 (*) 12.0 - 15.0 g/dL   HCT 47.8 (*) 29.5 - 62.1 %   MCV 79.9  78.0 - 100.0 fL   MCH 26.3  26.0 - 34.0 pg   MCHC 32.9  30.0 - 36.0 g/dL   RDW 30.8 (*) 65.7 - 84.6 %   Platelets 283  150 - 400 K/uL  HCG, QUANTITATIVE, PREGNANCY     Status: Abnormal   Collection Time   06/15/12  6:52 AM      Component Value Range   hCG, Beta Chain, Quant, S 32641 (*) <5 mIU/mL  POCT PREGNANCY, URINE     Status: Abnormal   Collection Time   06/15/12  6:52 AM      Component Value Range   Preg Test, Ur POSITIVE (*) NEGATIVE  WET PREP, GENITAL     Status: Abnormal   Collection Time   06/15/12  7:16 AM      Component Value Range   Yeast Wet Prep HPF POC NONE SEEN  NONE SEEN   Trich, Wet Prep MANY (*) NONE SEEN   Clue Cells Wet Prep HPF  POC MANY (*) NONE SEEN   WBC, Wet Prep HPF POC MANY (*) NONE SEEN   Imaging:   A: Single, living IUP at 6w 6d Trichomonas BV Acute viral sinusitis  P: Discharge home Rx for Flagyl sent to patient's pharmacy Discussed OTC medications for sinusitis symptoms that are safe in pregnancy Patient may return to MAU as needed  Freddi Starr, PA-C 06/15/2012 10:35 AM

## 2012-06-15 NOTE — MAU Note (Signed)
Pt LMP 03/24/2012, +UPT at home, having abd pain x 2 days, denies bleeding.  Toothache x 2 days with swelling in face and throat.

## 2012-06-17 LAB — GC/CHLAMYDIA PROBE AMP
CT Probe RNA: NEGATIVE
GC Probe RNA: POSITIVE — AB

## 2012-06-18 ENCOUNTER — Other Ambulatory Visit: Payer: Self-pay | Admitting: Advanced Practice Midwife

## 2012-06-18 MED ORDER — METRONIDAZOLE 500 MG PO TABS: 500.0000 mg | ORAL_TABLET | Freq: Two times a day (BID) | ORAL | Status: DC

## 2012-06-18 NOTE — Progress Notes (Signed)
New rx for flagyl sent to [facility] on [address] rather than on [facility], per patient request.

## 2012-06-25 NOTE — MAU Provider Note (Signed)
Attestation of Attending Supervision of Advanced Practitioner (CNM/NP): Evaluation and management procedures were performed by the Advanced Practitioner under my supervision and collaboration.  I have reviewed the Advanced Practitioner's note and chart, and I agree with the management and plan.  HARRAWAY-Melka, Paxton Binns 11:44 AM     

## 2013-01-30 IMAGING — US US OB COMP +14 WK
1 series · 12 of 28 positions shown · non-contrast
Comparison: none

[Series 1: us ob comp +14 wk · 12 of 71 slices shown]
[im 3/71]
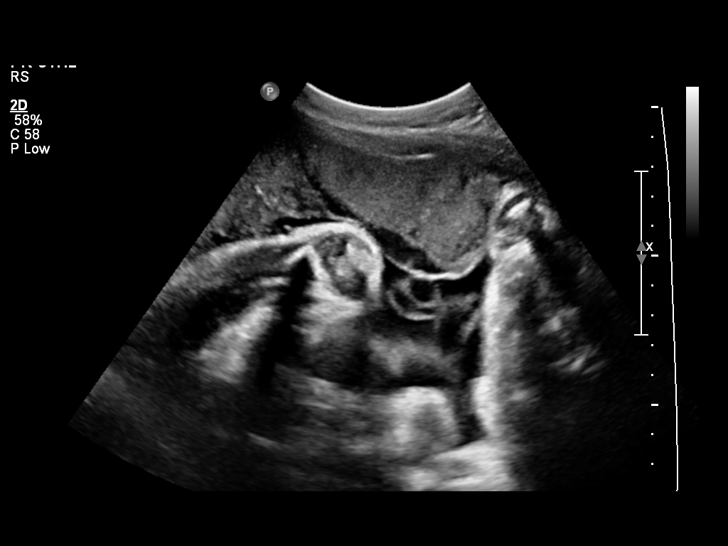
[im 8/71]
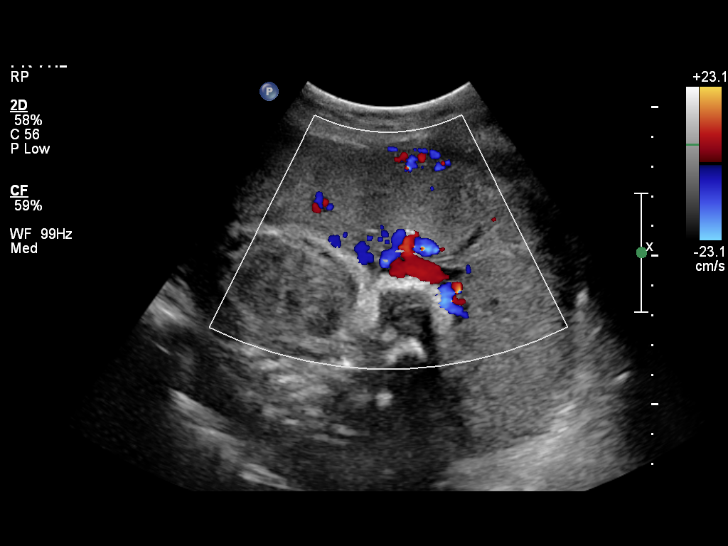
[im 13/71]
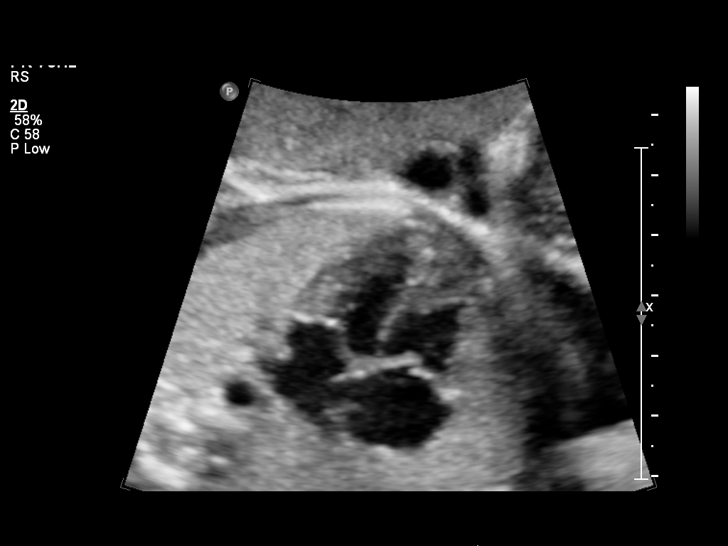
[im 21/71]
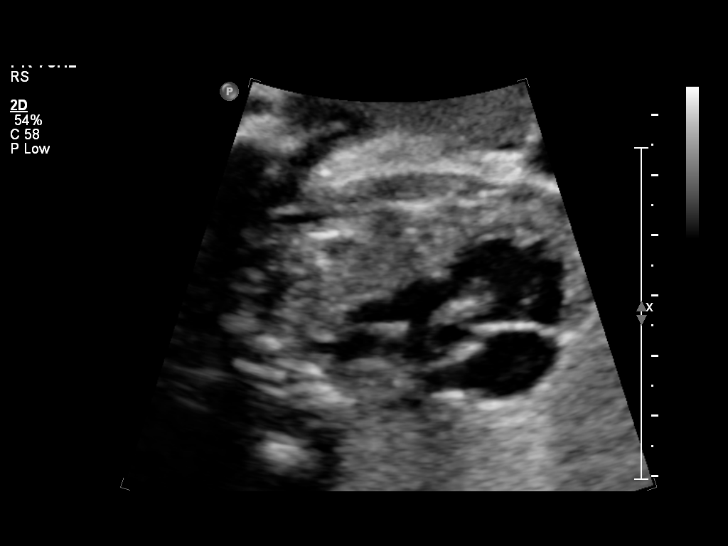
[im 26/71]
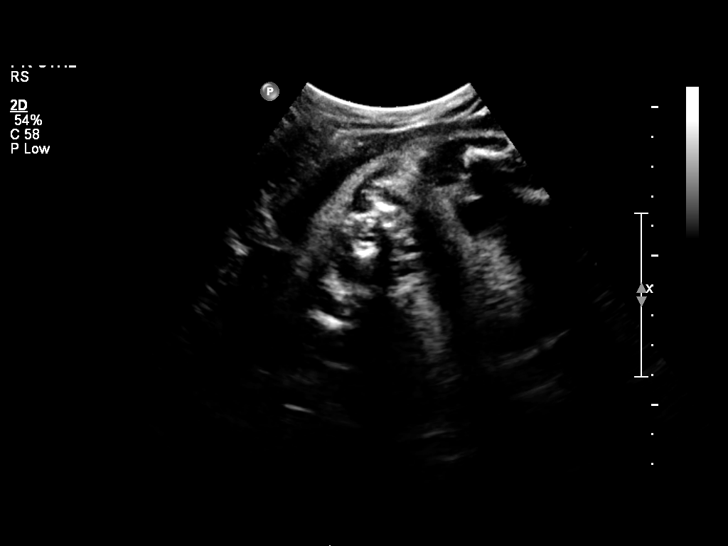
[im 32/71]
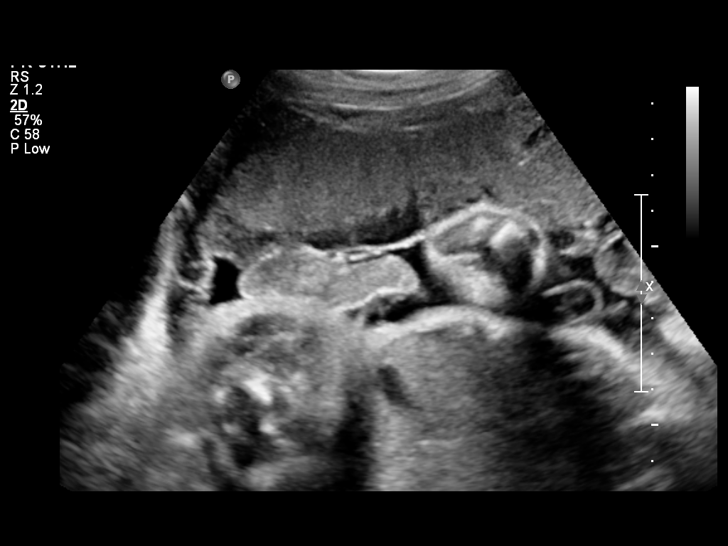
[im 39/71]
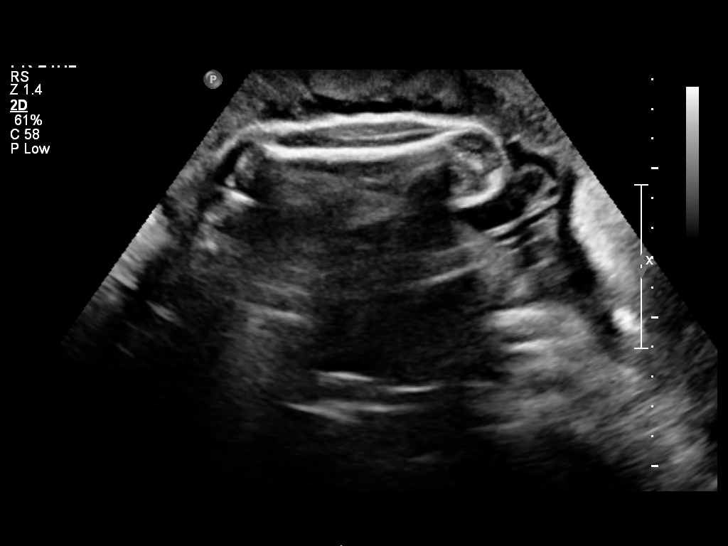
[im 45/71]
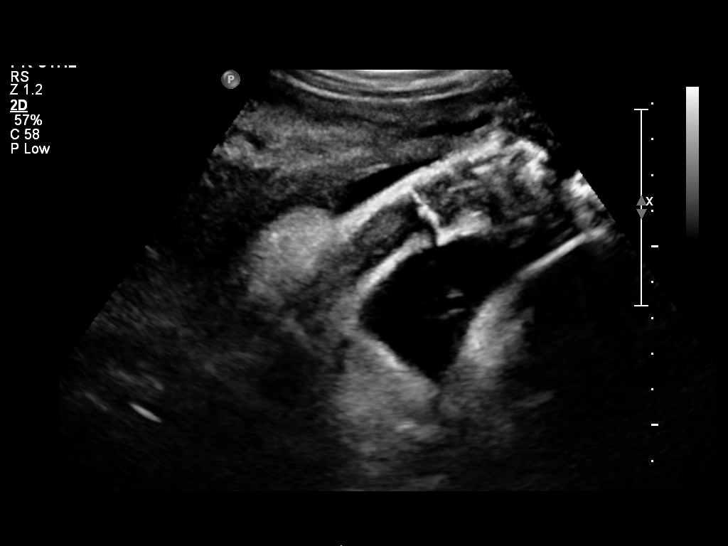
[im 50/71]
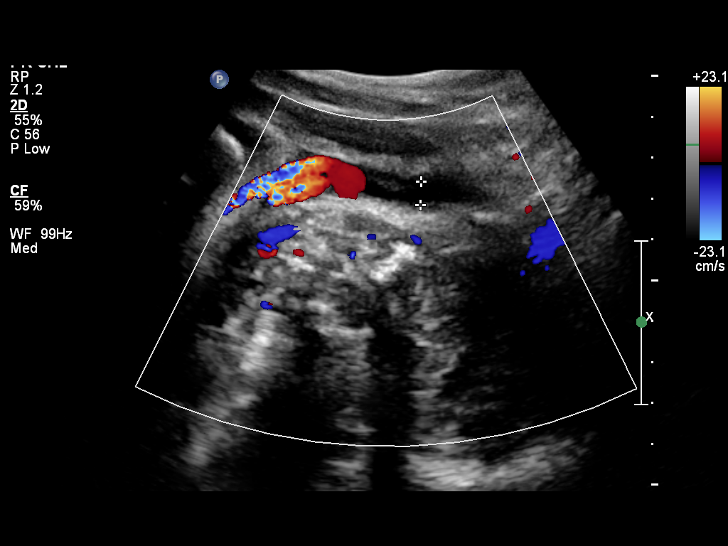
[im 58/71]
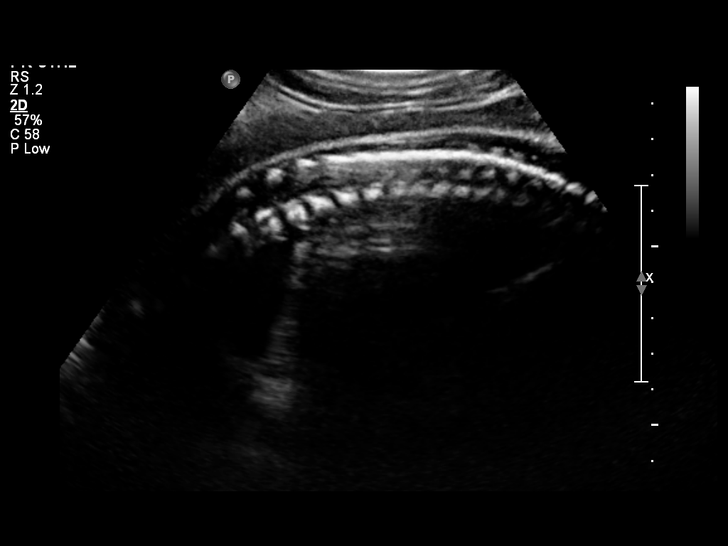
[im 63/71]
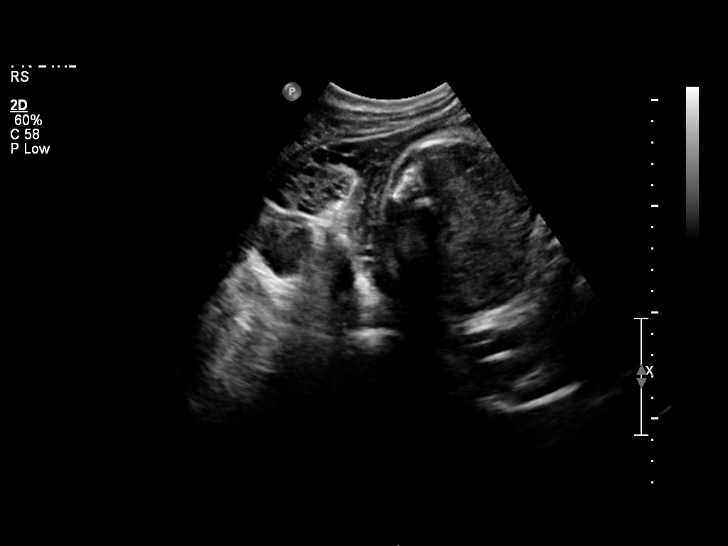
[im 68/71]
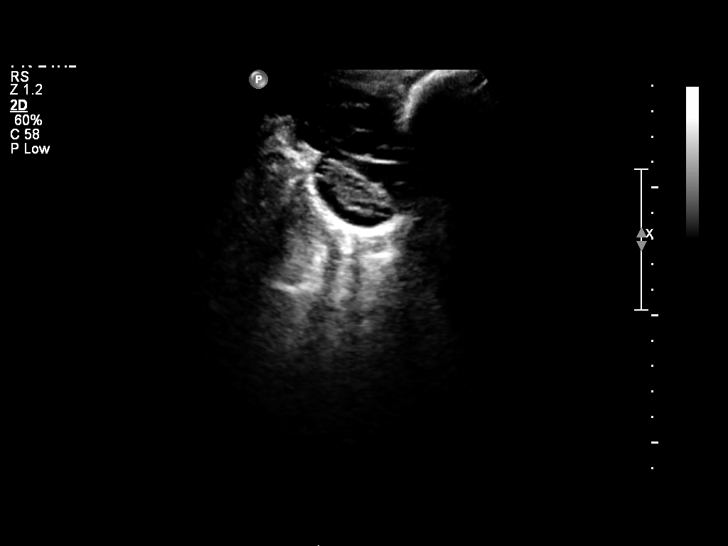

[12 of 28 positions shown; findings below may reference images not displayed]

OBSTETRICS REPORT
                      (Signed Final 11/16/2011 [DATE])

 Order#:         [id number]
Procedures

 US OB COMP + 14 WK                                    76805.1
Indications

 No or Little Prenatal Care
 Basic anatomic survey
Fetal Evaluation

 Fetal Heart Rate:  134                         bpm
 Cardiac Activity:  Observed
 Presentation:      Cephalic
 Placenta:          Anterior, above cervical os
 P. Cord            Visualized
 Insertion:

 Amniotic Fluid
 AFI FV:      Subjectively low-normal
 AFI Sum:     9.88    cm      16   %Tile     Larg Pckt:   5.04   cm
 RUQ:   2.27   cm    RLQ:    2      cm    LUQ:   5.04    cm   LLQ:    0.57   cm
Biometry

 BPD:       77  mm    G. Age:   30w 6d                CI:        72.38   70 - 86
                                                      FL/HC:      21.4   19.1 -

 HC:     287.9  mm    G. Age:   31w 5d        6  %    HC/AC:      1.09   0.96 -

 AC:       265  mm    G. Age:   30w 4d        8  %    FL/BPD:     79.9   71 - 87
 FL:      61.5  mm    G. Age:   31w 6d       24  %    FL/AC:      23.2   20 - 24

 Est. FW:    9092  gm    3 lb 12 oz      32  %
Gestational Age

 U/S Today:     31w 2d                                        EDD:   01/16/12
 Best:          32w 3d    Det. By:   Early Ultrasound         EDD:   01/08/12
Anatomy

 Cranium:           Appears normal      Aortic Arch:       Basic anatomy
                                                           exam per order
 Fetal Cavum:       Appears normal      Ductal Arch:       Basic anatomy
                                                           exam per order
 Ventricles:        Appears normal      Diaphragm:         Appears normal
 Choroid Plexus:    Appears normal      Stomach:           Appears normal
 Cerebellum:        Not well            Abdomen:           Appears normal
                    visualized
 Posterior Fossa:   Not well            Abdominal Wall:    Not well
                    visualized                             visualized
 Nuchal Fold:       Not applicable      Cord Vessels:      Appears normal
                    (>20 wks GA)                           (3 vessel cord)
 Face:              Lips appear         Kidneys:           Appear normal
                    normal (basic
                    anatomy exam)
 Heart:             Appears normal      Bladder:           Appears normal
                    (4 chamber &
                    axis)
 RVOT:              Appears normal      Spine:             Limited views
                                                           appear normal
 LVOT:              Appears normal      Limbs:             Four extremities
                                                           seen

 Other:     Male gender. Technically difficult due to advanced GA
            and fetal position.
Cervix Uterus Adnexa

 Cervical Length:   3.89      cm

 Cervix:       Normal appearance by transabdominal scan.

 Left Ovary:   Within normal limits.
 Right Ovary:  Within normal limits.
 Adnexa:     No abnormality visualized.
Impression

 Single living intrauterine pregnancy in cephalic presentation.
 The estimated gestational age is 32w 3d based on Early
 Ultrasound. No fetal anomalies are identified.

 questions or concerns.

## 2013-03-16 ENCOUNTER — Emergency Department (HOSPITAL_COMMUNITY)
Admission: EM | Admit: 2013-03-16 | Discharge: 2013-03-16 | Discharge status: Absent without leave | Payer: Medicaid Other | Source: Home / Self Care

## 2013-03-16 ENCOUNTER — Emergency Department (HOSPITAL_COMMUNITY)
Admission: EM | Admit: 2013-03-16 | Discharge: 2013-03-16 | Disposition: A | Discharge status: Home / Self Care | Payer: Medicaid Other | Attending: Emergency Medicine | Admitting: Emergency Medicine

## 2013-03-16 ENCOUNTER — Encounter (HOSPITAL_COMMUNITY): Payer: Self-pay | Admitting: Physical Medicine and Rehabilitation

## 2013-03-16 DIAGNOSIS — K089 Disorder of teeth and supporting structures, unspecified: Secondary | ICD-10-CM | POA: Insufficient documentation

## 2013-03-16 DIAGNOSIS — K0889 Other specified disorders of teeth and supporting structures: Secondary | ICD-10-CM

## 2013-03-16 DIAGNOSIS — K029 Dental caries, unspecified: Secondary | ICD-10-CM

## 2013-03-16 DIAGNOSIS — Z87891 Personal history of nicotine dependence: Secondary | ICD-10-CM | POA: Insufficient documentation

## 2013-03-16 MED ORDER — PENICILLIN V POTASSIUM 500 MG PO TABS: 500.0000 mg | ORAL_TABLET | Freq: Four times a day (QID) | ORAL | Status: AC

## 2013-03-16 MED ORDER — HYDROCODONE-ACETAMINOPHEN 5-325 MG PO TABS: 1.0000 | ORAL_TABLET | Freq: Four times a day (QID) | ORAL | Status: DC | PRN

## 2013-03-16 MED ORDER — IBUPROFEN 800 MG PO TABS: 800.0000 mg | ORAL_TABLET | Freq: Three times a day (TID) | ORAL | Status: DC

## 2013-03-16 NOTE — ED Provider Notes (Signed)
CSN: [id number]     Arrival date & time 03/16/13  1234 History  This chart was scribed for non-physician practitioner, Trixie Dredge, PA-C working with Shanna Cisco, MD by Greggory Stallion, ED scribe. This patient was seen in room TR05C/TR05C and the patient's care was started at 1:20 PM.   Chief Complaint  Patient presents with  . Dental Pain   The history is provided by the patient. No language interpreter was used.    HPI Comments: Monique Harris is a 22 y.o. female who presents to the Emergency Department complaining of gradual onset, intermittent bilateral upper molar pain that started one year ago while she was pregnant. Her most recent episode has lasted two months. She rates the pain 5/10. Pt states she has a nasty taste in her mouth that tastes like something is rotten. Pt has taken ibuprofen and tylenol with no relief. She denies fever, chills and body aches. Pt states she has been trying to get an appointment with a dentist but has not been able to see one.  Denies being pregnant currently. Denies breastfeeding.   Past Medical History  Diagnosis Date  . Pregnant   . Abnormal Pap smear    Past Surgical History  Procedure Laterality Date  . Cesarean section  01/02/2012    NRFHT, Abruption=5%  . Cesarean section  01/02/2012    Procedure: CESAREAN SECTION;  Surgeon: Tilda Burrow, MD;  Location: [facility];  Service: Gynecology;  Laterality: N/A;  Primary cesarean section of baby boy ay 0125 APGAR 8/9   Family History  Problem Relation Age of Onset  . Anesthesia problems Neg Hx   . Other Neg Hx    History  Substance Use Topics  . Smoking status: Former Smoker -- 0.25 packs/day    Types: Cigarettes    Quit date: 05/12/2011  . Smokeless tobacco: Never Used  . Alcohol Use: No   OB History   Grav Para Term Preterm Abortions TAB SAB Ect Mult Living   5 4 4  0 0 0 0 0 0 4     Review of Systems  Constitutional: Negative for fever and chills.  HENT: Positive for dental  problem. Negative for sore throat, facial swelling and trouble swallowing.   Respiratory: Negative for shortness of breath.   All other systems reviewed and are negative.    Allergies  Review of patient's allergies indicates no known allergies.  Home Medications   Current Outpatient Rx  Name  Route  Sig  Dispense  Refill  . metroNIDAZOLE (FLAGYL) 500 MG tablet   Oral   Take 1 tablet (500 mg total) by mouth 2 (two) times daily.   14 tablet   0    BP 101/64  Pulse 92  Temp(Src) 98.8 F (37.1 C) (Oral)  Resp 18  SpO2 100%  LMP 03/24/2012  Physical Exam  Nursing note and vitals reviewed. Constitutional: She appears well-developed and well-nourished. No distress.  HENT:  Head: Normocephalic and atraumatic.  Mouth/Throat: Oropharynx is clear and moist.  Upper left second and third molar with decay almost to gumline. Right upper third molar with decay almost to gumline. Tenderness with percussion.   Neck: Neck supple.  Pulmonary/Chest: Effort normal.  Neurological: She is alert.  Skin: She is not diaphoretic.    ED Course  Procedures (including critical care time)  DIAGNOSTIC STUDIES: Oxygen Saturation is 100% on RA, normal by my interpretation.    COORDINATION OF CARE: 1:23 PM-Discussed treatment plan which includes pain medication  and an antibiotic with pt at bedside and pt agreed to plan. Advised pt to follow up with a dentist.   Labs Review Labs Reviewed - No data to display Imaging Review No results found.  Patient denies breastfeeding or possibility that she may be pregnant currently.   MDM   1. Pain, dental   2. Dental caries    Pt with dental pain that is chronic and ongoing, appears to be worse currently.  No obvious abscess but will cover with antibiotics.  D/C home with penVK, vicodin, ibuprofen, dental referral.  Discussed  findings, treatment, and follow up  with patient.  Pt given return precautions.  Pt verbalizes understanding and agrees with  plan.      I doubt any other EMC precluding discharge at this time including, but not necessarily limited to the following:  Deep space head or neck infection, ludwig's angina  I personally performed the services described in this documentation, which was scribed in my presence. The recorded information has been reviewed and is accurate.   Trixie Dredge, PA-C 03/16/13 1550

## 2013-03-16 NOTE — ED Provider Notes (Signed)
Medical screening examination/treatment/procedure(s) were performed by non-physician practitioner and as supervising physician I was immediately available for consultation/collaboration.  Kairee Isa E Daiana Vitiello, MD 03/16/13 1728 

## 2013-03-16 NOTE — ED Notes (Signed)
Pt presents to department for evaluation of bilateral upper molar pain. Ongoing x1 year. 5/10 pain upon arrival. No facial swelling. Airway intact. NAD.

## 2013-03-16 NOTE — ED Notes (Signed)
Discharge instructions reviewed. Pt verbalized understanding.  

## 2013-03-16 NOTE — ED Notes (Addendum)
Pt c/o bilateral upper molar pain x 8yr. Pt states she has not been to see a dentist in a while. Pt states she has tried ibuprofen but has had no relief. Pt rates pain 5/10. No visible swelling noted.

## 2013-04-20 ENCOUNTER — Encounter (HOSPITAL_COMMUNITY): Payer: Self-pay | Admitting: *Deleted

## 2013-05-15 ENCOUNTER — Emergency Department (HOSPITAL_COMMUNITY)
Admission: EM | Admit: 2013-05-15 | Discharge: 2013-05-15 | Disposition: A | Discharge status: Home / Self Care | Payer: Medicaid Other | Attending: Emergency Medicine | Admitting: Emergency Medicine

## 2013-05-15 ENCOUNTER — Encounter (HOSPITAL_COMMUNITY): Payer: Self-pay | Admitting: Emergency Medicine

## 2013-05-15 DIAGNOSIS — A499 Bacterial infection, unspecified: Secondary | ICD-10-CM | POA: Insufficient documentation

## 2013-05-15 DIAGNOSIS — Z79899 Other long term (current) drug therapy: Secondary | ICD-10-CM | POA: Insufficient documentation

## 2013-05-15 DIAGNOSIS — B9689 Other specified bacterial agents as the cause of diseases classified elsewhere: Secondary | ICD-10-CM | POA: Insufficient documentation

## 2013-05-15 DIAGNOSIS — Z3202 Encounter for pregnancy test, result negative: Secondary | ICD-10-CM | POA: Insufficient documentation

## 2013-05-15 DIAGNOSIS — Z202 Contact with and (suspected) exposure to infections with a predominantly sexual mode of transmission: Secondary | ICD-10-CM | POA: Insufficient documentation

## 2013-05-15 DIAGNOSIS — Z87891 Personal history of nicotine dependence: Secondary | ICD-10-CM | POA: Insufficient documentation

## 2013-05-15 DIAGNOSIS — N760 Acute vaginitis: Secondary | ICD-10-CM | POA: Insufficient documentation

## 2013-05-15 DIAGNOSIS — A5901 Trichomonal vulvovaginitis: Secondary | ICD-10-CM | POA: Insufficient documentation

## 2013-05-15 LAB — URINE MICROSCOPIC-ADD ON

## 2013-05-15 LAB — URINALYSIS, ROUTINE W REFLEX MICROSCOPIC
Bilirubin Urine: NEGATIVE
Glucose, UA: NEGATIVE mg/dL
Hgb urine dipstick: NEGATIVE
Ketones, ur: NEGATIVE mg/dL
Nitrite: NEGATIVE
Protein, ur: NEGATIVE mg/dL
Specific Gravity, Urine: 1.015 (ref 1.005–1.030)
Urobilinogen, UA: 1 mg/dL (ref 0.0–1.0)
pH: 7 (ref 5.0–8.0)

## 2013-05-15 LAB — WET PREP, GENITAL: Yeast Wet Prep HPF POC: NONE SEEN

## 2013-05-15 MED ORDER — METRONIDAZOLE 500 MG PO TABS: 500.0000 mg | ORAL_TABLET | Freq: Two times a day (BID) | ORAL | Status: DC

## 2013-05-15 MED ORDER — AZITHROMYCIN 250 MG PO TABS
1000.0000 mg | ORAL_TABLET | Freq: Once | ORAL | Status: AC
  Administered 2013-05-15: 1000 mg via ORAL
  Filled 2013-05-15: qty 4

## 2013-05-15 MED ORDER — LIDOCAINE HCL (PF) 1 % IJ SOLN
INTRAMUSCULAR | Status: AC
  Filled 2013-05-15: qty 5

## 2013-05-15 MED ORDER — LIDOCAINE HCL (PF) 1 % IJ SOLN
INTRAMUSCULAR | Status: AC
  Administered 2013-05-15: 5 mL
  Filled 2013-05-15: qty 5

## 2013-05-15 MED ORDER — CEFTRIAXONE SODIUM 250 MG IJ SOLR
250.0000 mg | Freq: Once | INTRAMUSCULAR | Status: AC
  Administered 2013-05-15: 250 mg via INTRAMUSCULAR
  Filled 2013-05-15: qty 250

## 2013-05-15 NOTE — ED Provider Notes (Signed)
Medical screening examination/treatment/procedure(s) were performed by non-physician practitioner and as supervising physician I was immediately available for consultation/collaboration.  EKG Interpretation   None         Gavin Pound. Nesta Scaturro, MD 05/15/13 1131

## 2013-05-15 NOTE — ED Notes (Signed)
Pt. Having lower back pain and pelvic pain.  Denies any UTI symptoms . Having mild vaginal discharge.

## 2013-05-15 NOTE — ED Provider Notes (Signed)
CSN: [id number]     Arrival date & time 05/15/13  0919 History   First MD Initiated Contact with Patient 05/15/13 1013     Chief Complaint  Patient presents with  . Abdominal Pain   (Consider location/radiation/quality/duration/timing/severity/associated sxs/prior Treatment) HPI Comments: Pt is a 22 y/o female who presents to the ED complaining of lower abdominal pain and vaginal discharge x 3 days. Pain described as cramping, 3/10 currently, radiating to her lower back. Tried soaking in a warm bath with minimal relief. Vaginal discharge present after intercourse, white, malodorous. Has had 2 sexual partners over the past month, both unprotected intercourse. She would like to be treated for STDs. Denies fever, chills, nausea, vomiting, increased urinary frequency, urgency, dysuria or vaginal bleeding. LMP began 11/8 and was normal for her.  Patient is a 22 y.o. female presenting with abdominal pain. The history is provided by the patient.  Abdominal Pain Associated symptoms: vaginal discharge     Past Medical History  Diagnosis Date  . Pregnant   . Abnormal Pap smear    Past Surgical History  Procedure Laterality Date  . Cesarean section  01/02/2012    NRFHT, Abruption=5%  . Cesarean section  01/02/2012    Procedure: CESAREAN SECTION;  Surgeon: Tilda Burrow, MD;  Location: [facility];  Service: Gynecology;  Laterality: N/A;  Primary cesarean section of baby boy ay 0125 APGAR 8/9   Family History  Problem Relation Age of Onset  . Anesthesia problems Neg Hx   . Other Neg Hx    History  Substance Use Topics  . Smoking status: Former Smoker -- 0.25 packs/day    Types: Cigarettes    Quit date: 05/12/2011  . Smokeless tobacco: Never Used  . Alcohol Use: No   OB History   Grav Para Term Preterm Abortions TAB SAB Ect Mult Living   5 4 4  0 0 0 0 0 0 4     Review of Systems  Gastrointestinal: Positive for abdominal pain.  Genitourinary: Positive for vaginal discharge.  All other  systems reviewed and are negative.    Allergies  Review of patient's allergies indicates no known allergies.  Home Medications   Current Outpatient Rx  Name  Route  Sig  Dispense  Refill  . metroNIDAZOLE (FLAGYL) 500 MG tablet   Oral   Take 1 tablet (500 mg total) by mouth 2 (two) times daily. One po bid x 7 days   14 tablet   0    BP 104/64  Pulse 103  Temp(Src) 97 F (36.1 C) (Oral)  Resp 18  Ht 5\' 3"  (1.6 m)  Wt 113 lb (51.256 kg)  BMI 20.02 kg/m2  SpO2 100%  LMP 04/20/2013 Physical Exam  Nursing note and vitals reviewed. Constitutional: She is oriented to person, place, and time. She appears well-developed and well-nourished. No distress.  HENT:  Head: Normocephalic and atraumatic.  Mouth/Throat: Oropharynx is clear and moist.  Eyes: Conjunctivae are normal.  Neck: Normal range of motion. Neck supple.  Cardiovascular: Normal rate, regular rhythm and normal heart sounds.   No tachycardia on my examination.  Pulmonary/Chest: Effort normal and breath sounds normal.  Abdominal: Soft. Normal appearance and bowel sounds are normal. She exhibits no distension. There is tenderness in the suprapubic area. There is no rigidity, no rebound and no guarding.  No peritoneal signs.  Genitourinary: Uterus normal. Cervix exhibits discharge (white). Cervix exhibits no motion tenderness. Right adnexum displays no mass, no tenderness and no fullness. Left adnexum  displays no mass, no tenderness and no fullness. No erythema or bleeding around the vagina. Vaginal discharge (copious, white, malodorous) found.  Musculoskeletal: Normal range of motion. She exhibits no edema.  Neurological: She is alert and oriented to person, place, and time.  Skin: Skin is warm and dry. She is not diaphoretic.  Psychiatric: She has a normal mood and affect. Her behavior is normal.    ED Course  Procedures (including critical care time) Labs Review Labs Reviewed  WET PREP, GENITAL - Abnormal; Notable  for the following:    Trich, Wet Prep FEW (*)    Clue Cells Wet Prep HPF POC MODERATE (*)    WBC, Wet Prep HPF POC MODERATE (*)    All other components within normal limits  URINALYSIS, ROUTINE W REFLEX MICROSCOPIC - Abnormal; Notable for the following:    APPearance CLOUDY (*)    Leukocytes, UA MODERATE (*)    All other components within normal limits  URINE MICROSCOPIC-ADD ON - Abnormal; Notable for the following:    Squamous Epithelial / LPF FEW (*)    Bacteria, UA FEW (*)    All other components within normal limits  GC/CHLAMYDIA PROBE AMP  POCT PREGNANCY, URINE   Imaging Review No results found.  EKG Interpretation   None       MDM   1. BV (bacterial vaginosis)   2. Trichomonas vaginitis   3. Possible exposure to STD    Patient presenting with suprapubic pain and vaginal discharge. Concern about STD. Will treat prophylactically with Rocephin and azithromycin for GC/Chlamydia. Trichomonas and BV evident on wet prep. Treat with Flagyl. No CMT. Infection care and precautions discussed. Return precautions given. Patient states understanding of plan and is agreeable.    Trevor Mace, PA-C 05/15/13 1104

## 2013-05-16 LAB — GC/CHLAMYDIA PROBE AMP: CT Probe RNA: NEGATIVE

## 2013-05-18 LAB — URINE CULTURE: Colony Count: >=100000

## 2013-05-19 ENCOUNTER — Telehealth (HOSPITAL_COMMUNITY): Payer: Self-pay | Admitting: Emergency Medicine

## 2013-05-19 NOTE — Progress Notes (Signed)
ED Antimicrobial Stewardship Positive Culture Follow Up   Monique Harris is an 22 y.o. female who presented to [facility] on 05/15/2013 with a chief complaint of lower back/pelvic pain with vaginal discharge.   Chief Complaint  Patient presents with  . Abdominal Pain    Recent Results (from the past 720 hour(s))  URINE CULTURE     Status: None   Collection Time    05/15/13 10:01 AM      Result Value Range Status   Specimen Description URINE, RANDOM   Final   Special Requests NONE   Final   Culture  Setup Time     Final   Value: 05/15/2013 11:37     Performed at [facility] Count     Final   Value: >=100,000 COLONIES/ML     Performed at [facility]   Culture     Final   Value: STAPHYLOCOCCUS SPECIES (COAGULASE NEGATIVE)     Note: RIFAMPIN AND GENTAMICIN SHOULD NOT BE USED AS SINGLE DRUGS FOR TREATMENT OF STAPH INFECTIONS.     Performed at [facility]   Report Status 05/18/2013 FINAL   Final   Organism ID, Bacteria STAPHYLOCOCCUS SPECIES (COAGULASE NEGATIVE)   Final  GC/CHLAMYDIA PROBE AMP     Status: None   Collection Time    05/15/13 10:27 AM      Result Value Range Status   CT Probe RNA NEGATIVE  NEGATIVE Final   GC Probe RNA NEGATIVE  NEGATIVE Final   Comment: (NOTE)                                                                                               **Normal Reference Range: Negative**          Assay performed using the Gen-Probe APTIMA COMBO2 (R) Assay.     Acceptable specimen types for this assay include APTIMA Swabs (Unisex,     endocervical, urethral, or vaginal), first void urine, and ThinPrep     liquid based cytology samples.     Performed at [facility]  WET PREP, GENITAL     Status: Abnormal   Collection Time    05/15/13 10:27 AM      Result Value Range Status   Yeast Wet Prep HPF POC NONE SEEN  NONE SEEN Final   Trich, Wet Prep FEW (*) NONE SEEN Final   Clue Cells Wet Prep HPF POC MODERATE (*) NONE  SEEN Final   WBC, Wet Prep HPF POC MODERATE (*) NONE SEEN Final    [x]  Patient discharged originally without antimicrobial agent and treatment is now indicated  29 YOF with lower back/pelvic pain and mild vaginal discharge. The patient admitted to having unprotected sex and wanting treatment for STDs. The patient was given Azithromycin 1g and CTX 250 mg IM in the ED for empiric Chalmydia/Gonorrhea coverage and sent home on Flagyl for BV/trich. The patient is also noted to have 100k of CoNS in the UCx with few squamous cells on the UA -- would recommend going ahead and treating this as well.  New antibiotic  prescription: Bactrim DS 1 tab bid x 5 days >> continue Flagyl as prescribed  ED Provider: Raymon Mutton, PA-C   Rolley Sims 05/19/2013, 9:55 AM Infectious Diseases Pharmacist Phone# [phone]

## 2013-05-19 NOTE — ED Notes (Signed)
Post ED Visit - Positive Culture Follow-up: Successful Patient Follow-Up  Culture assessed and recommendations reviewed by: []  Wes Dulaney, Pharm.D., BCPS []  Celedonio Miyamoto, [address].D., BCPS [x]  Georgina Pillion, [address].D., BCPS []  [place], [address].D., BCPS, AAHIVP []  Estella Husk, Pharm.D., BCPS, AAHIVP  Positive urine culture  [x]  Patient discharged without antimicrobial prescription and treatment is now indicated []  Organism is resistant to prescribed ED discharge antimicrobial []  Patient with positive blood cultures  Changes discussed with ED provider: Raymon Mutton PA-C New antibiotic prescription: Bactrim DS 1 tab bid x 5 days    Kylie A Holland 05/19/2013, 11:10 AM

## 2013-10-05 ENCOUNTER — Encounter (HOSPITAL_COMMUNITY): Payer: Self-pay | Admitting: *Deleted

## 2013-10-05 ENCOUNTER — Inpatient Hospital Stay (HOSPITAL_COMMUNITY)
Admission: AD | Admit: 2013-10-05 | Discharge: 2013-10-05 | Disposition: A | Discharge status: Home / Self Care | Payer: Medicaid Other | Source: Ambulatory Visit | Attending: Obstetrics & Gynecology | Admitting: Obstetrics & Gynecology

## 2013-10-05 DIAGNOSIS — N949 Unspecified condition associated with female genital organs and menstrual cycle: Secondary | ICD-10-CM | POA: Insufficient documentation

## 2013-10-05 DIAGNOSIS — A499 Bacterial infection, unspecified: Secondary | ICD-10-CM | POA: Insufficient documentation

## 2013-10-05 DIAGNOSIS — Z87891 Personal history of nicotine dependence: Secondary | ICD-10-CM | POA: Insufficient documentation

## 2013-10-05 DIAGNOSIS — N72 Inflammatory disease of cervix uteri: Secondary | ICD-10-CM | POA: Insufficient documentation

## 2013-10-05 DIAGNOSIS — B9689 Other specified bacterial agents as the cause of diseases classified elsewhere: Secondary | ICD-10-CM | POA: Insufficient documentation

## 2013-10-05 DIAGNOSIS — N760 Acute vaginitis: Secondary | ICD-10-CM

## 2013-10-05 DIAGNOSIS — R109 Unspecified abdominal pain: Secondary | ICD-10-CM | POA: Insufficient documentation

## 2013-10-05 DIAGNOSIS — R102 Pelvic and perineal pain: Secondary | ICD-10-CM

## 2013-10-05 LAB — POCT PREGNANCY, URINE: PREG TEST UR: NEGATIVE

## 2013-10-05 LAB — URINALYSIS, ROUTINE W REFLEX MICROSCOPIC
Bilirubin Urine: NEGATIVE
Glucose, UA: NEGATIVE mg/dL
Hgb urine dipstick: NEGATIVE
Ketones, ur: NEGATIVE mg/dL
LEUKOCYTES UA: NEGATIVE
NITRITE: NEGATIVE
PH: 6 (ref 5.0–8.0)
Protein, ur: NEGATIVE mg/dL
SPECIFIC GRAVITY, URINE: 1.025 (ref 1.005–1.030)
Urobilinogen, UA: 0.2 mg/dL (ref 0.0–1.0)

## 2013-10-05 LAB — CBC
HCT: 38.3 % (ref 36.0–46.0)
Hemoglobin: 12.7 g/dL (ref 12.0–15.0)
MCH: 27.5 pg (ref 26.0–34.0)
MCHC: 33.2 g/dL (ref 30.0–36.0)
MCV: 83.1 fL (ref 78.0–100.0)
PLATELETS: 415 10*3/uL — AB (ref 150–400)
RBC: 4.61 MIL/uL (ref 3.87–5.11)
RDW: 13.5 % (ref 11.5–15.5)
WBC: 6 10*3/uL (ref 4.0–10.5)

## 2013-10-05 LAB — WET PREP, GENITAL
Trich, Wet Prep: NONE SEEN
Yeast Wet Prep HPF POC: NONE SEEN

## 2013-10-05 LAB — HIV ANTIBODY (ROUTINE TESTING W REFLEX): HIV: NONREACTIVE

## 2013-10-05 LAB — RPR

## 2013-10-05 MED ORDER — CEFTRIAXONE SODIUM 250 MG IJ SOLR
250.0000 mg | Freq: Once | INTRAMUSCULAR | Status: AC
  Administered 2013-10-05: 250 mg via INTRAMUSCULAR
  Filled 2013-10-05: qty 250

## 2013-10-05 MED ORDER — KETOROLAC TROMETHAMINE 60 MG/2ML IM SOLN
60.0000 mg | Freq: Once | INTRAMUSCULAR | Status: AC
  Administered 2013-10-05: 60 mg via INTRAMUSCULAR
  Filled 2013-10-05: qty 2

## 2013-10-05 MED ORDER — AZITHROMYCIN 250 MG PO TABS
1000.0000 mg | ORAL_TABLET | Freq: Once | ORAL | Status: AC
  Administered 2013-10-05: 1000 mg via ORAL
  Filled 2013-10-05: qty 4

## 2013-10-05 MED ORDER — METRONIDAZOLE 500 MG PO TABS: 500.0000 mg | ORAL_TABLET | Freq: Two times a day (BID) | ORAL | Status: DC

## 2013-10-05 NOTE — MAU Provider Note (Signed)
History     CSN: [id number]  Arrival date and time: 10/05/13 [id number]   First Provider Initiated Contact with Patient 10/05/13 0920      Chief Complaint  Patient presents with  . Abdominal Pain   HPIpt is not pregnant, using withdrawal for contraception.  Pt has been with same partner for 6 mos on and off. Pt has heard a rumor that partner has HIV and pt is concerned.  Pt has had spots in her eyes, but no Headache.  Pt has lower abd pain for 2 weeks.  Pt wants to be tested for "everything". Pt denies constipation, diarrhea, N/V or UTI sx.  Pt has RCM.  RN note:   MAU Note Service date: 10/05/2013 9:00 AM   Pt presents with complaints of lower abdominal pain for 2 weeks, and she is having spots in her eyes. She also states she wants to be tested for STDs and     Past Medical History  Diagnosis Date  . Pregnant   . Abnormal Pap smear     Past Surgical History  Procedure Laterality Date  . Cesarean section  01/02/2012    NRFHT, Abruption=5%  . Cesarean section  01/02/2012    Procedure: CESAREAN SECTION;  Surgeon: Tilda BurrowJohn V Ferguson, MD;  Location: [facility];  Service: Gynecology;  Laterality: N/A;  Primary cesarean section of baby boy ay 0125 APGAR 8/9    Family History  Problem Relation Age of Onset  . Anesthesia problems Neg Hx   . Other Neg Hx     History  Substance Use Topics  . Smoking status: Former Smoker -- 0.25 packs/day    Types: Cigarettes    Quit date: 05/12/2011  . Smokeless tobacco: Never Used  . Alcohol Use: No    Allergies: No Known Allergies  No prescriptions prior to admission    Review of Systems  Constitutional: Negative for fever and chills.  Gastrointestinal: Positive for abdominal pain. Negative for nausea, vomiting, diarrhea and constipation.  Genitourinary: Negative for dysuria.  Neurological: Negative for headaches.   Physical Exam   Blood pressure 112/63, pulse 91, temperature 97.5 F (36.4 C), resp. rate 18.  Physical Exam  Nursing  note and vitals reviewed. Constitutional: She is oriented to person, place, and time. She appears well-developed and well-nourished. No distress.  HENT:  Head: Normocephalic.  Eyes: Pupils are equal, round, and reactive to light.  Neck: Normal range of motion. Neck supple.  Cardiovascular: Normal rate.   Respiratory: Effort normal.  GI: Soft.  Genitourinary:  Slightly reddened mucosa with sm amount of watery white discharge in vault; cervix clean; uterus, adnexa without palpable enlargement but diffuse tender.  Musculoskeletal: Normal range of motion.  Neurological: She is alert and oriented to person, place, and time.  Skin: Skin is warm and dry.  Psychiatric: She has a normal mood and affect.    MAU Course  Procedures Results for orders placed during the hospital encounter of 10/05/13 (from the past 24 hour(s))  URINALYSIS, ROUTINE W REFLEX MICROSCOPIC     Status: None   Collection Time    10/05/13  8:49 AM      Result Value Ref Range   Color, Urine YELLOW  YELLOW   APPearance CLEAR  CLEAR   Specific Gravity, Urine 1.025  1.005 - 1.030   pH 6.0  5.0 - 8.0   Glucose, UA NEGATIVE  NEGATIVE mg/dL   Hgb urine dipstick NEGATIVE  NEGATIVE   Bilirubin Urine NEGATIVE  NEGATIVE  Ketones, ur NEGATIVE  NEGATIVE mg/dL   Protein, ur NEGATIVE  NEGATIVE mg/dL   Urobilinogen, UA 0.2  0.0 - 1.0 mg/dL   Nitrite NEGATIVE  NEGATIVE   Leukocytes, UA NEGATIVE  NEGATIVE  POCT PREGNANCY, URINE     Status: None   Collection Time    10/05/13  8:54 AM      Result Value Ref Range   Preg Test, Ur NEGATIVE  NEGATIVE  WET PREP, GENITAL     Status: Abnormal   Collection Time    10/05/13  9:24 AM      Result Value Ref Range   Yeast Wet Prep HPF POC NONE SEEN  NONE SEEN   Trich, Wet Prep NONE SEEN  NONE SEEN   Clue Cells Wet Prep HPF POC FEW (*) NONE SEEN   WBC, Wet Prep HPF POC FEW (*) NONE SEEN  CBC     Status: Abnormal   Collection Time    10/05/13  9:31 AM      Result Value Ref Range   WBC  6.0  4.0 - 10.5 K/uL   RBC 4.61  3.87 - 5.11 MIL/uL   Hemoglobin 12.7  12.0 - 15.0 g/dL   HCT 16.138.3  09.636.0 - 04.546.0 %   MCV 83.1  78.0 - 100.0 fL   MCH 27.5  26.0 - 34.0 pg   MCHC 33.2  30.0 - 36.0 g/dL   RDW 40.913.5  81.111.5 - 91.415.5 %   Platelets 415 (*) 150 - 400 K/uL   HIV RPR CBC Treated for cervicitis with Rocephin and Zithromax  Assessment and Plan  Pelvic pain High Risk for STD F/u for increase pain HIV and RPR will be reported to pt by outside source Pt to f/u in STD clinic for further concerns Pt to f/u with PCP regarding spots in eyes BV- Flagyl 500mg  BID for 7 days  Monique Harris 10/05/2013, 9:52 AM

## 2013-10-05 NOTE — MAU Provider Note (Signed)
Attestation of Attending Supervision of Advanced Practitioner (PA/CNM/NP): Evaluation and management procedures were performed by the Advanced Practitioner under my supervision and collaboration.  I have reviewed the Advanced Practitioner's note and chart, and I agree with the management and plan.  Braylyn Kalter, MD, FACOG Attending Obstetrician & Gynecologist Faculty Practice, [facility]  

## 2013-10-05 NOTE — MAU Note (Signed)
Pt presents with complaints of lower abdominal pain for 2 weeks, and she is having spots in her eyes. She also states she wants to be tested for STDs and HIV.

## 2013-10-08 LAB — GC/CHLAMYDIA PROBE AMP
CT PROBE, AMP APTIMA: NEGATIVE
GC Probe RNA: NEGATIVE

## 2014-04-14 ENCOUNTER — Encounter (HOSPITAL_COMMUNITY): Payer: Self-pay | Admitting: *Deleted

## 2014-07-08 ENCOUNTER — Emergency Department (HOSPITAL_COMMUNITY)
Admission: EM | Admit: 2014-07-08 | Discharge: 2014-07-08 | Disposition: A | Discharge status: Home / Self Care | Payer: Medicaid Other | Attending: Emergency Medicine | Admitting: Emergency Medicine

## 2014-07-08 ENCOUNTER — Emergency Department (HOSPITAL_COMMUNITY): Payer: Medicaid Other

## 2014-07-08 ENCOUNTER — Encounter (HOSPITAL_COMMUNITY): Payer: Self-pay | Admitting: Emergency Medicine

## 2014-07-08 DIAGNOSIS — R0602 Shortness of breath: Secondary | ICD-10-CM | POA: Diagnosis not present

## 2014-07-08 DIAGNOSIS — R079 Chest pain, unspecified: Secondary | ICD-10-CM | POA: Diagnosis present

## 2014-07-08 DIAGNOSIS — R0789 Other chest pain: Secondary | ICD-10-CM | POA: Insufficient documentation

## 2014-07-08 DIAGNOSIS — M549 Dorsalgia, unspecified: Secondary | ICD-10-CM | POA: Diagnosis not present

## 2014-07-08 DIAGNOSIS — Z720 Tobacco use: Secondary | ICD-10-CM | POA: Insufficient documentation

## 2014-07-08 DIAGNOSIS — Z3202 Encounter for pregnancy test, result negative: Secondary | ICD-10-CM | POA: Diagnosis not present

## 2014-07-08 DIAGNOSIS — A5901 Trichomonal vulvovaginitis: Secondary | ICD-10-CM | POA: Diagnosis not present

## 2014-07-08 DIAGNOSIS — Z792 Long term (current) use of antibiotics: Secondary | ICD-10-CM | POA: Diagnosis not present

## 2014-07-08 DIAGNOSIS — A599 Trichomoniasis, unspecified: Secondary | ICD-10-CM

## 2014-07-08 DIAGNOSIS — N898 Other specified noninflammatory disorders of vagina: Secondary | ICD-10-CM

## 2014-07-08 LAB — URINALYSIS, ROUTINE W REFLEX MICROSCOPIC
BILIRUBIN URINE: NEGATIVE
Glucose, UA: NEGATIVE mg/dL
HGB URINE DIPSTICK: NEGATIVE
KETONES UR: NEGATIVE mg/dL
NITRITE: NEGATIVE
Protein, ur: NEGATIVE mg/dL
SPECIFIC GRAVITY, URINE: 1.022 (ref 1.005–1.030)
Urobilinogen, UA: 1 mg/dL (ref 0.0–1.0)
pH: 6.5 (ref 5.0–8.0)

## 2014-07-08 LAB — WET PREP, GENITAL: Yeast Wet Prep HPF POC: NONE SEEN

## 2014-07-08 LAB — URINE MICROSCOPIC-ADD ON

## 2014-07-08 LAB — POC URINE PREG, ED: Preg Test, Ur: NEGATIVE

## 2014-07-08 MED ORDER — LIDOCAINE HCL (PF) 1 % IJ SOLN
0.9000 mL | Freq: Once | INTRAMUSCULAR | Status: AC
Start: 2014-07-08 — End: 2014-07-08
  Administered 2014-07-08: 0.9 mL

## 2014-07-08 MED ORDER — LIDOCAINE HCL (PF) 1 % IJ SOLN
INTRAMUSCULAR | Status: AC
  Filled 2014-07-08: qty 5

## 2014-07-08 MED ORDER — AZITHROMYCIN 250 MG PO TABS
1000.0000 mg | ORAL_TABLET | Freq: Once | ORAL | Status: AC
  Administered 2014-07-08: 1000 mg via ORAL
  Filled 2014-07-08: qty 4

## 2014-07-08 MED ORDER — METRONIDAZOLE 500 MG PO TABS: 500.0000 mg | ORAL_TABLET | Freq: Two times a day (BID) | ORAL | Status: DC

## 2014-07-08 MED ORDER — DOXYCYCLINE HYCLATE 100 MG PO CAPS: 100.0000 mg | ORAL_CAPSULE | Freq: Two times a day (BID) | ORAL | Status: DC

## 2014-07-08 MED ORDER — CEFTRIAXONE SODIUM 250 MG IJ SOLR
250.0000 mg | Freq: Once | INTRAMUSCULAR | Status: AC
  Administered 2014-07-08: 250 mg via INTRAMUSCULAR
  Filled 2014-07-08: qty 250

## 2014-07-08 NOTE — ED Provider Notes (Signed)
CSN: [id number]     Arrival date & time 07/08/14  1223 History   First MD Initiated Contact with Patient 07/08/14 1614     Chief Complaint  Patient presents with  . Abdominal Pain  . Back Pain     (Consider location/radiation/quality/duration/timing/severity/associated sxs/prior Treatment) HPI Comments: Patient presents with complaint of chest pains that have been intermittent for the past month. She describes the chest pain as a pressure in the middle of her chest that comes on suddenly and last for 7-10 minutes. Pain happens almost daily area and it is associated with nausea and shortness of breath when she has the pain. She states that she thinks it is due to stress. No lightheadedness or syncope. No vomiting. No fever or cough. Patient denies risk factors for pulmonary embolism including: unilateral leg swelling, history of DVT/PE/other blood clots, use of estrogens, recent immobilizations, recent surgery, recent travel (>4hr segment), malignancy, hemoptysis.   Patient is also concerned that she has been exposed to HIV. She is uncertain of her partner's status but heard a rumor that he could be HIV positive. She has had lower abdominal cramping at times. Patient states that she is approximately one week late for her menstrual period currently. She complains of vaginal discharge and pain that radiates to her back. No dysuria, hematuria. She requests testing and treatment for STDs.  Patient is a 24 y.o. female presenting with abdominal pain and back pain. The history is provided by the patient.  Abdominal Pain Associated symptoms: chest pain, nausea, shortness of breath and vaginal discharge   Associated symptoms: no cough, no dysuria, no fever, no sore throat, no vaginal bleeding and no vomiting   Back Pain Associated symptoms: abdominal pain, chest pain and pelvic pain   Associated symptoms: no dysuria and no fever     Past Medical History  Diagnosis Date  . Pregnant   . Abnormal Pap  smear    Past Surgical History  Procedure Laterality Date  . Cesarean section  01/02/2012    NRFHT, Abruption=5%  . Cesarean section  01/02/2012    Procedure: CESAREAN SECTION;  Surgeon: Tilda BurrowJohn V Ferguson, MD;  Location: [facility];  Service: Gynecology;  Laterality: N/A;  Primary cesarean section of baby boy ay 0125 APGAR 8/9   Family History  Problem Relation Age of Onset  . Anesthesia problems Neg Hx   . Other Neg Hx    History  Substance Use Topics  . Smoking status: Current Every Day Smoker -- 0.25 packs/day    Types: Cigarettes    Last Attempt to Quit: 05/12/2011  . Smokeless tobacco: Never Used  . Alcohol Use: No   OB History    Gravida Para Term Preterm AB TAB SAB Ectopic Multiple Living   5 4 4  0 0 0 0 0 0 4     Review of Systems  Constitutional: Negative for fever and diaphoresis.  HENT: Negative for sore throat.   Eyes: Negative for discharge and redness.  Respiratory: Positive for shortness of breath. Negative for cough.   Cardiovascular: Positive for chest pain. Negative for palpitations and leg swelling.  Gastrointestinal: Positive for nausea and abdominal pain. Negative for vomiting and rectal pain.  Genitourinary: Positive for vaginal discharge and pelvic pain. Negative for dysuria, frequency, vaginal bleeding and genital sores.  Musculoskeletal: Positive for back pain. Negative for arthralgias and neck pain.  Skin: Negative for rash.  Neurological: Negative for syncope and light-headedness.  Hematological: Negative for adenopathy.   Allergies  Review  of patient's allergies indicates no known allergies.  Home Medications   Prior to Admission medications   Medication Sig Start Date End Date Taking? Authorizing Provider  metroNIDAZOLE (FLAGYL) 500 MG tablet Take 1 tablet (500 mg total) by mouth 2 (two) times daily. 10/05/13   Jean Rosenthal, NP   BP 91/60 mmHg  Pulse 60  Temp(Src) 98.4 F (36.9 C) (Oral)  Resp 18  SpO2 100%  LMP 06/02/2014   Physical  Exam  Constitutional: She appears well-developed and well-nourished.  HENT:  Head: Normocephalic and atraumatic.  Eyes: Conjunctivae are normal. Right eye exhibits no discharge. Left eye exhibits no discharge.  Neck: Normal range of motion. Neck supple.  Cardiovascular: Normal rate, regular rhythm and normal heart sounds.   No murmur heard. Pulmonary/Chest: Effort normal and breath sounds normal. No respiratory distress. She has no wheezes. She has no rales. She exhibits no tenderness.  Abdominal: Soft. There is no tenderness. There is no rebound and no guarding.  Genitourinary: Uterus normal. There is no rash or tenderness on the right labia. There is no rash or tenderness on the left labia. Uterus is not tender. Cervix exhibits motion tenderness (mild). Cervix exhibits no discharge and no friability. Right adnexum displays no mass and no tenderness. Left adnexum displays no mass and no tenderness. No signs of injury around the vagina. Vaginal discharge (yellow green) found.  Neurological: She is alert.  Skin: Skin is warm and dry.  Psychiatric: She has a normal mood and affect.  Nursing note and vitals reviewed.   ED Course  Procedures (including critical care time) Labs Review Labs Reviewed  WET PREP, GENITAL - Abnormal; Notable for the following:    Trich, Wet Prep MODERATE (*)    Clue Cells Wet Prep HPF POC MODERATE (*)    WBC, Wet Prep HPF POC MANY (*)    All other components within normal limits  URINALYSIS, ROUTINE W REFLEX MICROSCOPIC - Abnormal; Notable for the following:    Leukocytes, UA SMALL (*)    All other components within normal limits  URINE MICROSCOPIC-ADD ON - Abnormal; Notable for the following:    Squamous Epithelial / LPF MANY (*)    Bacteria, UA FEW (*)    All other components within normal limits  HIV ANTIBODY (ROUTINE TESTING)  RPR  POC URINE PREG, ED  GC/CHLAMYDIA PROBE AMP ()    Imaging Review Dg Chest 2 View  07/08/2014   CLINICAL  DATA:  Upper chest pain and back pain.  Abdominal pain.  EXAM: CHEST  2 VIEW  COMPARISON:  None.  FINDINGS: The heart size and mediastinal contours are within normal limits. Both lungs are clear. The visualized skeletal structures are unremarkable.  IMPRESSION: No active cardiopulmonary disease.   Electronically Signed   By: Elige Ko   On: 07/08/2014 13:39     EKG Interpretation   Date/Time:  Tuesday July 08 2014 17:37:21 EST Ventricular Rate:  62 PR Interval:  163 QRS Duration: 85 QT Interval:  401 QTC Calculation: 407 R Axis:   83 Text Interpretation:  NSR Atrial premature complex Confirmed by Lincoln Brigham  [id number]) on 07/08/2014 6:03:13 PM       4:55 PM Patient seen and examined. Work-up initiated. Medications ordered. Will perform pelvic exam. Will test and treat for STD. Pending EKG.  Vital signs reviewed and are as follows: BP 91/60 mmHg  Pulse 60  Temp(Src) 98.4 F (36.9 C) (Oral)  Resp 18  SpO2 100%  LMP 06/02/2014  6:49 PM Pelvic performed with nurse chaperone, Irving Burton. Patient updated on results including positive trichomonas test. Patient treated in emergency department with Rocephin and azithromycin. She'll be treated for trichomoniasis with one week of Flagyl. Given patient's mild cervical motion tenderness, will give PID course of doxycycline to take for 14 days.  Urged primary care follow-up.  The patient was urged to return to the Emergency Department immediately with worsening of current symptoms, worsening abdominal pain, persistent vomiting, blood noted in stools, fever, or any other concerns. The patient verbalized understanding.   BP 94/53 mmHg  Pulse 78  Temp(Src) 98.4 F (36.9 C) (Oral)  Resp 19  SpO2 100%  LMP 06/02/2014   Patient counseled on safe sexual practices. Told them that they should not have sexual contact for next 7 days and that they need to inform sexual partners so that they can get tested and treated as well. Patient verbalizes  understanding and agrees with plan.      MDM   Final diagnoses:  Trichomoniasis  Vaginal discharge  Atypical chest pain   Atypical chest pains: These are short-lived. Patient is PERC negative and I do not suspect pulmonary embolism. I do not suspect ACS. Chest x-ray is negative. EKG is unremarkable. Symptoms are not exertional. Symptomatic treatment indicated with PCP follow-up.  Vaginal discharge/trichomoniasis: Patient treated for gonorrhea and chlamydia in emergency department. She is discharged home with medication for trichomoniasis. She has RPR and HIV pending. Do not suspect tubo-ovarian abscess or any surgical indications. Patient appears well, nontoxic. No systemic symptoms of illness.   Renne Crigler, PA-C 07/08/14 1852  Tilden Fossa, MD 07/08/14 Nicholos Johns

## 2014-07-08 NOTE — Discharge Instructions (Signed)
Please read and follow all provided instructions.  Your diagnoses today include:  1. Trichomoniasis   2. Vaginal discharge   3. Atypical chest pain    Tests performed today include:  Chest x-ray - clear  Wet prep - shows trichomonas  Urine test to look for infection and pregnancy (in women) - no pregnancy  Tests for gonorrhea, chlamydia,   Vital signs. See below for your results today.   Medications prescribed:   Doxycycline - antibiotic  You have been prescribed an antibiotic medicine: take the entire course of medicine even if you are feeling better. Stopping early can cause the antibiotic not to work.   Metronidazole - antibiotic  You have been prescribed an antibiotic medicine: take the entire course of medicine even if you are feeling better. Stopping early can cause the antibiotic not to work. Do not drink alcohol when taking this medication.   Take any prescribed medications only as directed.  Home care instructions:   Follow any educational materials contained in this packet.   You should tell your partners about your infection and avoid having sex for one week to allow time for the medicine to work.  Follow-up instructions: You should follow-up with the [facility] STD clinic to be tested for HIV, syphilis, and hepatitis -- all of which can be transmitted by sexual contact. We do not routinely screen for these in the Emergency Department.  STD Testing:  [facility] Department of [facility], [facility]  [address] [place], phone [phone] or [phone]    Monday - Friday, call for an appointment  [facility] Department of [facility], [facility]  [address], [place], phone [phone] or [phone]   Monday - Friday, call for an appointment  Follow-up instructions: Please follow-up with your primary care provider in the next 3 days for further evaluation of your symptoms.    Return  instructions:  SEEK IMMEDIATE MEDICAL ATTENTION IF:  The pain does not go away or becomes severe   A temperature above 101F develops   Repeated vomiting occurs (multiple episodes)   The pain becomes localized to portions of the abdomen. The right side could possibly be appendicitis. In an adult, the left lower portion of the abdomen could be colitis or diverticulitis.   Blood is being passed in stools or vomit (bright red or black tarry stools)   You develop chest pain, difficulty breathing, dizziness or fainting, or become confused, poorly responsive, or inconsolable (young children)  If you have any other emergent concerns regarding your health  Additional Information: Abdominal (belly) pain can be caused by many things. Your caregiver performed an examination and possibly ordered blood/urine tests and imaging (CT scan, x-rays, ultrasound). Many cases can be observed and treated at home after initial evaluation in the emergency department. Even though you are being discharged home, abdominal pain can be unpredictable. Therefore, you need a repeated exam if your pain does not resolve, returns, or worsens. Most patients with abdominal pain don't have to be admitted to the hospital or have surgery, but serious problems like appendicitis and gallbladder attacks can start out as nonspecific pain. Many abdominal conditions cannot be diagnosed in one visit, so follow-up evaluations are very important.  Your vital signs today were: BP 120/57 mmHg   Pulse 75   Temp(Src) 98.4 F (36.9 C) (Oral)   Resp 16   SpO2 98%   LMP 06/02/2014 If your blood pressure (bp) was elevated above 135/85  this visit, please have this repeated by your doctor within one month. --------------

## 2014-07-08 NOTE — ED Notes (Signed)
Pt comfortable with discharge and follow up instructions. Pt declines wheelchair, escorted to waiting area by this RN. Prescriptions x2. 

## 2014-07-08 NOTE — ED Notes (Signed)
Pt c/o lower abd pain into back and some pain with cough x weeks; pt sts LMP was 12/21; pt sts she may have been exposed to HIV

## 2014-07-09 LAB — GC/CHLAMYDIA PROBE AMP (~~LOC~~) NOT AT ARMC
CHLAMYDIA, DNA PROBE: POSITIVE — AB
Neisseria Gonorrhea: NEGATIVE

## 2014-07-11 LAB — RPR: RPR: NONREACTIVE

## 2014-07-11 LAB — HIV ANTIBODY (ROUTINE TESTING W REFLEX): HIV Screen 4th Generation wRfx: NONREACTIVE

## 2014-07-13 ENCOUNTER — Inpatient Hospital Stay (HOSPITAL_COMMUNITY)
Admission: AD | Admit: 2014-07-13 | Discharge: 2014-07-13 | Disposition: A | Discharge status: Home / Self Care | Payer: Medicaid Other | Source: Ambulatory Visit | Attending: Obstetrics & Gynecology | Admitting: Obstetrics & Gynecology

## 2014-07-13 ENCOUNTER — Telehealth (HOSPITAL_BASED_OUTPATIENT_CLINIC_OR_DEPARTMENT_OTHER): Payer: Self-pay | Admitting: Emergency Medicine

## 2014-07-13 ENCOUNTER — Encounter (HOSPITAL_COMMUNITY): Payer: Self-pay

## 2014-07-13 DIAGNOSIS — Z3202 Encounter for pregnancy test, result negative: Secondary | ICD-10-CM

## 2014-07-13 DIAGNOSIS — N912 Amenorrhea, unspecified: Secondary | ICD-10-CM | POA: Insufficient documentation

## 2014-07-13 DIAGNOSIS — N926 Irregular menstruation, unspecified: Secondary | ICD-10-CM

## 2014-07-13 DIAGNOSIS — M549 Dorsalgia, unspecified: Secondary | ICD-10-CM | POA: Diagnosis present

## 2014-07-13 DIAGNOSIS — F17210 Nicotine dependence, cigarettes, uncomplicated: Secondary | ICD-10-CM | POA: Diagnosis not present

## 2014-07-13 DIAGNOSIS — A5901 Trichomonal vulvovaginitis: Secondary | ICD-10-CM

## 2014-07-13 DIAGNOSIS — A749 Chlamydial infection, unspecified: Secondary | ICD-10-CM

## 2014-07-13 LAB — URINALYSIS, ROUTINE W REFLEX MICROSCOPIC
Bilirubin Urine: NEGATIVE
Glucose, UA: NEGATIVE mg/dL
Hgb urine dipstick: NEGATIVE
Ketones, ur: NEGATIVE mg/dL
Leukocytes, UA: NEGATIVE
NITRITE: NEGATIVE
PH: 6.5 (ref 5.0–8.0)
Protein, ur: NEGATIVE mg/dL
SPECIFIC GRAVITY, URINE: 1.015 (ref 1.005–1.030)
Urobilinogen, UA: 0.2 mg/dL (ref 0.0–1.0)

## 2014-07-13 LAB — POCT PREGNANCY, URINE: PREG TEST UR: NEGATIVE

## 2014-07-13 NOTE — MAU Provider Note (Signed)
History     CSN: [id number]  Arrival date and time: 07/13/14 1047   First Provider Initiated Contact with Patient 07/13/14 1132      Chief Complaint  Patient presents with  . Back Pain   HPI Monique Harris 24 y.o. Comes to MAU as she has not had her period this month and that has never happened to her before.  Advised her pregnancy test is negative today.  Her pregnancy test was negative several days ago when she was evaluated in the ER.  Previous notes and lab test reviewed.  OB History    Gravida Para Term Preterm AB TAB SAB Ectopic Multiple Living   5 4 4  0 0 0 0 0 0 4      Past Medical History  Diagnosis Date  . Pregnant   . Abnormal Pap smear     Past Surgical History  Procedure Laterality Date  . Cesarean section  01/02/2012    NRFHT, Abruption=5%  . Cesarean section  01/02/2012    Procedure: CESAREAN SECTION;  Surgeon: Tilda BurrowJohn V Ferguson, MD;  Location: [facility];  Service: Gynecology;  Laterality: N/A;  Primary cesarean section of baby boy ay 0125 APGAR 8/9    Family History  Problem Relation Age of Onset  . Anesthesia problems Neg Hx   . Other Neg Hx     History  Substance Use Topics  . Smoking status: Current Every Day Smoker -- 0.25 packs/day    Types: Cigarettes    Last Attempt to Quit: 05/12/2011  . Smokeless tobacco: Never Used  . Alcohol Use: No    Allergies: No Known Allergies  Prescriptions prior to admission  Medication Sig Dispense Refill Last Dose  . doxycycline (VIBRAMYCIN) 100 MG capsule Take 1 capsule (100 mg total) by mouth 2 (two) times daily. Take for a total of 14 days. (Patient not taking: Reported on 07/13/2014) 28 capsule 0   . metroNIDAZOLE (FLAGYL) 500 MG tablet Take 1 tablet (500 mg total) by mouth 2 (two) times daily. (Patient not taking: Reported on 07/13/2014) 14 tablet 0     Review of Systems  Constitutional: Negative for fever.  Gastrointestinal: Negative for nausea, vomiting and abdominal pain.  Genitourinary:       No  vaginal discharge. No vaginal bleeding. No dysuria.   Physical Exam   Blood pressure 114/60, pulse 86, resp. rate 18, last menstrual period 06/02/2014.  Physical Exam  Nursing note and vitals reviewed. Constitutional: She is oriented to person, place, and time. She appears well-developed and well-nourished. No distress.  HENT:  Head: Normocephalic.  Eyes: EOM are normal.  Neck: Neck supple.  Musculoskeletal: Normal range of motion.  Neurological: She is alert and oriented to person, place, and time.  Skin: Skin is warm and dry.  Psychiatric: She has a normal mood and affect.    MAU Course  Procedures  MDM Client wanted to know why she was not having her period.  She did not report any problems to the provider.  She did not mention any problems with back pain.  Wanted a blood test as she thought she might be early pregnant.  Does not have any other physical complaints today.  Advised that women sometimes skip a period.  Advised at this time to follow:  No smoking, no alcohol, no drugs and repeat the pregnancy test in 2 weeks.  Advised her chlamydia was positive but she had Zithromax 1000 mg PO in ER so she did already receive treatment.  Advised her to notify her partner that he will need treatment for trichomonas and chlamydia.  And she needs to complete the Metronidazole or Flagyl to treat the trichomonas.  Assessment and Plan  Negative pregnancy test Missed menses  Plan Follow up with your medical provider for a repeat pregnancy test in 2 weeks if no menses. No smoking, no alcohol, no drugs. Finish taking the Metronidazole or Flagyl as directed so the trichomonas can be treated. Advise your partner to get treatment for trichomonas and chlamydia. No sex until 10 days after you have completed your medication and no sex until 10 days after your partner has completed treatment.  After note completed and discussed findings with the patient, she came to the desk and complained that  her results were given to her while her friend and her young children were in the room.  The nurse reviewed with her that the visitor policy had been reviewed during triage and she elected to have her children and a visitor present during her visit to MAU.  BURLESON,TERRI 07/13/2014, 11:37 AM

## 2014-07-13 NOTE — MAU Note (Signed)
Pt presents to MAU with complaints of having back pain and her menstrual cycle is 10 days late

## 2014-07-13 NOTE — Discharge Instructions (Signed)
Follow up with your medical provider for a repeat pregnancy test in 2 weeks if no menses. No smoking, no alcohol, no drugs. Finish taking the Metronidazole or Flagyl as directed so the trichomonas can be treated. Advise your partner to get treatment for trichomonas and chlamydia. No sex until 10 days after you have completed your medication and no sex until 10 days after your partner has completed treatment.

## 2014-07-13 NOTE — Telephone Encounter (Signed)
Positive Chlamydia culture Treated with Rocephin and Zithromax per protocol MD [facility] faxed  ID verified, pt notified of positive Chlamydia and that treatment was given with Rocephin and Zithromax while in ED. STD instructions given, patient verbalized understanding.

## 2016-03-24 ENCOUNTER — Encounter (HOSPITAL_COMMUNITY): Payer: Self-pay | Admitting: *Deleted

## 2016-03-24 ENCOUNTER — Inpatient Hospital Stay (HOSPITAL_COMMUNITY)
Admission: AD | Admit: 2016-03-24 | Discharge: 2016-03-24 | Disposition: A | Discharge status: Home / Self Care | Payer: Medicaid Other | Source: Ambulatory Visit | Attending: Obstetrics & Gynecology | Admitting: Obstetrics & Gynecology

## 2016-03-24 ENCOUNTER — Inpatient Hospital Stay (HOSPITAL_COMMUNITY): Payer: Medicaid Other

## 2016-03-24 DIAGNOSIS — O99331 Smoking (tobacco) complicating pregnancy, first trimester: Secondary | ICD-10-CM | POA: Diagnosis not present

## 2016-03-24 DIAGNOSIS — F17210 Nicotine dependence, cigarettes, uncomplicated: Secondary | ICD-10-CM | POA: Insufficient documentation

## 2016-03-24 DIAGNOSIS — R109 Unspecified abdominal pain: Secondary | ICD-10-CM | POA: Diagnosis present

## 2016-03-24 DIAGNOSIS — R102 Pelvic and perineal pain: Secondary | ICD-10-CM

## 2016-03-24 DIAGNOSIS — O26891 Other specified pregnancy related conditions, first trimester: Secondary | ICD-10-CM | POA: Diagnosis not present

## 2016-03-24 DIAGNOSIS — Z3A01 Less than 8 weeks gestation of pregnancy: Secondary | ICD-10-CM | POA: Diagnosis not present

## 2016-03-24 DIAGNOSIS — O3680X0 Pregnancy with inconclusive fetal viability, not applicable or unspecified: Secondary | ICD-10-CM

## 2016-03-24 LAB — CBC
HEMATOCRIT: 37.1 % (ref 36.0–46.0)
HEMOGLOBIN: 12.9 g/dL (ref 12.0–15.0)
MCH: 28.7 pg (ref 26.0–34.0)
MCHC: 34.8 g/dL (ref 30.0–36.0)
MCV: 82.4 fL (ref 78.0–100.0)
Platelets: 327 10*3/uL (ref 150–400)
RBC: 4.5 MIL/uL (ref 3.87–5.11)
RDW: 13.5 % (ref 11.5–15.5)
WBC: 12.4 10*3/uL — AB (ref 4.0–10.5)

## 2016-03-24 LAB — URINALYSIS, ROUTINE W REFLEX MICROSCOPIC
Bilirubin Urine: NEGATIVE
Glucose, UA: NEGATIVE mg/dL
Hgb urine dipstick: NEGATIVE
Ketones, ur: 15 mg/dL — AB
NITRITE: POSITIVE — AB
PROTEIN: 30 mg/dL — AB
SPECIFIC GRAVITY, URINE: 1.02 (ref 1.005–1.030)
pH: 8 (ref 5.0–8.0)

## 2016-03-24 LAB — WET PREP, GENITAL
Clue Cells Wet Prep HPF POC: NONE SEEN
SPERM: NONE SEEN
TRICH WET PREP: NONE SEEN
WBC WET PREP: NONE SEEN
Yeast Wet Prep HPF POC: NONE SEEN

## 2016-03-24 LAB — HCG, QUANTITATIVE, PREGNANCY: HCG, BETA CHAIN, QUANT, S: 154 m[IU]/mL — AB (ref <5)

## 2016-03-24 LAB — URINE MICROSCOPIC-ADD ON

## 2016-03-24 LAB — POCT PREGNANCY, URINE: PREG TEST UR: POSITIVE — AB

## 2016-03-24 NOTE — Discharge Instructions (Signed)

## 2016-03-24 NOTE — MAU Note (Signed)
Pt states she is having abdominal pain that has been going on for two weeks.  Pt states it is a lower abdominal cramping pain that sometimes makes her feel pressure in her butt.  Pt states she is having tooth pain that has been going on for two weeks.  Pt states she has been vomiting for two weeks but has only had it happen three times.  Pt states she the headache has been on and off for two weeks.

## 2016-03-24 NOTE — MAU Provider Note (Signed)
Chief Complaint: Abdominal Pain; Dental Pain; Headache; and Emesis   First Provider Initiated Contact with Patient 03/24/16 1623      SUBJECTIVE HPI: Monique Harris is a 25 y.o. [phone] at [redacted]w[redacted]d by LMP who presents to maternity admissions reporting pain in an upper right tooth, nausea with occasional vomiting, and abdominal cramping, all symptoms starting 2 weeks ago. She reports she has had similar symptoms with pregnancy in the past. She has not tried any treatments and her symptoms are intermittent and unchanged since onset. She reports vomiting x 3 in 2 weeks with daily intermittent nausea.  Patient's last menstrual period was 02/23/2016.  She reports her periods are usually regular. She denies vaginal bleeding, vaginal itching/burning, urinary symptoms, h/a, dizziness, or fever/chills.     HPI  Past Medical History:  Diagnosis Date  . Abnormal Pap smear   . Pregnant    Past Surgical History:  Procedure Laterality Date  . CESAREAN SECTION  01/02/2012   NRFHT, Abruption=5%  . CESAREAN SECTION  01/02/2012   Procedure: CESAREAN SECTION;  Surgeon: Tilda Burrow, MD;  Location: [facility];  Service: Gynecology;  Laterality: N/A;  Primary cesarean section of baby boy ay 0125 APGAR 8/9   Social History   Social History  . Marital status: Single    Spouse name: N/A  . Number of children: N/A  . Years of education: N/A   Occupational History  . Not on file.   Social History Main Topics  . Smoking status: Current Every Day Smoker    Packs/day: 0.25    Types: Cigarettes    Last attempt to quit: 05/12/2011  . Smokeless tobacco: Never Used  . Alcohol use No  . Drug use: No  . Sexual activity: Yes    Birth control/ protection: None   Other Topics Concern  . Not on file   Social History Narrative  . No narrative on file   No current facility-administered medications on file prior to encounter.    No current outpatient prescriptions on file prior to encounter.   No Known  Allergies  ROS:  Review of Systems  Constitutional: Negative for chills, fatigue and fever.  HENT:       Right upper tooth pain  Respiratory: Negative for shortness of breath.   Cardiovascular: Negative for chest pain.  Gastrointestinal: Positive for nausea and vomiting.  Genitourinary: Positive for pelvic pain. Negative for difficulty urinating, dysuria, flank pain, vaginal bleeding, vaginal discharge and vaginal pain.  Neurological: Negative for dizziness and headaches.  Psychiatric/Behavioral: Negative.      I have reviewed patient's Past Medical Hx, Surgical Hx, Family Hx, Social Hx, medications and allergies.   Physical Exam   Patient Vitals for the past 24 hrs:  BP Temp Temp src Pulse Resp SpO2  03/24/16 1751 118/77 - - 114 18 -  03/24/16 1552 129/70 98.6 F (37 C) Oral (!) 129 18 100 %   Constitutional: Well-developed, well-nourished female in no acute distress.  Cardiovascular: normal rate Respiratory: normal effort GI: Abd soft, non-tender. Pos BS x 4 MS: Extremities nontender, no edema, normal ROM Neurologic: Alert and oriented x 4.  GU: Neg CVAT.  PELVIC EXAM: wet prep/GCC collected by blind swab   LAB RESULTS Results for orders placed or performed during the hospital encounter of 03/24/16 (from the past 24 hour(s))  Urinalysis, Routine w reflex microscopic (not at [facility])     Status: Abnormal   Collection Time: 03/24/16  3:45 PM  Result Value Ref Range  Color, Urine YELLOW YELLOW   APPearance CLEAR CLEAR   Specific Gravity, Urine 1.020 1.005 - 1.030   pH 8.0 5.0 - 8.0   Glucose, UA NEGATIVE NEGATIVE mg/dL   Hgb urine dipstick NEGATIVE NEGATIVE   Bilirubin Urine NEGATIVE NEGATIVE   Ketones, ur 15 (A) NEGATIVE mg/dL   Protein, ur 30 (A) NEGATIVE mg/dL   Nitrite POSITIVE (A) NEGATIVE   Leukocytes, UA SMALL (A) NEGATIVE  Urine microscopic-add on     Status: Abnormal   Collection Time: 03/24/16  3:45 PM  Result Value Ref Range   Squamous Epithelial / LPF  0-5 (A) NONE SEEN   WBC, UA 0-5 0 - 5 WBC/hpf   RBC / HPF 0-5 0 - 5 RBC/hpf   Bacteria, UA RARE (A) NONE SEEN  Pregnancy, urine POC     Status: Abnormal   Collection Time: 03/24/16  4:01 PM  Result Value Ref Range   Preg Test, Ur POSITIVE (A) NEGATIVE  Wet prep, genital     Status: None   Collection Time: 03/24/16  4:35 PM  Result Value Ref Range   Yeast Wet Prep HPF POC NONE SEEN NONE SEEN   Trich, Wet Prep NONE SEEN NONE SEEN   Clue Cells Wet Prep HPF POC NONE SEEN NONE SEEN   WBC, Wet Prep HPF POC NONE SEEN NONE SEEN   Sperm NONE SEEN   CBC     Status: Abnormal   Collection Time: 03/24/16  4:38 PM  Result Value Ref Range   WBC 12.4 (H) 4.0 - 10.5 K/uL   RBC 4.50 3.87 - 5.11 MIL/uL   Hemoglobin 12.9 12.0 - 15.0 g/dL   HCT 16.137.1 09.636.0 - 04.546.0 %   MCV 82.4 78.0 - 100.0 fL   MCH 28.7 26.0 - 34.0 pg   MCHC 34.8 30.0 - 36.0 g/dL   RDW 40.913.5 81.111.5 - 91.415.5 %   Platelets 327 150 - 400 K/uL  hCG, quantitative, pregnancy     Status: Abnormal   Collection Time: 03/24/16  4:38 PM  Result Value Ref Range   hCG, Beta Chain, Quant, S 154 (H) <5 mIU/mL    [place] Ob Comp Less 14 Wks  Result Date: 03/24/2016 CLINICAL DATA:  Right lower quadrant pain for 2 weeks EXAM: OBSTETRIC <14 WK US AND TRANSVAGINAL OB US TECHNIQUE: Both transabdominal and transvaginal ultrasound examinations were performed for complete evaluation of the gestation as well as the maternal uterus, adnexal regions, and pelvic cul-de-sac. Transvaginal technique was performed to assess early pregnancy. COMPARISON:  06/15/2012 FINDINGS: Intrauterine gestational sac: Nonvisualized Yolk sac:  Nonvisualized Embryo:  Nonvisualized Cardiac Activity: Nonvisualized Subchorionic hemorrhage:  None visualized. Maternal uterus/adnexae: Uterus is unremarkable and without focal mass lesions. The endometrial stripe is thickened but no definite gestational sac is visualized. The ovaries are normal in size and configuration with normal follicles. Left  ovary measures 3.5 x 2 x 2.3 cm, right ovary measures 1.6 x 2.5 x 1.8 cm. No gross adnexal masses. No free fluid. IMPRESSION: 1. Thickened endometrial stripe but no intrauterine gestational sac or fetal pole identified. Given quantitative beta HCG value, findings could relate to very early pregnancy, recommend clinical correlation with serial beta HCG and sonographic follow-up to document intrauterine pregnancy. 2. No gross adnexal masses are visualized.  No free pelvic fluid. Electronically Signed   By: Jasmine PangKim  Fujinaga M.D.   On: 03/24/2016 17:40   [place] Ob Transvaginal  Result Date: 03/24/2016 CLINICAL DATA:  Right lower quadrant pain for 2 weeks EXAM:  OBSTETRIC <14 WK [place] AND TRANSVAGINAL OB US TECHNIQUE: Both transabdominal and transvaginal ultrasound examinations were performed for complete evaluation of the gestation as well as the maternal uterus, adnexal regions, and pelvic cul-de-sac. Transvaginal technique was performed to assess early pregnancy. COMPARISON:  06/15/2012 FINDINGS: Intrauterine gestational sac: Nonvisualized Yolk sac:  Nonvisualized Embryo:  Nonvisualized Cardiac Activity: Nonvisualized Subchorionic hemorrhage:  None visualized. Maternal uterus/adnexae: Uterus is unremarkable and without focal mass lesions. The endometrial stripe is thickened but no definite gestational sac is visualized. The ovaries are normal in size and configuration with normal follicles. Left ovary measures 3.5 x 2 x 2.3 cm, right ovary measures 1.6 x 2.5 x 1.8 cm. No gross adnexal masses. No free fluid. IMPRESSION: 1. Thickened endometrial stripe but no intrauterine gestational sac or fetal pole identified. Given quantitative beta HCG value, findings could relate to very early pregnancy, recommend clinical correlation with serial beta HCG and sonographic follow-up to document intrauterine pregnancy. 2. No gross adnexal masses are visualized.  No free pelvic fluid. Electronically Signed   By: Jasmine Pang M.D.   On:  03/24/2016 17:40      OB US pending. Report to Venia Carbon, NP    Sharen Counter Certified Nurse-Midwife 03/24/2016  5:34 PM    A:  1. Pregnancy of unknown anatomic location   2. Pelvic pain affecting pregnancy in first trimester, antepartum     P:  Discharge home in stable condition Strict ectopic precautions Return on Saturday 10/14 for beta hcg level. Or sooner if symptoms worsen Pelvic rest Support given  Duane Lope, NP 03/24/2016 5:54 PM

## 2016-03-25 LAB — GC/CHLAMYDIA PROBE AMP (~~LOC~~) NOT AT ARMC
Chlamydia: NEGATIVE
Neisseria Gonorrhea: NEGATIVE

## 2016-03-25 LAB — HIV ANTIBODY (ROUTINE TESTING W REFLEX): HIV Screen 4th Generation wRfx: NONREACTIVE

## 2016-03-26 ENCOUNTER — Inpatient Hospital Stay (HOSPITAL_COMMUNITY)
Admission: AD | Admit: 2016-03-26 | Discharge: 2016-03-26 | Disposition: A | Discharge status: Home / Self Care | Payer: Medicaid Other | Source: Ambulatory Visit | Attending: Obstetrics and Gynecology | Admitting: Obstetrics and Gynecology

## 2016-03-26 DIAGNOSIS — O3680X0 Pregnancy with inconclusive fetal viability, not applicable or unspecified: Secondary | ICD-10-CM

## 2016-03-26 DIAGNOSIS — Z3A01 Less than 8 weeks gestation of pregnancy: Secondary | ICD-10-CM

## 2016-03-26 DIAGNOSIS — O26893 Other specified pregnancy related conditions, third trimester: Secondary | ICD-10-CM | POA: Diagnosis not present

## 2016-03-26 DIAGNOSIS — R109 Unspecified abdominal pain: Secondary | ICD-10-CM

## 2016-03-26 DIAGNOSIS — O26891 Other specified pregnancy related conditions, first trimester: Secondary | ICD-10-CM | POA: Diagnosis not present

## 2016-03-26 DIAGNOSIS — O26899 Other specified pregnancy related conditions, unspecified trimester: Secondary | ICD-10-CM

## 2016-03-26 LAB — HCG, QUANTITATIVE, PREGNANCY: hCG, Beta Chain, Quant, S: 391 m[IU]/mL — ABNORMAL HIGH (ref <5)

## 2016-03-26 NOTE — Discharge Instructions (Signed)

## 2016-03-26 NOTE — MAU Note (Signed)
Doing fine.  Keeps having little sharp in abd. No bleeding.

## 2016-03-26 NOTE — MAU Provider Note (Signed)
History    First Provider Initiated Contact with Patient 03/26/16 1438      Chief Complaint:  Abd pain pregnancy  ISMAEL TREPTOW is  25 y.o. [id number] Patient's last menstrual period was 02/23/2016.Marland Kitchen Patient is here for follow up of quantitative HCG and ongoing surveillance of pregnancy status.   She is [redacted]w[redacted]d weeks gestation  by LMP.    Since her last visit, the patient is without new complaint.     ROS Abdomin Pain: occasional Vaginal bleeding: none now.   Passage of clots or tissue: None Dizziness: None  Her previous Quantitative HCG values are:   03/24/16:   154   Physical Exam   BP 115/66 (BP Location: Right Arm)   Pulse 93   Temp 98.5 F (36.9 C) (Oral)   Resp 16   LMP 02/23/2016  Constitutional: Well-nourished female in no apparent distress. No pallor Neuro: Alert and oriented 4 Cardiovascular: Normal rate Respiratory: Normal effort and rate Abdomen: Soft, nontender Gynecological Exam: examination not indicated  Labs: Results for orders placed or performed during the hospital encounter of 03/26/16 (from the past 24 hour(s))  hCG, quantitative, pregnancy   Collection Time: 03/26/16  1:05 PM  Result Value Ref Range   hCG, Beta Chain, Quant, S 391 (H) <5 mIU/mL    Ultrasound Studies:   US Ob Comp Less 14 Wks  Result Date: 03/24/2016 CLINICAL DATA:  Right lower quadrant pain for 2 weeks EXAM: OBSTETRIC <14 WK [place] AND TRANSVAGINAL OB US TECHNIQUE: Both transabdominal and transvaginal ultrasound examinations were performed for complete evaluation of the gestation as well as the maternal uterus, adnexal regions, and pelvic cul-de-sac. Transvaginal technique was performed to assess early pregnancy. COMPARISON:  06/15/2012 FINDINGS: Intrauterine gestational sac: Nonvisualized Yolk sac:  Nonvisualized Embryo:  Nonvisualized Cardiac Activity: Nonvisualized Subchorionic hemorrhage:  None visualized. Maternal uterus/adnexae: Uterus is unremarkable and without focal mass  lesions. The endometrial stripe is thickened but no definite gestational sac is visualized. The ovaries are normal in size and configuration with normal follicles. Left ovary measures 3.5 x 2 x 2.3 cm, right ovary measures 1.6 x 2.5 x 1.8 cm. No gross adnexal masses. No free fluid. IMPRESSION: 1. Thickened endometrial stripe but no intrauterine gestational sac or fetal pole identified. Given quantitative beta HCG value, findings could relate to very early pregnancy, recommend clinical correlation with serial beta HCG and sonographic follow-up to document intrauterine pregnancy. 2. No gross adnexal masses are visualized.  No free pelvic fluid. Electronically Signed   By: Jasmine Pang M.D.   On: 03/24/2016 17:40   US Ob Transvaginal  Result Date: 03/24/2016 CLINICAL DATA:  Right lower quadrant pain for 2 weeks EXAM: OBSTETRIC <14 WK [place] AND TRANSVAGINAL OB US TECHNIQUE: Both transabdominal and transvaginal ultrasound examinations were performed for complete evaluation of the gestation as well as the maternal uterus, adnexal regions, and pelvic cul-de-sac. Transvaginal technique was performed to assess early pregnancy. COMPARISON:  06/15/2012 FINDINGS: Intrauterine gestational sac: Nonvisualized Yolk sac:  Nonvisualized Embryo:  Nonvisualized Cardiac Activity: Nonvisualized Subchorionic hemorrhage:  None visualized. Maternal uterus/adnexae: Uterus is unremarkable and without focal mass lesions. The endometrial stripe is thickened but no definite gestational sac is visualized. The ovaries are normal in size and configuration with normal follicles. Left ovary measures 3.5 x 2 x 2.3 cm, right ovary measures 1.6 x 2.5 x 1.8 cm. No gross adnexal masses. No free fluid. IMPRESSION: 1. Thickened endometrial stripe but no intrauterine gestational sac or fetal pole identified. Given quantitative beta HCG  value, findings could relate to very early pregnancy, recommend clinical correlation with serial beta HCG and sonographic  follow-up to document intrauterine pregnancy. 2. No gross adnexal masses are visualized.  No free pelvic fluid. Electronically Signed   By: Jasmine PangKim  Fujinaga M.D.   On: 03/24/2016 17:40    MAU course/MDM: Quantitative hCG ordered  Pain in early pregnancy with normal rise in Quant and hemodynamically stable.   Assessment: 5259w4d weeks gestation w/ normal rise in Quant 1. Pregnancy of unknown anatomic location   2. Abdominal pain affecting pregnancy    Plan: Discharge home in stable condition. Ectopic and SAB precautions Follow-up Information    [facility] Follow up in 2 week(s).   Specialty:  Radiology Why:  Will call you to scheduled repeat ultrasound Contact information: [address] [id number] [place] [phone]       [facility] .   Why:  as needed in emergencies Contact information: [address] [id number] [place] [phone]           Medication List    TAKE these medications   acetaminophen 500 MG tablet Commonly known as:  TYLENOL Take 1,000 mg by mouth every 6 (six) hours as needed for mild pain.       Dorathy KinsmanVirginia Wilhide, CNM 03/26/2016, 2:44 PM  2/3

## 2016-03-27 LAB — CULTURE, OB URINE: Culture: >=100000 — AB

## 2016-03-28 ENCOUNTER — Telehealth (HOSPITAL_COMMUNITY): Payer: Self-pay | Admitting: Obstetrics and Gynecology

## 2016-03-28 MED ORDER — CEPHALEXIN 500 MG PO CAPS: 500.0000 mg | ORAL_CAPSULE | Freq: Four times a day (QID) | ORAL | 0 refills | Status: DC

## 2016-03-28 NOTE — Telephone Encounter (Signed)
+   urine culture; Keflex sent to the pharmacy. Invalid number.

## 2016-04-13 ENCOUNTER — Telehealth (HOSPITAL_COMMUNITY): Payer: Self-pay

## 2016-04-13 ENCOUNTER — Ambulatory Visit (HOSPITAL_COMMUNITY)
Admission: RE | Admit: 2016-04-13 | Discharge: 2016-04-13 | Disposition: A | Discharge status: Home / Self Care | Payer: Medicaid Other | Source: Ambulatory Visit | Attending: Advanced Practice Midwife | Admitting: Advanced Practice Midwife

## 2016-04-13 DIAGNOSIS — O3680X0 Pregnancy with inconclusive fetal viability, not applicable or unspecified: Secondary | ICD-10-CM

## 2016-04-13 DIAGNOSIS — Z362 Encounter for other antenatal screening follow-up: Secondary | ICD-10-CM | POA: Diagnosis present

## 2016-04-13 DIAGNOSIS — Z3A01 Less than 8 weeks gestation of pregnancy: Secondary | ICD-10-CM | POA: Diagnosis not present

## 2016-04-13 DIAGNOSIS — R109 Unspecified abdominal pain: Secondary | ICD-10-CM

## 2016-04-13 DIAGNOSIS — O26899 Other specified pregnancy related conditions, unspecified trimester: Secondary | ICD-10-CM

## 2016-05-07 LAB — OB RESULTS CONSOLE RPR: RPR: NONREACTIVE

## 2016-05-07 LAB — OB RESULTS CONSOLE RUBELLA ANTIBODY, IGM: Rubella: IMMUNE

## 2016-05-07 LAB — OB RESULTS CONSOLE HEPATITIS B SURFACE ANTIGEN: HEP B S AG: NEGATIVE

## 2016-06-02 ENCOUNTER — Encounter (HOSPITAL_COMMUNITY): Payer: Self-pay | Admitting: *Deleted

## 2016-06-02 ENCOUNTER — Inpatient Hospital Stay (HOSPITAL_COMMUNITY)
Admission: AD | Admit: 2016-06-02 | Discharge: 2016-06-02 | Disposition: A | Discharge status: Home / Self Care | Payer: Medicaid Other | Source: Ambulatory Visit | Attending: Obstetrics & Gynecology | Admitting: Obstetrics & Gynecology

## 2016-06-02 DIAGNOSIS — R1030 Lower abdominal pain, unspecified: Secondary | ICD-10-CM | POA: Diagnosis not present

## 2016-06-02 DIAGNOSIS — Z3A14 14 weeks gestation of pregnancy: Secondary | ICD-10-CM | POA: Insufficient documentation

## 2016-06-02 DIAGNOSIS — O26892 Other specified pregnancy related conditions, second trimester: Secondary | ICD-10-CM | POA: Insufficient documentation

## 2016-06-02 LAB — URINALYSIS, ROUTINE W REFLEX MICROSCOPIC
Bilirubin Urine: NEGATIVE
Glucose, UA: 50 mg/dL — AB
Hgb urine dipstick: NEGATIVE
KETONES UR: 5 mg/dL — AB
Nitrite: NEGATIVE
PH: 5 (ref 5.0–8.0)
Protein, ur: NEGATIVE mg/dL
Specific Gravity, Urine: 1.03 (ref 1.005–1.030)

## 2016-06-02 LAB — WET PREP, GENITAL
CLUE CELLS WET PREP: NONE SEEN
Sperm: NONE SEEN
TRICH WET PREP: NONE SEEN
WBC, Wet Prep HPF POC: NONE SEEN
YEAST WET PREP: NONE SEEN

## 2016-06-02 MED ORDER — CEPHALEXIN 500 MG PO CAPS: 500.0000 mg | ORAL_CAPSULE | Freq: Three times a day (TID) | ORAL | 0 refills | Status: DC

## 2016-06-02 NOTE — MAU Provider Note (Signed)
History   Patient Monique Harris is a 25 year old G7P4024 at 14 weeks and 2 days by early US here with abdominal cramping. She was seen in the MAU in October and diagnosed with a UTI, however attempts to reach her to inform her of her diagnosis and treatment were unsuccessful.   CSN: [id number]  Arrival date and time: 06/02/16 1722   None     Chief Complaint  Patient presents with  . Abdominal Pain   Abdominal Pain  This is a new problem. The current episode started in the past 7 days. The onset quality is sudden. The problem occurs intermittently. The problem has been gradually worsening. The pain is located in the suprapubic region. The quality of the pain is cramping. The abdominal pain does not radiate. Pertinent negatives include no anorexia, arthralgias, belching, constipation, diarrhea, dysuria, fever, flatus, frequency, headaches, hematochezia, hematuria, melena, myalgias, nausea, vomiting or weight loss. Nothing aggravates the pain. The pain is relieved by nothing. She has tried nothing for the symptoms. There is no history of abdominal surgery, colon cancer, Crohn's disease, gallstones, GERD, irritable bowel syndrome, pancreatitis, PUD or ulcerative colitis.    OB History    Gravida Para Term Preterm AB Living   7 4 4  0 2 4   SAB TAB Ectopic Multiple Live Births   0 2 0 0 4      Past Medical History:  Diagnosis Date  . Abnormal Pap smear   . Pregnant     Past Surgical History:  Procedure Laterality Date  . CESAREAN SECTION  01/02/2012   NRFHT, Abruption=5%  . CESAREAN SECTION  01/02/2012   Procedure: CESAREAN SECTION;  Surgeon: Tilda BurrowJohn V Ferguson, MD;  Location: [facility];  Service: Gynecology;  Laterality: N/A;  Primary cesarean section of baby boy ay 0125 APGAR 8/9  . NO PAST SURGERIES      Family History  Problem Relation Age of Onset  . Anesthesia problems Neg Hx   . Other Neg Hx     Social History  Substance Use Topics  . Smoking status: Current Every Day  Smoker    Packs/day: 0.25    Types: Cigarettes    Last attempt to quit: 05/12/2011  . Smokeless tobacco: Current User  . Alcohol use No    Allergies: No Known Allergies  Prescriptions Prior to Admission  Medication Sig Dispense Refill Last Dose  . acetaminophen (TYLENOL) 500 MG tablet Take 1,000 mg by mouth every 6 (six) hours as needed for mild pain.   03/23/2016 at Unknown time  . cephALEXin (KEFLEX) 500 MG capsule Take 1 capsule (500 mg total) by mouth 4 (four) times daily. 20 capsule 0     Review of Systems  Constitutional: Negative.  Negative for fever and weight loss.  HENT: Negative.   Eyes: Negative.   Respiratory: Negative.   Cardiovascular: Negative.   Gastrointestinal: Positive for abdominal pain. Negative for anorexia, constipation, diarrhea, flatus, hematochezia, melena, nausea and vomiting.  Genitourinary: Negative.  Negative for dysuria, frequency and hematuria.  Musculoskeletal: Negative.  Negative for arthralgias and myalgias.  Skin: Negative.   Neurological: Negative.  Negative for headaches.  Endo/Heme/Allergies: Negative.   Psychiatric/Behavioral: Negative.    Physical Exam   Blood pressure 114/62, pulse 102, temperature 98.5 F (36.9 C), temperature source Oral, resp. rate 18, height 5\' 4"  (1.626 m), weight 153 lb (69.4 kg), last menstrual period 02/23/2016.  Physical Exam  Constitutional: She appears well-developed and well-nourished.  HENT:  Head: Normocephalic.  Eyes:  Pupils are equal, round, and reactive to light.  Neck: Normal range of motion.  Respiratory: Effort normal. No respiratory distress. She has no wheezes. She has no rales. She exhibits no tenderness.  GI: Soft. Bowel sounds are normal. She exhibits no distension and no mass. There is tenderness. There is no rebound and no guarding.  Tender over suprapubic region  Genitourinary: Vaginal discharge found.  Genitourinary Comments: External labia normal, vaginal walls pink with no lesions,  milky white discharge pooling in vagina. Cervix is erythematous and friable; no CMT.  Suprapubic tenderness on palpation.    MAU Course  Procedures  MDM -UA --UC sent -wet prep -bimanual and pelvic -GC CT pending -FHR is 156 Assessment and Plan  1.  1. Lower abdominal pain   G7P4024 at 14 weeks and 2 days by US here for abdominal pain.    2. Discharge home with RX for Keflex, instructed to return to MAU if bleeding, CVA tenderness, fever, chills or increased abdominal pain. Patient verbalized understanding. She knows we will call her if the results of her GC CT test is positive.    3. Patient [facility].   Charlesetta GaribaldiKathryn Lorraine Pietro Bonura CNM 06/02/2016, 6:12 PM

## 2016-06-02 NOTE — MAU Note (Signed)
C/o abdominal cramping for past 3 days;

## 2016-06-02 NOTE — Discharge Instructions (Signed)
Pregnancy and Urinary Tract Infection °WHAT IS A URINARY TRACT INFECTION? °A urinary tract infection (UTI) is an infection of any part of the urinary tract. This includes the kidneys, the tubes that connect your kidneys to your bladder (ureters), the bladder, and the tube that carries urine out of your body (urethra). These organs make, store, and get rid of urine in the body. A UTI can be a bladder infection (cystitis) or a kidney infection (pyelonephritis). This infection may be caused by fungi, viruses, and bacteria. Bacteria are the most common cause of UTIs. °You are more likely to develop a UTI during pregnancy because: °· The physical and hormonal changes your body goes through can make it easier for bacteria to get into your urinary tract. °· Your growing baby puts pressure on your uterus and can affect urine flow. °DOES A UTI PLACE MY BABY AT RISK? °An untreated UTI during pregnancy could lead to a kidney infection, which can cause health problems that could affect your baby. Possible complications of an untreated UTI include: °· Having your baby before 37 weeks of pregnancy (premature). °· Having a baby with a low birth weight. °· Developing high blood pressure during pregnancy (preeclampsia). °WHAT ARE THE SYMPTOMS OF A UTI? °Symptoms of a UTI include: °· Fever. °· Frequent urination or passing small amounts of urine frequently. °· Needing to urinate urgently. °· Pain or a burning sensation with urination. °· Urine that smells bad or unusual. °· Cloudy urine. °· Pain in the lower abdomen or back. °· Trouble urinating. °· Blood in the urine. °· Vomiting or being less hungry than normal. °· Diarrhea or abdominal pain. °· Vaginal discharge. °WHAT ARE THE TREATMENT OPTIONS FOR A UTI DURING PREGNANCY? °Treatment for this condition may include: °· Antibiotic medicines that are safe to take during pregnancy. °· Other medicines to treat less common causes of UTI. °HOW CAN I PREVENT A UTI? °To prevent a UTI: °· Go  to the bathroom as soon as you feel the need. °· Always wipe from front to back. °· Wash your genital area with soap and warm water daily. °· Empty your bladder before and after sex. °· Wear cotton underwear. °· Limit your intake of high sugar foods or drinks, such as regular soda, juice, and sweets.. °· Drink 6-8 glasses of water daily. °· Do not wear tight-fitting pants. °· Do not douche or use deodorant sprays. °· Do not drink alcohol, caffeine, or carbonated drinks. These can irritate the bladder. °WHEN SHOULD I SEEK MEDICAL CARE? °Seek medical care if: °· Your symptoms do not improve or get worse. °· You have a fever after two days of treatment. °· You have a rash. °· You have abnormal vaginal discharge. °· You have back or side pain. °· You have chills. °· You have nausea and vomiting. °WHEN SHOULD I SEEK IMMEDIATE MEDICAL CARE? °Seek immediate medical care if you are pregnant and: °· You feel contractions in your uterus. °· You have lower belly pain. °· You have a gush of fluid from your vagina. °· You have blood in your urine. °· You are vomiting and cannot keep down any medicines or water. °This information is not intended to replace advice given to you by your health care provider. Make sure you discuss any questions you have with your health care provider. °Document Released: 09/24/2010 Document Revised: 11/02/2015 Document Reviewed: 04/20/2015 °Elsevier Interactive Patient Education © 2017 [employer] ° °

## 2016-06-02 NOTE — MAU Note (Signed)
Pt reports having abd pain x 3 days. Pain is in lower abd and is constant.

## 2016-06-03 LAB — GC/CHLAMYDIA PROBE AMP (~~LOC~~) NOT AT ARMC
Chlamydia: NEGATIVE
Neisseria Gonorrhea: NEGATIVE

## 2016-06-05 LAB — CULTURE, OB URINE: Culture: >=100000 — AB

## 2016-06-13 NOTE — L&D Delivery Note (Signed)
26 y.o. [id number] at 6669w1d delivered a viable female infant in cephalic, ROA position. N nuchal cord.. Anterior shoulder delivered with ease. 60 sec delayed cord clamping. Cord clamped x2 and cut. Placenta delivered spontaneously intact, with 3VC. Fundus firm on exam with massage and pitocin. Good hemostasis noted.  Anesthesia: Epidural Laceration: None Suture: N/a  Good hemostasis noted. EBL: 100 cc  Mom and baby recovering in LDR.    Apgars: APGAR (1 MIN): 8   APGAR (5 MINS): 9   APGAR (10 MINS):   Weight: Pending skin to skin  Sponge and instrument count were correct x2. Placenta sent to L&D.  Al CorpusMatthew R Mayuri Staples, MD [facility], [facility] 11/30/2016, 12:18 AM

## 2016-07-07 LAB — CYTOLOGY - PAP: PAP SMEAR: ABNORMAL — AB

## 2016-08-16 NOTE — Telephone Encounter (Signed)
See "Contacts" 

## 2016-10-11 ENCOUNTER — Encounter: Payer: Self-pay | Admitting: Obstetrics and Gynecology

## 2016-10-11 DIAGNOSIS — Z8759 Personal history of other complications of pregnancy, childbirth and the puerperium: Secondary | ICD-10-CM | POA: Insufficient documentation

## 2016-10-11 DIAGNOSIS — O283 Abnormal ultrasonic finding on antenatal screening of mother: Secondary | ICD-10-CM | POA: Insufficient documentation

## 2016-10-11 DIAGNOSIS — O34219 Maternal care for unspecified type scar from previous cesarean delivery: Secondary | ICD-10-CM | POA: Insufficient documentation

## 2016-10-11 DIAGNOSIS — O09293 Supervision of pregnancy with other poor reproductive or obstetric history, third trimester: Secondary | ICD-10-CM | POA: Insufficient documentation

## 2016-10-11 NOTE — Progress Notes (Signed)
MD Consult Note at [facility]  10/11/2016  25 y/o [id number] @ 32/6 here for MD consult for delivery planning. Preg c/b h/o pLTCS in 2013 for abruption, BMI 28, h/o shoulder dystocia, LSIL pap.   No s/s of PTL or decreased FM 2013: NRFHT and urgent C-section done (pLTCS with double layer closure) and abruption noted on placental inspection in the OR.  2011: 7lbs VD with SD noted (time not stated and pt not aware she had an SD). APGARs 8/9 and no laceration and resolved with Wood's screw maneuver per paper note. all of her other children were in the 6lbs range.  patient states both children are healthy and doing well and without any issues.  07/2016 anatomy u/s at [facility]: normal anatomy, with posterior placenta and EIF seen.    A/P:   d/w her re: r/b/a with trying for VD in setting of h/o prior C-section and SD. in the setting of prior c-section, h/o abruption, risk of uterine rupture, M-F morbidity and mortality d/w her and consent reviewed with her.   in the setting of prior SD, risk of repeat SD d/w her and risk of M-F issues related to that if unable to resolve easily. I told her that the risks of both of these are very minimal given the fact that she doesn't remember the SD happening and approx. 1% risk of uterine rupture; her FHs have been appropriate and she passed her 28wk glucola easily. After d/w her, she'd like to [facility]. She is also interested in BTL and pros/cons d/w her and will have her sign paperwork today with clinic.   If S>D, consider ultrasound for weight.   Cornelia Copa MD Attending Center for [employer] [facility])

## 2016-10-20 ENCOUNTER — Encounter (HOSPITAL_COMMUNITY): Payer: Self-pay

## 2016-11-02 LAB — OB RESULTS CONSOLE GBS: STREP GROUP B AG: POSITIVE

## 2016-11-20 ENCOUNTER — Inpatient Hospital Stay (HOSPITAL_COMMUNITY)
Admission: AD | Admit: 2016-11-20 | Discharge: 2016-11-20 | Disposition: A | Discharge status: Home / Self Care | Payer: Medicaid Other | Source: Ambulatory Visit | Attending: Obstetrics & Gynecology | Admitting: Obstetrics & Gynecology

## 2016-11-20 ENCOUNTER — Encounter (HOSPITAL_COMMUNITY): Payer: Self-pay

## 2016-11-20 DIAGNOSIS — O471 False labor at or after 37 completed weeks of gestation: Secondary | ICD-10-CM | POA: Insufficient documentation

## 2016-11-20 DIAGNOSIS — Z3A38 38 weeks gestation of pregnancy: Secondary | ICD-10-CM | POA: Diagnosis not present

## 2016-11-20 LAB — URINALYSIS, ROUTINE W REFLEX MICROSCOPIC
Bilirubin Urine: NEGATIVE
Glucose, UA: NEGATIVE mg/dL
HGB URINE DIPSTICK: NEGATIVE
Ketones, ur: 5 mg/dL — AB
Nitrite: NEGATIVE
PH: 6 (ref 5.0–8.0)
Protein, ur: 30 mg/dL — AB
SPECIFIC GRAVITY, URINE: 1.026 (ref 1.005–1.030)

## 2016-11-20 NOTE — MAU Note (Signed)
Pt is complaining of painless contractions. Said with her last pregnancy she had ctx that she wasn't feeling and was in labor. Is having vomiting at night. Not on any medication for it.

## 2016-11-20 NOTE — MAU Note (Signed)
Will recheck pt in one hour per D Hart RochesterLawson CNM. Pt will ambulate after NST.

## 2016-11-20 NOTE — MAU Note (Signed)
I have communicated with Marlynn Perking Lawson CNM and reviewed vital signs:  Vitals:   11/20/16 1411 11/20/16 1554  BP:  104/65  Pulse:  83  Resp: 16 16  Temp: 98.6 F (37 C)     Vaginal exam:  Dilation: 2 Effacement (%): 30 Cervical Position: Middle Station: -3 Presentation: Vertex Exam by:: Ginnie SmartAchel Fotios Amos RN,   Also reviewed contraction pattern and that non-stress test is reactive.  It has been documented that patient is contracting every 3-5 minutes with minimal cervical change over 1 hour not indicating active labor.  Patient denies any other complaints.  Based on this report provider has given order for discharge.  A discharge order and diagnosis entered by a provider.   Labor discharge instructions reviewed with patient.

## 2016-11-25 ENCOUNTER — Encounter (HOSPITAL_COMMUNITY): Payer: Self-pay

## 2016-11-25 ENCOUNTER — Inpatient Hospital Stay (HOSPITAL_COMMUNITY)
Admission: AD | Admit: 2016-11-25 | Discharge: 2016-11-25 | Disposition: A | Discharge status: Home / Self Care | Payer: Medicaid Other | Source: Ambulatory Visit | Attending: Obstetrics and Gynecology | Admitting: Obstetrics and Gynecology

## 2016-11-25 DIAGNOSIS — O99333 Smoking (tobacco) complicating pregnancy, third trimester: Secondary | ICD-10-CM | POA: Diagnosis not present

## 2016-11-25 DIAGNOSIS — O9989 Other specified diseases and conditions complicating pregnancy, childbirth and the puerperium: Secondary | ICD-10-CM

## 2016-11-25 DIAGNOSIS — K0889 Other specified disorders of teeth and supporting structures: Secondary | ICD-10-CM | POA: Diagnosis present

## 2016-11-25 DIAGNOSIS — Z3493 Encounter for supervision of normal pregnancy, unspecified, third trimester: Secondary | ICD-10-CM

## 2016-11-25 DIAGNOSIS — F17210 Nicotine dependence, cigarettes, uncomplicated: Secondary | ICD-10-CM | POA: Insufficient documentation

## 2016-11-25 DIAGNOSIS — Z3A39 39 weeks gestation of pregnancy: Secondary | ICD-10-CM | POA: Insufficient documentation

## 2016-11-25 DIAGNOSIS — O34211 Maternal care for low transverse scar from previous cesarean delivery: Secondary | ICD-10-CM | POA: Diagnosis not present

## 2016-11-25 DIAGNOSIS — O26893 Other specified pregnancy related conditions, third trimester: Secondary | ICD-10-CM | POA: Insufficient documentation

## 2016-11-25 LAB — URINALYSIS, ROUTINE W REFLEX MICROSCOPIC
BACTERIA UA: NONE SEEN
Bilirubin Urine: NEGATIVE
GLUCOSE, UA: NEGATIVE mg/dL
Hgb urine dipstick: NEGATIVE
Ketones, ur: NEGATIVE mg/dL
Nitrite: NEGATIVE
Protein, ur: NEGATIVE mg/dL
Specific Gravity, Urine: 1.023 (ref 1.005–1.030)
pH: 7 (ref 5.0–8.0)

## 2016-11-25 MED ORDER — OXYCODONE-ACETAMINOPHEN 5-325 MG PO TABS: 1.0000 | ORAL_TABLET | Freq: Three times a day (TID) | ORAL | 0 refills | Status: DC | PRN

## 2016-11-25 NOTE — Discharge Instructions (Signed)
Third Trimester of Pregnancy The third trimester is from week 28 through week 40 (months 7 through 9). The third trimester is a time when the unborn baby (fetus) is growing rapidly. At the end of the ninth month, the fetus is about 20 inches in length and weighs 6-10 pounds. Body changes during your third trimester Your body will continue to go through many changes during pregnancy. The changes vary from woman to woman. During the third trimester:  Your weight will continue to increase. You can expect to gain 25-35 pounds (11-16 kg) by the end of the pregnancy.  You may begin to get stretch marks on your hips, abdomen, and breasts.  You may urinate more often because the fetus is moving lower into your pelvis and pressing on your bladder.  You may develop or continue to have heartburn. This is caused by increased hormones that slow down muscles in the digestive tract.  You may develop or continue to have constipation because increased hormones slow digestion and cause the muscles that push waste through your intestines to relax.  You may develop hemorrhoids. These are swollen veins (varicose veins) in the rectum that can itch or be painful.  You may develop swollen, bulging veins (varicose veins) in your legs.  You may have increased body aches in the pelvis, back, or thighs. This is due to weight gain and increased hormones that are relaxing your joints.  You may have changes in your hair. These can include thickening of your hair, rapid growth, and changes in texture. Some women also have hair loss during or after pregnancy, or hair that feels dry or thin. Your hair will most likely return to normal after your baby is born.  Your breasts will continue to grow and they will continue to become tender. A yellow fluid (colostrum) may leak from your breasts. This is the first milk you are producing for your baby.  Your belly button may stick out.  You may notice more swelling in your hands,  face, or ankles.  You may have increased tingling or numbness in your hands, arms, and legs. The skin on your belly may also feel numb.  You may feel short of breath because of your expanding uterus.  You may have more problems sleeping. This can be caused by the size of your belly, increased need to urinate, and an increase in your body's metabolism.  You may notice the fetus "dropping," or moving lower in your abdomen (lightening).  You may have increased vaginal discharge.  You may notice your joints feel loose and you may have pain around your pelvic bone.  What to expect at prenatal visits You will have prenatal exams every 2 weeks until week 36. Then you will have weekly prenatal exams. During a routine prenatal visit:  You will be weighed to make sure you and the baby are growing normally.  Your blood pressure will be taken.  Your abdomen will be measured to track your baby's growth.  The fetal heartbeat will be listened to.  Any test results from the previous visit will be discussed.  You may have a cervical check near your due date to see if your cervix has softened or thinned (effaced).  You will be tested for Group B streptococcus. This happens between 35 and 37 weeks.  Your health care provider may ask you:  What your birth plan is.  How you are feeling.  If you are feeling the baby move.  If you have had  any abnormal symptoms, such as leaking fluid, bleeding, severe headaches, or abdominal cramping.  If you are using any tobacco products, including cigarettes, chewing tobacco, and electronic cigarettes.  If you have any questions.  Other tests or screenings that may be performed during your third trimester include:  Blood tests that check for low iron levels (anemia).  Fetal testing to check the health, activity level, and growth of the fetus. Testing is done if you have certain medical conditions or if there are problems during the  pregnancy.  Nonstress test (NST). This test checks the health of your baby to make sure there are no signs of problems, such as the baby not getting enough oxygen. During this test, a belt is placed around your belly. The baby is made to move, and its heart rate is monitored during movement.  What is false labor? False labor is a condition in which you feel small, irregular tightenings of the muscles in the womb (contractions) that usually go away with rest, changing position, or drinking water. These are called Braxton Hicks contractions. Contractions may last for hours, days, or even weeks before true labor sets in. If contractions come at regular intervals, become more frequent, increase in intensity, or become painful, you should see your health care provider. What are the signs of labor?  Abdominal cramps.  Regular contractions that start at 10 minutes apart and become stronger and more frequent with time.  Contractions that start on the top of the uterus and spread down to the lower abdomen and back.  Increased pelvic pressure and dull back pain.  A watery or bloody mucus discharge that comes from the vagina.  Leaking of amniotic fluid. This is also known as your "water breaking." It could be a slow trickle or a gush. Let your health care provider know if it has a color or strange odor. If you have any of these signs, call your health care provider right away, even if it is before your due date. Follow these instructions at home: Medicines  Follow your health care provider's instructions regarding medicine use. Specific medicines may be either safe or unsafe to take during pregnancy.  Take a prenatal vitamin that contains at least 600 micrograms (mcg) of folic acid.  If you develop constipation, try taking a stool softener if your health care provider approves. Eating and drinking  Eat a balanced diet that includes fresh fruits and vegetables, whole grains, good sources of protein  such as meat, eggs, or tofu, and low-fat dairy. Your health care provider will help you determine the amount of weight gain that is right for you.  Avoid raw meat and uncooked cheese. These carry germs that can cause birth defects in the baby.  If you have low calcium intake from food, talk to your health care provider about whether you should take a daily calcium supplement.  Eat four or five small meals rather than three large meals a day.  Limit foods that are high in fat and processed sugars, such as fried and sweet foods.  To prevent constipation: ? Drink enough fluid to keep your urine clear or pale yellow. ? Eat foods that are high in fiber, such as fresh fruits and vegetables, whole grains, and beans. Activity  Exercise only as directed by your health care provider. Most women can continue their usual exercise routine during pregnancy. Try to exercise for 30 minutes at least 5 days a week. Stop exercising if you experience uterine contractions.  Avoid heavy  lifting.  Do not exercise in extreme heat or humidity, or at high altitudes.  Wear low-heel, comfortable shoes.  Practice good posture.  You may continue to have sex unless your health care provider tells you otherwise. Relieving pain and discomfort  Take frequent breaks and rest with your legs elevated if you have leg cramps or low back pain.  Take warm sitz baths to soothe any pain or discomfort caused by hemorrhoids. Use hemorrhoid cream if your health care provider approves.  Wear a good support bra to prevent discomfort from breast tenderness.  If you develop varicose veins: ? Wear support pantyhose or compression stockings as told by your healthcare provider. ? Elevate your feet for 15 minutes, 3-4 times a day. Prenatal care  Write down your questions. Take them to your prenatal visits.  Keep all your prenatal visits as told by your health care provider. This is important. Safety  Wear your seat belt at  all times when driving.  Make a list of emergency phone numbers, including numbers for family, friends, the hospital, and police and fire departments. General instructions  Avoid cat litter boxes and soil used by cats. These carry germs that can cause birth defects in the baby. If you have a cat, ask someone to clean the litter box for you.  Do not travel far distances unless it is absolutely necessary and only with the approval of your health care provider.  Do not use hot tubs, steam rooms, or saunas.  Do not drink alcohol.  Do not use any products that contain nicotine or tobacco, such as cigarettes and e-cigarettes. If you need help quitting, ask your health care provider.  Do not use any medicinal herbs or unprescribed drugs. These chemicals affect the formation and growth of the baby.  Do not douche or use tampons or scented sanitary pads.  Do not cross your legs for long periods of time.  To prepare for the arrival of your baby: ? Take prenatal classes to understand, practice, and ask questions about labor and delivery. ? Make a trial run to the hospital. ? Visit the hospital and tour the maternity area. ? Arrange for maternity or paternity leave through employers. ? Arrange for family and friends to take care of pets while you are in the hospital. ? Purchase a rear-facing car seat and make sure you know how to install it in your car. ? Pack your hospital bag. ? Prepare the babys nursery. Make sure to remove all pillows and stuffed animals from the baby's crib to prevent suffocation.  Visit your dentist if you have not gone during your pregnancy. Use a soft toothbrush to brush your teeth and be gentle when you floss. Contact a health care provider if:  You are unsure if you are in labor or if your water has broken.  You become dizzy.  You have mild pelvic cramps, pelvic pressure, or nagging pain in your abdominal area.  You have lower back pain.  You have persistent  nausea, vomiting, or diarrhea.  You have an unusual or bad smelling vaginal discharge.  You have pain when you urinate. Get help right away if:  Your water breaks before 37 weeks.  You have regular contractions less than 5 minutes apart before 37 weeks.  You have a fever.  You are leaking fluid from your vagina.  You have spotting or bleeding from your vagina.  You have severe abdominal pain or cramping.  You have rapid weight loss or weight gain.  You have shortness of breath with chest pain.  You notice sudden or extreme swelling of your face, hands, ankles, feet, or legs.  Your baby makes fewer than 10 movements in 2 hours.  You have severe headaches that do not go away when you take medicine.  You have vision changes. Summary  The third trimester is from week 28 through week 40, months 7 through 9. The third trimester is a time when the unborn baby (fetus) is growing rapidly.  During the third trimester, your discomfort may increase as you and your baby continue to gain weight. You may have abdominal, leg, and back pain, sleeping problems, and an increased need to urinate.  During the third trimester your breasts will keep growing and they will continue to become tender. A yellow fluid (colostrum) may leak from your breasts. This is the first milk you are producing for your baby.  False labor is a condition in which you feel small, irregular tightenings of the muscles in the womb (contractions) that eventually go away. These are called Braxton Hicks contractions. Contractions may last for hours, days, or even weeks before true labor sets in.  Signs of labor can include: abdominal cramps; regular contractions that start at 10 minutes apart and become stronger and more frequent with time; watery or bloody mucus discharge that comes from the vagina; increased pelvic pressure and dull back pain; and leaking of amniotic fluid. This information is not intended to replace advice  given to you by your health care provider. Make sure you discuss any questions you have with your health care provider. Document Released: 05/24/2001 Document Revised: 11/05/2015 Document Reviewed: 07/31/2012 Elsevier Interactive Patient Education  2017 [employer] Dental Pain Dental pain may be caused by many things, including:  Tooth decay (cavities or caries). Cavities cause the nerve of your tooth to be open to air and hot or cold temperatures. This can cause pain or discomfort.  Abscess or infection. A dental abscess is an area that is full of infected pus from a bacterial infection in the inner part of the tooth (pulp). It usually happens at the end of the tooths root.  Injury.  An unknown reason (idiopathic).  Your pain may be mild or severe. It may only happen when:  You are chewing.  You are exposed to hot or cold temperature.  You are eating or drinking sugary foods or beverages, such as: ? Soda. ? Candy.  Your pain may also be there all of the time. Follow these instructions at home: Watch your dental pain for any changes. Do these things to lessen your discomfort:  Take medicines only as told by your dentist.  If your dentist tells you to take an antibiotic medicine, finish all of it even if you start to feel better.  Keep all follow-up visits as told by your dentist. This is important.  Do not apply heat to the outside of your face.  Rinse your mouth or gargle with salt water if told by your dentist. This helps with pain and swelling. ? You can make salt water by adding  tsp of salt to 1 cup of warm water.  Apply ice to the painful area of your face: ? Put ice in a plastic bag. ? Place a towel between your skin and the bag. ? Leave the ice on for 20 minutes, 2-3 times per day.  Avoid foods or drinks that cause you pain, such as: ? Very hot or very cold foods or drinks. ?  Sweet or sugary foods or drinks.  Contact a doctor if:  Your pain is not helped  with medicines.  Your symptoms are worse.  You have new symptoms. Get help right away if:  You cannot open your mouth.  You are having trouble breathing or swallowing.  You have a fever.  Your face, neck, or jaw is puffy (swollen). This information is not intended to replace advice given to you by your health care provider. Make sure you discuss any questions you have with your health care provider. Document Released: 11/16/2007 Document Revised: 11/05/2015 Document Reviewed: 05/26/2014 Elsevier Interactive Patient Education  [employer].

## 2016-11-25 NOTE — MAU Provider Note (Signed)
History     CSN: [id number]  Arrival date and time: 11/25/16 1003   First Provider Initiated Contact with Patient 11/25/16 1038      Chief Complaint  Patient presents with  . Dental Pain  . Vaginal Pain   HPI Monique Harris is 26 y.o. [phone] [redacted]w[redacted]d weeks presenting with upper right tooth pain that began 2 days ago.  Sxs worsened last night.  Describes as throbbing. States she is unable to eat and she is hungry.  Took neighbor's pain relief medication that she doesn't know the name.  It did not help sxs.  Due 11/30/16.  Patient at the [facility].  Denies fever, chills, and facial swelling.       Past Medical History:  Diagnosis Date  . Abnormal Pap smear     Past Surgical History:  Procedure Laterality Date  . CESAREAN SECTION  01/02/2012   Procedure: CESAREAN SECTION;  Surgeon: Tilda Burrow, MD;  Location: [facility];  Service: Gynecology;  Laterality: N/A;  Primary cesarean section of baby boy ay 0125 APGAR 8/9. NRFHT, Abruption=5%    Family History  Problem Relation Age of Onset  . Anesthesia problems Neg Hx   . Other Neg Hx     Social History  Substance Use Topics  . Smoking status: Current Every Day Smoker    Packs/day: 0.25    Types: Cigarettes    Last attempt to quit: 05/12/2011  . Smokeless tobacco: Current User  . Alcohol use No    Allergies: No Known Allergies  Prescriptions Prior to Admission  Medication Sig Dispense Refill Last Dose  . calcium carbonate (TUMS - DOSED IN MG ELEMENTAL CALCIUM) 500 MG chewable tablet Chew 1 tablet by mouth 2 (two) times daily as needed for indigestion or heartburn.   Past Month at Unknown time  . Prenatal Vit-Fe Fumarate-FA (PRENATAL MULTIVITAMIN) TABS tablet Take 1 tablet by mouth daily at 12 noon.   11/20/2016 at Unknown time    Review of Systems  Constitutional: Negative for chills and fever.  HENT: Positive for dental problem. Negative for facial swelling.        Upper right "back: tooth pain  Genitourinary:       + for  fetal movement.  Neg for vaginal bleeding or  Loss of fluid.   Physical Exam   Temperature 98.5 F (36.9 C), resp. rate 16, last menstrual period 02/23/2016.  Physical Exam  Constitutional: She is oriented to person, place, and time. She appears well-developed and well-nourished. Distressed: uncomfortable, holding right side of her face.  HENT:  Head: Normocephalic.  Right upper molar-patient's area of pain does not exhibit any redness or swelling.  Neg for purulent discharge or odor.    Cardiovascular: Normal rate.   Genitourinary:  Genitourinary Comments: Deferred-neg for pelvic/pregnancy sxs.  Lymphadenopathy:    She has no cervical adenopathy.  Neurological: She is alert and oriented to person, place, and time.  Skin: Skin is warm and dry.  Psychiatric: She has a normal mood and affect. Thought content normal.   MAU Course  Procedures  MDM MSE Exam Rx for Percocet for pain Discussed with Dr. Ladean Raya NST Assessment and Plan  A:  Toothache-right upper molar       [redacted]w[redacted]d gestation  P:  Rx for Percocet #6, no refills       List of 3 dentists given for her to call to make appointment.      Continue prenatal care as scheduled   Eve M Key  11/25/2016, 10:40 AM

## 2016-11-25 NOTE — MAU Note (Signed)
Pt having a toothache on upper right side, started 2 days ago. Pain 7/10. Also having pain in her urethral opening. 7/10. No burning when she urinates, more like intermittent pressure.

## 2016-11-29 ENCOUNTER — Inpatient Hospital Stay (HOSPITAL_COMMUNITY): Payer: Medicaid Other | Admitting: Anesthesiology

## 2016-11-29 ENCOUNTER — Encounter (HOSPITAL_COMMUNITY): Payer: Self-pay

## 2016-11-29 ENCOUNTER — Inpatient Hospital Stay (HOSPITAL_COMMUNITY)
Admission: AD | Admit: 2016-11-29 | Discharge: 2016-12-01 | DRG: 775 | Disposition: A | Discharge status: Home / Self Care | Payer: Medicaid Other | Source: Ambulatory Visit | Attending: Family Medicine | Admitting: Family Medicine

## 2016-11-29 DIAGNOSIS — O99824 Streptococcus B carrier state complicating childbirth: Principal | ICD-10-CM | POA: Diagnosis present

## 2016-11-29 DIAGNOSIS — O99334 Smoking (tobacco) complicating childbirth: Secondary | ICD-10-CM | POA: Diagnosis present

## 2016-11-29 DIAGNOSIS — O34211 Maternal care for low transverse scar from previous cesarean delivery: Secondary | ICD-10-CM | POA: Diagnosis present

## 2016-11-29 DIAGNOSIS — Z3A40 40 weeks gestation of pregnancy: Secondary | ICD-10-CM

## 2016-11-29 DIAGNOSIS — Z3493 Encounter for supervision of normal pregnancy, unspecified, third trimester: Secondary | ICD-10-CM | POA: Diagnosis present

## 2016-11-29 DIAGNOSIS — F17210 Nicotine dependence, cigarettes, uncomplicated: Secondary | ICD-10-CM | POA: Diagnosis present

## 2016-11-29 LAB — CBC
HCT: 35.1 % — ABNORMAL LOW (ref 36.0–46.0)
Hemoglobin: 11.9 g/dL — ABNORMAL LOW (ref 12.0–15.0)
MCH: 27.9 pg (ref 26.0–34.0)
MCHC: 33.9 g/dL (ref 30.0–36.0)
MCV: 82.2 fL (ref 78.0–100.0)
PLATELETS: 232 10*3/uL (ref 150–400)
RBC: 4.27 MIL/uL (ref 3.87–5.11)
RDW: 14.6 % (ref 11.5–15.5)
WBC: 9.6 10*3/uL (ref 4.0–10.5)

## 2016-11-29 LAB — TYPE AND SCREEN
ABO/RH(D): O POS
Antibody Screen: NEGATIVE

## 2016-11-29 MED ORDER — LIDOCAINE HCL (PF) 1 % IJ SOLN
30.0000 mL | INTRAMUSCULAR | Status: DC | PRN
  Filled 2016-11-29: qty 30

## 2016-11-29 MED ORDER — SOD CITRATE-CITRIC ACID 500-334 MG/5ML PO SOLN
30.0000 mL | ORAL | Status: DC | PRN
  Administered 2016-11-29: 30 mL via ORAL
  Filled 2016-11-29: qty 15

## 2016-11-29 MED ORDER — EPHEDRINE 5 MG/ML INJ
10.0000 mg | INTRAVENOUS | Status: DC | PRN
  Filled 2016-11-29: qty 2

## 2016-11-29 MED ORDER — LACTATED RINGERS IV SOLN: 500.0000 mL | Freq: Once | INTRAVENOUS | Status: DC

## 2016-11-29 MED ORDER — OXYTOCIN BOLUS FROM INFUSION
500.0000 mL | Freq: Once | INTRAVENOUS | Status: AC
  Administered 2016-11-30: 500 mL via INTRAVENOUS

## 2016-11-29 MED ORDER — FENTANYL CITRATE (PF) 100 MCG/2ML IJ SOLN: 100.0000 ug | INTRAMUSCULAR | Status: DC | PRN

## 2016-11-29 MED ORDER — EPHEDRINE 5 MG/ML INJ: 10.0000 mg | INTRAVENOUS | Status: DC | PRN

## 2016-11-29 MED ORDER — PHENYLEPHRINE 40 MCG/ML (10ML) SYRINGE FOR IV PUSH (FOR BLOOD PRESSURE SUPPORT)
80.0000 ug | PREFILLED_SYRINGE | INTRAVENOUS | Status: DC | PRN
  Filled 2016-11-29: qty 5

## 2016-11-29 MED ORDER — LIDOCAINE HCL (PF) 1 % IJ SOLN
INTRAMUSCULAR | Status: DC | PRN
  Administered 2016-11-29 (×2): 5 mL via EPIDURAL

## 2016-11-29 MED ORDER — PHENYLEPHRINE 40 MCG/ML (10ML) SYRINGE FOR IV PUSH (FOR BLOOD PRESSURE SUPPORT)
80.0000 ug | PREFILLED_SYRINGE | INTRAVENOUS | Status: DC | PRN
  Filled 2016-11-29: qty 5
  Filled 2016-11-29: qty 10

## 2016-11-29 MED ORDER — PENICILLIN G POT IN DEXTROSE 60000 UNIT/ML IV SOLN
3.0000 10*6.[IU] | INTRAVENOUS | Status: DC
  Administered 2016-11-29: 3 10*6.[IU] via INTRAVENOUS
  Filled 2016-11-29 (×5): qty 50

## 2016-11-29 MED ORDER — FENTANYL 2.5 MCG/ML BUPIVACAINE 1/10 % EPIDURAL INFUSION (WH - ANES)
14.0000 mL/h | INTRAMUSCULAR | Status: DC | PRN
  Administered 2016-11-29 (×2): 14 mL/h via EPIDURAL
  Filled 2016-11-29: qty 100

## 2016-11-29 MED ORDER — LACTATED RINGERS IV SOLN: 500.0000 mL | INTRAVENOUS | Status: DC | PRN

## 2016-11-29 MED ORDER — ONDANSETRON HCL 4 MG/2ML IJ SOLN: 4.0000 mg | Freq: Four times a day (QID) | INTRAMUSCULAR | Status: DC | PRN

## 2016-11-29 MED ORDER — OXYTOCIN 40 UNITS IN LACTATED RINGERS INFUSION - SIMPLE MED
2.5000 [IU]/h | INTRAVENOUS | Status: DC
  Administered 2016-11-30: 2.5 [IU]/h via INTRAVENOUS
  Filled 2016-11-29: qty 1000

## 2016-11-29 MED ORDER — LACTATED RINGERS IV SOLN
INTRAVENOUS | Status: DC
  Administered 2016-11-29: 16:00:00 via INTRAVENOUS

## 2016-11-29 MED ORDER — DIPHENHYDRAMINE HCL 50 MG/ML IJ SOLN: 12.5000 mg | INTRAMUSCULAR | Status: DC | PRN

## 2016-11-29 MED ORDER — OXYCODONE-ACETAMINOPHEN 5-325 MG PO TABS
2.0000 | ORAL_TABLET | ORAL | Status: DC | PRN
Start: 2016-11-29 — End: 2016-11-30

## 2016-11-29 MED ORDER — PENICILLIN G POTASSIUM 5000000 UNITS IJ SOLR
5.0000 10*6.[IU] | Freq: Once | INTRAMUSCULAR | Status: AC
  Administered 2016-11-29: 5 10*6.[IU] via INTRAVENOUS
  Filled 2016-11-29: qty 5

## 2016-11-29 MED ORDER — ACETAMINOPHEN 325 MG PO TABS: 650.0000 mg | ORAL_TABLET | ORAL | Status: DC | PRN

## 2016-11-29 MED ORDER — OXYCODONE-ACETAMINOPHEN 5-325 MG PO TABS: 1.0000 | ORAL_TABLET | ORAL | Status: DC | PRN

## 2016-11-29 MED ORDER — FENTANYL 2.5 MCG/ML BUPIVACAINE 1/10 % EPIDURAL INFUSION (WH - ANES): 14.0000 mL/h | INTRAMUSCULAR | Status: DC | PRN

## 2016-11-29 MED ORDER — PHENYLEPHRINE 40 MCG/ML (10ML) SYRINGE FOR IV PUSH (FOR BLOOD PRESSURE SUPPORT): 80.0000 ug | PREFILLED_SYRINGE | INTRAVENOUS | Status: DC | PRN

## 2016-11-29 NOTE — Progress Notes (Addendum)
Labor Progress Note Monique Harris is a 26 y.o. [phone] at 2669w0d presented for SOL, TOLAC. S: Comfortable s/p epidural. Denies vagina/rectal pressure or urge to push.   O:  BP 119/83   Pulse 68   Temp 97.9 F (36.6 C) (Oral)   Resp 17   Ht 5\' 3"  (1.6 m)   Wt 72.6 kg (160 lb)   LMP 02/23/2016   SpO2 100%   BMI 28.34 kg/m  EFM: 130/mod/acc/early decels   CVE: Dilation: 9 Effacement (%): 100 Cervical Position: Middle Station: 0 Presentation: Vertex Exam by:: Amaad Byers, MD   A&P: 26 y.o. [id number] 6169w0d admitted for SOL  #Labor: TOLAC. Progressing well along partogram. AROM to thin meconium. Continue expectant management.  #Pain: Epidural  #FWB: Cat I  #GBS positive s/p 2 doses IV PCN   Al CorpusMatthew R Castiel Lauricella, MD 9:40 PM
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
## 2016-11-29 NOTE — MAU Note (Signed)
Patient presents with c/o ctx every 7 mins. Patient is also having some bloody show. Patient denies LOF. Fetus active.

## 2016-11-29 NOTE — Anesthesia Pain Management Evaluation Note (Signed)
  CRNA Pain Management Visit Note  Patient: Monique Harris, 26 y.o., female  "Hello I am a member of the anesthesia team at [facility]. We have an anesthesia team available at all times to provide care throughout the hospital, including epidural management and anesthesia for C-section. I don't know your plan for the delivery whether it a natural birth, water birth, IV sedation, nitrous supplementation, doula or epidural, but we want to meet your pain goals."   1.Was your pain managed to your expectations on prior hospitalizations?   Yes   2.What is your expectation for pain management during this hospitalization?     Epidural  3.How can we help you reach that goal? Epidural infusing, patient pain relieved. Be available  Record the patient's initial score and the patient's pain goal.   Pain: 0  Pain Goal: 5 [facility] wants you to be able to say your pain was always managed very well.  [facility] CenterMERRITT,Delshawn Stech 11/29/2016

## 2016-11-29 NOTE — Anesthesia Procedure Notes (Signed)
Epidural Patient location during procedure: OB Start time: 11/29/2016 4:14 PM End time: 11/29/2016 4:22 PM  Staffing Anesthesiologist: Chaney MallingHODIERNE, Jozlin Bently Performed: anesthesiologist   Preanesthetic Checklist Completed: patient identified, site marked, pre-op evaluation, timeout performed, IV checked, risks and benefits discussed and monitors and equipment checked  Epidural Patient position: sitting Prep: DuraPrep Patient monitoring: heart rate, cardiac monitor, continuous pulse ox and blood pressure Approach: midline Location: L2-L3 Injection technique: LOR saline  Needle:  Needle type: Tuohy  Needle gauge: 17 G Needle length: 9 cm Needle insertion depth: 6 cm Catheter type: closed end flexible Catheter size: 19 Gauge Catheter at skin depth: 11 cm Test dose: negative and Other  Assessment Events: blood not aspirated, injection not painful, no injection resistance and negative IV test  Additional Notes Informed consent obtained prior to proceeding including risk of failure, 1% risk of PDPH, risk of minor discomfort and bruising.  Discussed rare but serious complications including epidural abscess, permanent nerve injury, epidural hematoma.  Discussed alternatives to epidural analgesia and patient desires to proceed.  Timeout performed pre-procedure verifying patient name, procedure, and platelet count.  Patient tolerated procedure well. Reason for block:procedure for pain

## 2016-11-29 NOTE — Anesthesia Preprocedure Evaluation (Signed)
Anesthesia Evaluation  Patient identified by MRN, date of birth, ID band Patient awake    Reviewed: Allergy & Precautions, NPO status , Patient's Chart, lab work & pertinent test results  Airway Mallampati: II   Neck ROM: full    Dental   Pulmonary Current Smoker,    breath sounds clear to auscultation       Cardiovascular negative cardio ROS   Rhythm:regular Rate:Normal     Neuro/Psych    GI/Hepatic   Endo/Other    Renal/GU      Musculoskeletal   Abdominal   Peds  Hematology   Anesthesia Other Findings   Reproductive/Obstetrics (+) Pregnancy                             Anesthesia Physical Anesthesia Plan  ASA: II  Anesthesia Plan: Epidural   Post-op Pain Management:    Induction: Intravenous  PONV Risk Score and Plan: 1 and Treatment may vary due to age or medical condition  Airway Management Planned: Natural Airway  Additional Equipment:   Intra-op Plan:   Post-operative Plan:   Informed Consent: I have reviewed the patients History and Physical, chart, labs and discussed the procedure including the risks, benefits and alternatives for the proposed anesthesia with the patient or authorized representative who has indicated his/her understanding and acceptance.     Plan Discussed with: Anesthesiologist  Anesthesia Plan Comments:         Anesthesia Quick Evaluation

## 2016-11-29 NOTE — MAU Note (Signed)
Notified provider that patient is here for a labor eval. Patient is 5/70/-2 ctx every 3-5 mins. GBS+. TOLAC. Provider said he would put in admit orders.

## 2016-11-30 ENCOUNTER — Encounter (HOSPITAL_COMMUNITY): Payer: Self-pay

## 2016-11-30 DIAGNOSIS — Z3A40 40 weeks gestation of pregnancy: Secondary | ICD-10-CM

## 2016-11-30 DIAGNOSIS — O99824 Streptococcus B carrier state complicating childbirth: Secondary | ICD-10-CM

## 2016-11-30 LAB — RPR: RPR: NONREACTIVE

## 2016-11-30 MED ORDER — WITCH HAZEL-GLYCERIN EX PADS
1.0000 "application " | MEDICATED_PAD | CUTANEOUS | Status: DC | PRN
  Administered 2016-11-30: 1 via TOPICAL

## 2016-11-30 MED ORDER — ACETAMINOPHEN 325 MG PO TABS
650.0000 mg | ORAL_TABLET | ORAL | Status: DC | PRN
  Administered 2016-11-30: 650 mg via ORAL
  Filled 2016-11-30: qty 2

## 2016-11-30 MED ORDER — ONDANSETRON HCL 4 MG/2ML IJ SOLN: 4.0000 mg | INTRAMUSCULAR | Status: DC | PRN

## 2016-11-30 MED ORDER — PRENATAL MULTIVITAMIN CH
1.0000 | ORAL_TABLET | Freq: Every day | ORAL | Status: DC
  Administered 2016-11-30: 1 via ORAL
  Filled 2016-11-30: qty 1

## 2016-11-30 MED ORDER — ZOLPIDEM TARTRATE 5 MG PO TABS: 5.0000 mg | ORAL_TABLET | Freq: Every evening | ORAL | Status: DC | PRN

## 2016-11-30 MED ORDER — ONDANSETRON HCL 4 MG PO TABS: 4.0000 mg | ORAL_TABLET | ORAL | Status: DC | PRN

## 2016-11-30 MED ORDER — IBUPROFEN 600 MG PO TABS
600.0000 mg | ORAL_TABLET | Freq: Four times a day (QID) | ORAL | Status: DC
  Administered 2016-11-30 – 2016-12-01 (×5): 600 mg via ORAL
  Filled 2016-11-30 (×5): qty 1

## 2016-11-30 MED ORDER — DIPHENHYDRAMINE HCL 25 MG PO CAPS: 25.0000 mg | ORAL_CAPSULE | Freq: Four times a day (QID) | ORAL | Status: DC | PRN

## 2016-11-30 MED ORDER — SENNOSIDES-DOCUSATE SODIUM 8.6-50 MG PO TABS
2.0000 | ORAL_TABLET | ORAL | Status: DC
  Administered 2016-12-01: 2 via ORAL
  Filled 2016-11-30: qty 2

## 2016-11-30 MED ORDER — TETANUS-DIPHTH-ACELL PERTUSSIS 5-2.5-18.5 LF-MCG/0.5 IM SUSP: 0.5000 mL | Freq: Once | INTRAMUSCULAR | Status: DC

## 2016-11-30 MED ORDER — DIBUCAINE 1 % RE OINT
1.0000 "application " | TOPICAL_OINTMENT | RECTAL | Status: DC | PRN
  Administered 2016-11-30: 1 via RECTAL
  Filled 2016-11-30: qty 28

## 2016-11-30 MED ORDER — BENZOCAINE-MENTHOL 20-0.5 % EX AERO
1.0000 "application " | INHALATION_SPRAY | CUTANEOUS | Status: DC | PRN
  Administered 2016-11-30: 1 via TOPICAL
  Filled 2016-11-30: qty 56

## 2016-11-30 MED ORDER — COCONUT OIL OIL: 1.0000 "application " | TOPICAL_OIL | Status: DC | PRN

## 2016-11-30 MED ORDER — SIMETHICONE 80 MG PO CHEW: 80.0000 mg | CHEWABLE_TABLET | ORAL | Status: DC | PRN

## 2016-11-30 NOTE — Anesthesia Postprocedure Evaluation (Signed)
Anesthesia Post Note  Patient: Monique FrayBrittany S Childers  Procedure(s) Performed: * No procedures listed *     Patient location during evaluation: Mother Baby Anesthesia Type: Epidural Level of consciousness: awake Pain management: satisfactory to patient Vital Signs Assessment: post-procedure vital signs reviewed and stable Respiratory status: spontaneous breathing Cardiovascular status: stable Anesthetic complications: no    Last Vitals:  Vitals:   11/30/16 0330 11/30/16 0806  BP: 128/73 104/64  Pulse: 77 65  Resp: 18 20  Temp: 37.3 C 36.8 C    Last Pain:  Vitals:   11/30/16 0809  TempSrc:   PainSc: 4    Pain Goal: Patients Stated Pain Goal: 3 (11/30/16 0809)               Cephus ShellingBURGER,Dailey Alberson

## 2016-11-30 NOTE — Progress Notes (Signed)
Post Partum Day 1, delivered at 2358 Subjective: Eating, drinking, voiding, ambulating well.  +flatus.  Lochia and pain wnl.  Denies dizziness, lightheadedness, or sob. No complaints.   Objective: Blood pressure 128/73, pulse 77, temperature 99.1 F (37.3 C), temperature source Oral, resp. rate 18, height 5\' 3"  (1.6 m), weight 72.6 kg (160 lb), last menstrual period 02/23/2016, SpO2 99 %, unknown if currently breastfeeding.  Physical Exam:  General: alert, cooperative and no distress Lochia: appropriate Uterine Fundus: firm Incision: n/a DVT Evaluation: No evidence of DVT seen on physical exam. Negative Homan's sign. No cords or calf tenderness. No significant calf/ankle edema.   Recent Labs  11/29/16 1525  HGB 11.9*  HCT 35.1*    Assessment/Plan: Plan for discharge tomorrow and Contraception depo, bottlefeeding   LOS: 1 day   Monique Harris, Monique Harris 11/30/2016, 7:01 AM

## 2016-12-01 MED ORDER — IBUPROFEN 600 MG PO TABS: 600.0000 mg | ORAL_TABLET | Freq: Four times a day (QID) | ORAL | 0 refills | Status: DC

## 2016-12-01 MED ORDER — MEDROXYPROGESTERONE ACETATE 150 MG/ML IM SUSP: 150.0000 mg | Freq: Once | INTRAMUSCULAR | Status: DC

## 2016-12-01 NOTE — Discharge Summary (Addendum)
OB Discharge Summary  Patient Name: Monique Harris DOB: 1991-01-17 MRN: [id number]  Date of admission: 11/29/2016 Delivering MD: Mauri ReadingZEITLER, MATTHEW R   Date of discharge: 12/01/2016  Admitting diagnosis: 39WKS CTX, BLEEDING Intrauterine pregnancy: 7195w0d     Secondary diagnosis:Active Problems:   Normal labor  Additional problems: No prenatal care, + GBS     Discharge diagnosis: Term Pregnancy Delivered , VBAC                                                                   Augmentation: AROM  Complications: None  Hospital course:  Onset of Labor With Vaginal Delivery     26 y.o. yo [id number] at 1995w0d was admitted in Active Labor on 11/29/2016. Patient had an uncomplicated labor course as follows:  Membrane Rupture Time/Date: 9:37 PM ,11/29/2016   Intrapartum Procedures: Episiotomy: None [1]                                         Lacerations:  None [1]  Patient had a delivery of a Viable infant. 11/29/2016  Information for the patient's newborn:  Ebony HailSmith, Boy Wisdom [[id number]][[id number]]  Delivery Method: VBAC, Spontaneous (Filed from Delivery Summary)    Pateint had an uncomplicated postpartum course.  She is ambulating, tolerating a regular diet, passing flatus, and urinating well. Patient is discharged home in stable condition on 12/01/16.   Physical exam  Vitals:   11/30/16 0806 11/30/16 1554 11/30/16 1754 12/01/16 0552  BP: 104/64 132/78 (!) 115/55 (!) 101/56  Pulse: 65 74 (!) 59 (!) 58  Resp: 20 18 17 16   Temp: 98.2 F (36.8 C) 98.2 F (36.8 C) 98.5 F (36.9 C) 97.5 F (36.4 C)  TempSrc: Oral Oral Oral Oral  SpO2: 99% 100% 100%   Weight:      Height:       General: alert Lochia: appropriate Uterine Fundus: firm Incision: N/A DVT Evaluation: No evidence of DVT seen on physical exam. Labs: Lab Results  Component Value Date   WBC 9.6 11/29/2016   HGB 11.9 (L) 11/29/2016   HCT 35.1 (L) 11/29/2016   MCV 82.2 11/29/2016   PLT 232 11/29/2016   CMP Latest Ref  Rng & Units 11/26/2011  Glucose 70 - 99 mg/dL 92  BUN 6 - 23 mg/dL 7  Creatinine 1.320.50 - 4.401.10 mg/dL 1.020.50  Sodium 725135 - 366145 mEq/L 138  Potassium 3.5 - 5.1 mEq/L 3.6  Chloride 96 - 112 mEq/L 103  CO2 19 - 32 mEq/L -  Calcium 8.4 - 10.5 mg/dL -  Total Protein 6.0 - 8.3 g/dL -  Total Bilirubin 0.3 - 1.2 mg/dL -  Alkaline Phos 39 - 440117 U/L -  AST 0 - 37 U/L -  ALT 0 - 35 U/L -    Discharge instruction: per After Visit Summary and "Baby and Me Booklet".  After Visit Meds:  Allergies as of 12/01/2016   No Known Allergies     Medication List    TAKE these medications   calcium carbonate 500 MG chewable tablet Commonly known as:  TUMS - dosed in mg elemental calcium Chew 1 tablet by  mouth 2 (two) times daily as needed for indigestion or heartburn.   ibuprofen 600 MG tablet Commonly known as:  ADVIL,MOTRIN Take 1 tablet (600 mg total) by mouth every 6 (six) hours.   oxyCODONE-acetaminophen 5-325 MG tablet Commonly known as:  ROXICET Take 1 tablet by mouth every 8 (eight) hours as needed for severe pain.   prenatal multivitamin Tabs tablet Take 1 tablet by mouth daily at 12 noon.       Diet: routine diet  Activity: Advance as tolerated. Pelvic rest for 6 weeks.   Outpatient follow up:4 weeks Follow up Appt:No future appointments. Follow up visit: No Follow-up on file.  Postpartum contraception: Depo Provera  Newborn Data: Live born female  Birth Weight: 7 lb 0.5 oz (3189 g) APGAR: 8, 9  Baby Feeding: Bottle Disposition:home with mother   12/01/2016 Allie Bossier, MD

## 2016-12-01 NOTE — Discharge Instructions (Signed)

## 2017-05-22 ENCOUNTER — Other Ambulatory Visit: Payer: Self-pay

## 2017-05-22 ENCOUNTER — Inpatient Hospital Stay (HOSPITAL_COMMUNITY): Payer: Self-pay

## 2017-05-22 ENCOUNTER — Encounter (HOSPITAL_COMMUNITY): Payer: Self-pay | Admitting: *Deleted

## 2017-05-22 ENCOUNTER — Inpatient Hospital Stay (HOSPITAL_COMMUNITY)
Admission: AD | Admit: 2017-05-22 | Discharge: 2017-05-22 | Disposition: A | Discharge status: Home / Self Care | Payer: Self-pay | Source: Ambulatory Visit | Attending: Obstetrics and Gynecology | Admitting: Obstetrics and Gynecology

## 2017-05-22 DIAGNOSIS — F17210 Nicotine dependence, cigarettes, uncomplicated: Secondary | ICD-10-CM | POA: Insufficient documentation

## 2017-05-22 DIAGNOSIS — O99331 Smoking (tobacco) complicating pregnancy, first trimester: Secondary | ICD-10-CM | POA: Insufficient documentation

## 2017-05-22 DIAGNOSIS — A5901 Trichomonal vulvovaginitis: Secondary | ICD-10-CM | POA: Insufficient documentation

## 2017-05-22 DIAGNOSIS — O3680X0 Pregnancy with inconclusive fetal viability, not applicable or unspecified: Secondary | ICD-10-CM

## 2017-05-22 DIAGNOSIS — O26899 Other specified pregnancy related conditions, unspecified trimester: Secondary | ICD-10-CM

## 2017-05-22 DIAGNOSIS — Z3A01 Less than 8 weeks gestation of pregnancy: Secondary | ICD-10-CM | POA: Insufficient documentation

## 2017-05-22 DIAGNOSIS — R109 Unspecified abdominal pain: Secondary | ICD-10-CM | POA: Insufficient documentation

## 2017-05-22 DIAGNOSIS — O26891 Other specified pregnancy related conditions, first trimester: Secondary | ICD-10-CM

## 2017-05-22 DIAGNOSIS — O98311 Other infections with a predominantly sexual mode of transmission complicating pregnancy, first trimester: Secondary | ICD-10-CM | POA: Insufficient documentation

## 2017-05-22 LAB — WET PREP, GENITAL
CLUE CELLS WET PREP: NONE SEEN
SPERM: NONE SEEN
Yeast Wet Prep HPF POC: NONE SEEN

## 2017-05-22 LAB — URINALYSIS, ROUTINE W REFLEX MICROSCOPIC
Bacteria, UA: NONE SEEN
Bilirubin Urine: NEGATIVE
GLUCOSE, UA: NEGATIVE mg/dL
Hgb urine dipstick: NEGATIVE
Ketones, ur: NEGATIVE mg/dL
Nitrite: NEGATIVE
PH: 6 (ref 5.0–8.0)
Protein, ur: NEGATIVE mg/dL
SPECIFIC GRAVITY, URINE: 1.024 (ref 1.005–1.030)

## 2017-05-22 LAB — CBC
HEMATOCRIT: 38.7 % (ref 36.0–46.0)
Hemoglobin: 12.4 g/dL (ref 12.0–15.0)
MCH: 28 pg (ref 26.0–34.0)
MCHC: 32 g/dL (ref 30.0–36.0)
MCV: 87.4 fL (ref 78.0–100.0)
Platelets: 293 10*3/uL (ref 150–400)
RBC: 4.43 MIL/uL (ref 3.87–5.11)
RDW: 13 % (ref 11.5–15.5)
WBC: 9.5 10*3/uL (ref 4.0–10.5)

## 2017-05-22 LAB — HCG, QUANTITATIVE, PREGNANCY: hCG, Beta Chain, Quant, S: 84 m[IU]/mL — ABNORMAL HIGH (ref <5)

## 2017-05-22 LAB — POCT PREGNANCY, URINE: Preg Test, Ur: POSITIVE — AB

## 2017-05-22 MED ORDER — METRONIDAZOLE 500 MG PO TABS: 2000.0000 mg | ORAL_TABLET | Freq: Once | ORAL | 0 refills | Status: AC

## 2017-05-22 MED ORDER — METRONIDAZOLE 500 MG PO TABS: 2000.0000 mg | ORAL_TABLET | Freq: Once | ORAL | Status: DC

## 2017-05-22 NOTE — MAU Provider Note (Signed)
History     CSN: [id number]  Arrival date and time: 05/22/17 1759  Chief Complaint  Patient presents with  . Abdominal Pain  . Possible Pregnancy   [id number] @[redacted]w[redacted]d  by LMP here with lower abdominal cramping. Sx occurred earlier today for which she used Tylenol and then subsided. No pain currently. No urinary sx. No VB or discharge. No BM in 3 days. She knows her menses is late but has not taken a HPT. She is not using contraception.    OB History    Gravida Para Term Preterm AB Living   8 5 5  0 2 5   SAB TAB Ectopic Multiple Live Births   0 2 0 0 5      Past Medical History:  Diagnosis Date  . Abnormal Pap smear   . Medical history non-contributory     Past Surgical History:  Procedure Laterality Date  . CESAREAN SECTION  01/02/2012   Procedure: CESAREAN SECTION;  Surgeon: Tilda BurrowJohn V Ferguson, MD;  Location: [facility];  Service: Gynecology;  Laterality: N/A;  Primary cesarean section of baby boy ay 0125 APGAR 8/9. NRFHT, Abruption=5%    Family History  Problem Relation Age of Onset  . Anesthesia problems Neg Hx   . Other Neg Hx     Social History   Tobacco Use  . Smoking status: Current Every Day Smoker    Packs/day: 0.25    Types: Cigarettes    Last attempt to quit: 05/12/2011    Years since quitting: 6.0  . Smokeless tobacco: Current User  Substance Use Topics  . Alcohol use: No  . Drug use: No    Allergies: No Known Allergies  Medications Prior to Admission  Medication Sig Dispense Refill Last Dose  . calcium carbonate (TUMS - DOSED IN MG ELEMENTAL CALCIUM) 500 MG chewable tablet Chew 1 tablet by mouth 2 (two) times daily as needed for indigestion or heartburn.   Past Month at Unknown time  . ibuprofen (ADVIL,MOTRIN) 600 MG tablet Take 1 tablet (600 mg total) by mouth every 6 (six) hours. 30 tablet 0   . oxyCODONE-acetaminophen (ROXICET) 5-325 MG tablet Take 1 tablet by mouth every 8 (eight) hours as needed for severe pain. 6 tablet 0 11/27/2016  . Prenatal  Vit-Fe Fumarate-FA (PRENATAL MULTIVITAMIN) TABS tablet Take 1 tablet by mouth daily at 12 noon.   11/28/2016 at Unknown time    Review of Systems  Constitutional: Negative for chills and fever.  Gastrointestinal: Positive for constipation, nausea (in am) and vomiting (occ). Negative for abdominal pain.  Genitourinary: Negative for dysuria, frequency, urgency, vaginal bleeding and vaginal discharge.   Physical Exam   Blood pressure 121/81, pulse (!) 107, temperature 98.6 F (37 C), temperature source Oral, resp. rate 16, weight 153 lb (69.4 kg), last menstrual period 04/20/2017, SpO2 100 %, unknown if currently breastfeeding.  Physical Exam  Nursing note and vitals reviewed. Constitutional: She is oriented to person, place, and time. She appears well-developed and well-nourished.  HENT:  Head: Normocephalic and atraumatic.  Neck: Normal range of motion.  Respiratory: Effort normal. No respiratory distress.  GI: Soft. She exhibits no distension and no mass. There is no tenderness. There is no rebound and no guarding.  Genitourinary:  Genitourinary Comments: External: no lesions or erythema Vagina: rugated, pink, moist, thin white discharge Uterus: non enlarged, anteverted, + tender, no CMT Adnexae: no masses, no tenderness left, no tenderness right   Musculoskeletal: Normal range of motion.  Neurological: She is alert and oriented  to person, place, and time.  Skin: Skin is warm and dry.  Psychiatric: She has a normal mood and affect.   Results for orders placed or performed during the hospital encounter of 05/22/17 (from the past 24 hour(s))  Urinalysis, Routine w reflex microscopic     Status: Abnormal   Collection Time: 05/22/17  6:15 PM  Result Value Ref Range   Color, Urine YELLOW YELLOW   APPearance CLEAR CLEAR   Specific Gravity, Urine 1.024 1.005 - 1.030   pH 6.0 5.0 - 8.0   Glucose, UA NEGATIVE NEGATIVE mg/dL   Hgb urine dipstick NEGATIVE NEGATIVE   Bilirubin Urine  NEGATIVE NEGATIVE   Ketones, ur NEGATIVE NEGATIVE mg/dL   Protein, ur NEGATIVE NEGATIVE mg/dL   Nitrite NEGATIVE NEGATIVE   Leukocytes, UA TRACE (A) NEGATIVE   RBC / HPF 0-5 0 - 5 RBC/hpf   WBC, UA 6-30 0 - 5 WBC/hpf   Bacteria, UA NONE SEEN NONE SEEN   Squamous Epithelial / LPF 0-5 (A) NONE SEEN   Mucus PRESENT   Pregnancy, urine POC     Status: Abnormal   Collection Time: 05/22/17  6:27 PM  Result Value Ref Range   Preg Test, Ur POSITIVE (A) NEGATIVE  CBC     Status: None   Collection Time: 05/22/17  6:36 PM  Result Value Ref Range   WBC 9.5 4.0 - 10.5 K/uL   RBC 4.43 3.87 - 5.11 MIL/uL   Hemoglobin 12.4 12.0 - 15.0 g/dL   HCT 16.1 09.6 - 04.5 %   MCV 87.4 78.0 - 100.0 fL   MCH 28.0 26.0 - 34.0 pg   MCHC 32.0 30.0 - 36.0 g/dL   RDW 40.9 81.1 - 91.4 %   Platelets 293 150 - 400 K/uL  hCG, quantitative, pregnancy     Status: Abnormal   Collection Time: 05/22/17  6:36 PM  Result Value Ref Range   hCG, Beta Chain, Quant, S 84 (H) <5 mIU/mL  Wet prep, genital     Status: Abnormal   Collection Time: 05/22/17  6:45 PM  Result Value Ref Range   Yeast Wet Prep HPF POC NONE SEEN NONE SEEN   Trich, Wet Prep PRESENT (A) NONE SEEN   Clue Cells Wet Prep HPF POC NONE SEEN NONE SEEN   WBC, Wet Prep HPF POC MODERATE (A) NONE SEEN   Sperm NONE SEEN    US Ob Comp Less 14 Wks  Result Date: 05/22/2017 CLINICAL DATA:  Pain with positive pregnancy test. EXAM: OBSTETRIC <14 WK [place] AND TRANSVAGINAL OB US TECHNIQUE: Both transabdominal and transvaginal ultrasound examinations were performed for complete evaluation of the gestation as well as the maternal uterus, adnexal regions, and pelvic cul-de-sac. Transvaginal technique was performed to assess early pregnancy. COMPARISON:  None. FINDINGS: Intrauterine gestational sac: Not visualized. Yolk sac:  Not visualized. Embryo:  Not visualized. Subchorionic hemorrhage:  None visualized. Maternal uterus/adnexae: Maternal ovaries unremarkable. Small volume  simple appearing free fluid noted in the cul-de-sac. IMPRESSION: 1. No intrauterine gestational sac identified. No evidence for adnexal mass. Given the history of a positive pregnancy test, differential considerations include intrauterine gestation too early to visualize, completed abortion, or nonvisualized ectopic pregnancy. Close clinical correlation is recommended with serial beta-hCG and followup ultrasound as warranted. Electronically Signed   By: Kennith Center M.D.   On: 05/22/2017 19:27   US Ob Transvaginal  Result Date: 05/22/2017 CLINICAL DATA:  Pain with positive pregnancy test. EXAM: OBSTETRIC <14 WK [place] AND TRANSVAGINAL OB  US TECHNIQUE: Both transabdominal and transvaginal ultrasound examinations were performed for complete evaluation of the gestation as well as the maternal uterus, adnexal regions, and pelvic cul-de-sac. Transvaginal technique was performed to assess early pregnancy. COMPARISON:  None. FINDINGS: Intrauterine gestational sac: Not visualized. Yolk sac:  Not visualized. Embryo:  Not visualized. Subchorionic hemorrhage:  None visualized. Maternal uterus/adnexae: Maternal ovaries unremarkable. Small volume simple appearing free fluid noted in the cul-de-sac. IMPRESSION: 1. No intrauterine gestational sac identified. No evidence for adnexal mass. Given the history of a positive pregnancy test, differential considerations include intrauterine gestation too early to visualize, completed abortion, or nonvisualized ectopic pregnancy. Close clinical correlation is recommended with serial beta-hCG and followup ultrasound as warranted. Electronically Signed   By: Kennith CenterEric  Mansell M.D.   On: 05/22/2017 19:27   MAU Course  Procedures  MDM Labs and US ordered and reviewed. +trich, pt admits to another partner in October and possible exposure then. Pt requests Rx instead of tx here, and current partner treatment. Rx for Flagyl 2g written for Corky MullMark Hunt DOB [birth year]05[birth year]. Instructed pt to notify  previous partner for treatment, and abstain for 2 weeks after her and current partner have been treated. Low quant and no IUP seen, cannot r/o early pregnancy, failed pregnancy, or ectopic therefore will follow quant HCG after 48 hrs. Stable for discharge home.   Assessment and Plan   1. Pregnancy, location unknown   2. Abdominal cramping affecting pregnancy   3. Trichomonal vaginitis    Discharge home Follow up in [facility] on 05/25/17 at 11:00 am Ectopic/return precautions Rx Flagyl  Allergies as of 05/22/2017   No Known Allergies     Medication List    STOP taking these medications   calcium carbonate 500 MG chewable tablet Commonly known as:  TUMS - dosed in mg elemental calcium   ibuprofen 600 MG tablet Commonly known as:  ADVIL,MOTRIN   oxyCODONE-acetaminophen 5-325 MG tablet Commonly known as:  ROXICET     TAKE these medications   metroNIDAZOLE 500 MG tablet Commonly known as:  FLAGYL Take 4 tablets (2,000 mg total) by mouth once for 1 dose.   prenatal multivitamin Tabs tablet Take 1 tablet by mouth daily at 12 noon.      Donette LarryMelanie Jennice Renegar, CNM 05/22/2017, 8:08 PM

## 2017-05-22 NOTE — Discharge Instructions (Signed)
Abdominal Pain During Pregnancy °Abdominal pain is common in pregnancy. Most of the time, it does not cause harm. There are many causes of abdominal pain. Some causes are more serious than others and sometimes the cause is not known. Abdominal pain can be a sign that something is very wrong with the pregnancy or the pain may have nothing to do with the pregnancy. Always tell your health care provider if you have any abdominal pain. °Follow these instructions at home: °· Do not have sex or put anything in your vagina until your symptoms go away completely. °· Watch your abdominal pain for any changes. °· Get plenty of rest until your pain improves. °· Drink enough fluid to keep your urine clear or pale yellow. °· Take over-the-counter or prescription medicines only as told by your health care provider. °· Keep all follow-up visits as told by your health care provider. This is important. °Contact a health care provider if: °· You have a fever. °· Your pain gets worse or you have cramping. °· Your pain continues after resting. °Get help right away if: °· You are bleeding, leaking fluid, or passing tissue from the vagina. °· You have vomiting or diarrhea that does not go away. °· You have painful or bloody urination. °· You notice a decrease in your baby's movements. °· You feel very weak or faint. °· You have shortness of breath. °· You develop a severe headache with abdominal pain. °· You have abnormal vaginal discharge with abdominal pain. °This information is not intended to replace advice given to you by your health care provider. Make sure you discuss any questions you have with your health care provider. °Document Released: 05/30/2005 Document Revised: 03/10/2016 Document Reviewed: 12/27/2012 °Elsevier Interactive Patient Education © 2018 [employer] ° °Trichomoniasis °Trichomoniasis is an STI (sexually transmitted infection) that can affect both women and men. In women, the outer area of the female genitalia  (vulva) and the vagina are affected. In men, the penis is mainly affected, but the prostate and other reproductive organs can also be involved. This condition can be treated with medicine. It often has no symptoms (is asymptomatic), especially in men. °What are the causes? °This condition is caused by an organism called Trichomonas vaginalis. Trichomoniasis most often spreads from person to person (is contagious) through sexual contact. °What increases the risk? °The following factors may make you more likely to develop this condition: °· Having unprotected sexual intercourse. °· Having sexual intercourse with a partner who has trichomoniasis. °· Having multiple sexual partners. °· Having had previous trichomoniasis infections or other STIs. ° °What are the signs or symptoms? °In women, symptoms of trichomoniasis include: °· Abnormal vaginal discharge that is clear, white, gray, or yellow-green and foamy and has an unusual "fishy" odor. °· Itching and irritation of the vagina and vulva. °· Burning or pain during urination or sexual intercourse. °· Genital redness and swelling. ° °In men, symptoms of trichomoniasis include: °· Penile discharge that may be foamy or contain pus. °· Pain in the penis. This may happen only when urinating. °· Itching or irritation inside the penis. °· Burning after urination or ejaculation. ° °How is this diagnosed? °In women, this condition may be found during a routine Pap test or physical exam. It may be found in men during a routine physical exam. Your health care provider may perform tests to help diagnose this infection, such as: °· Urine tests (men and women). °· The following in women: °? Testing the pH of   the vagina. °? A vaginal swab test that checks for the Trichomonas vaginalis organism. °? Testing vaginal secretions. ° °Your health care provider may test you for other STIs, including HIV (human immunodeficiency virus). °How is this treated? °This condition is treated with  medicine taken by mouth (orally), such as metronidazole or tinidazole to fight the infection. Your sexual partner(s) may also need to be tested and treated. °· If you are a woman and you plan to become pregnant or think you may be pregnant, tell your health care provider right away. Some medicines that are used to treat the infection should not be taken during pregnancy. ° °Your health care provider may recommend over-the-counter medicines or creams to help relieve itching or irritation. You may be tested for infection again 3 months after treatment. °Follow these instructions at home: °· Take and use over-the-counter and prescription medicines, including creams, only as told by your health care provider. °· Do not have sexual intercourse until one week after you finish your medicine, or until your health care provider approves. Ask your health care provider when you may resume sexual intercourse. °· (Women) Do not douche or wear tampons while you have the infection. °· Discuss your infection with your sexual partner(s). Make sure that your partner gets tested and treated, if necessary. °· Keep all follow-up visits as told by your health care provider. This is important. °How is this prevented? °· Use condoms every time you have sex. Using condoms correctly and consistently can help protect against STIs. °· Avoid having multiple sexual partners. °· Talk with your sexual partner about any symptoms that either of you may have, as well as any history of STIs. °· Get tested for STIs and STDs (sexually transmitted diseases) before you have sex. Ask your partner to do the same. °· Do not have sexual contact if you have symptoms of trichomoniasis or another STI. °Contact a health care provider if: °· You still have symptoms after you finish your medicine. °· You develop pain in your abdomen. °· You have pain when you urinate. °· You have bleeding after sexual intercourse. °· You develop a rash. °· You feel nauseous or you  vomit. °· You plan to become pregnant or think you may be pregnant. °Summary °· Trichomoniasis is an STI (sexually transmitted infection) that can affect both women and men. °· This condition often has no symptoms (is asymptomatic), especially in men. °· You should not have sexual intercourse until one week after you finish your medicine, or until your health care provider approves. Ask your health care provider when you may resume sexual intercourse. °· Discuss your infection with your sexual partner. Make sure that your partner gets tested and treated, if necessary. °This information is not intended to replace advice given to you by your health care provider. Make sure you discuss any questions you have with your health care provider. °Document Released: 11/23/2000 Document Revised: 04/22/2016 Document Reviewed: 04/22/2016 °Elsevier Interactive Patient Education © 2017 Elsevier Inc. ° °

## 2017-05-22 NOTE — MAU Note (Signed)
Pt presents with c/o lower abdominal cramps that began 3 days ago.  Pt reports pain feels like menstrual cramps.  LMP 04/20/2017, hasn't taken UPT.  Denies VB.  Pt states hasn't had a BM in 4 days, last stool was diarrhea.

## 2017-05-22 NOTE — MAU Note (Signed)
Stomach cramping, started 3 days ago- comes and goes- ok right now- earlier was 8/10.  Period hasn't come yet. Has not done a home test.

## 2017-05-23 LAB — GC/CHLAMYDIA PROBE AMP (~~LOC~~) NOT AT ARMC
Chlamydia: NEGATIVE
NEISSERIA GONORRHEA: NEGATIVE

## 2017-05-25 ENCOUNTER — Other Ambulatory Visit: Payer: Self-pay | Admitting: *Deleted

## 2017-05-25 DIAGNOSIS — O3680X0 Pregnancy with inconclusive fetal viability, not applicable or unspecified: Secondary | ICD-10-CM

## 2017-05-25 LAB — HCG, QUANTITATIVE, PREGNANCY: hCG, Beta Chain, Quant, S: 249 m[IU]/mL — ABNORMAL HIGH (ref <5)

## 2017-05-25 NOTE — Progress Notes (Signed)
Patient here for stat bhcg today. Patient denies pain or bleeding. Discussed with patient having her wait in lobby for results & updated plan of care. Patient verbalized understanding & had no questions at this time

## 2017-05-25 NOTE — Progress Notes (Signed)
Agree with nursing staff's documentation of this patient's clinic encounter.  Catalina AntiguaPeggy Tiffiany Beadles, MD 05/25/2017 12:02 PM

## 2017-05-25 NOTE — Progress Notes (Signed)
Discussed results with Dr. Jolayne Pantheronstant and informed patient hcg level has risen but need to get US in 7-10 days to check if shows live baby in uterus. Scheduled for 06/01/17 per patient request. She voices understanding.

## 2017-06-01 ENCOUNTER — Other Ambulatory Visit: Payer: Self-pay | Admitting: Obstetrics and Gynecology

## 2017-06-01 ENCOUNTER — Ambulatory Visit: Payer: Self-pay | Admitting: General Practice

## 2017-06-01 ENCOUNTER — Ambulatory Visit (HOSPITAL_COMMUNITY)
Admission: RE | Admit: 2017-06-01 | Discharge: 2017-06-01 | Disposition: A | Discharge status: Home / Self Care | Payer: Self-pay | Source: Ambulatory Visit | Attending: Obstetrics and Gynecology | Admitting: Obstetrics and Gynecology

## 2017-06-01 DIAGNOSIS — O3680X0 Pregnancy with inconclusive fetal viability, not applicable or unspecified: Secondary | ICD-10-CM

## 2017-06-01 DIAGNOSIS — Z3A01 Less than 8 weeks gestation of pregnancy: Secondary | ICD-10-CM | POA: Insufficient documentation

## 2017-06-01 DIAGNOSIS — Z3490 Encounter for supervision of normal pregnancy, unspecified, unspecified trimester: Secondary | ICD-10-CM | POA: Insufficient documentation

## 2017-06-01 DIAGNOSIS — Z712 Person consulting for explanation of examination or test findings: Secondary | ICD-10-CM

## 2017-06-01 NOTE — Progress Notes (Signed)
I reviewed th US now showing progression to viable YS ruling out ectopic pregnancy. [facility]   Katrinka BlazingSmith, [place], CNM 06/01/2017 10:34 AM

## 2017-06-01 NOTE — Progress Notes (Signed)
Patient here for viability results today. Reviewed with Dorathy KinsmanVirginia Tomerlin who finds progressing pregnancy with yolk sac. Patient should begin prenatal care. Informed patient of results & dating information. Encouraged patient to begin prenatal care. Patient verbalized understanding to all & had no questions

## 2017-06-12 ENCOUNTER — Inpatient Hospital Stay (HOSPITAL_COMMUNITY)
Admission: AD | Admit: 2017-06-12 | Discharge: 2017-06-12 | Disposition: A | Discharge status: Home / Self Care | Payer: Self-pay | Source: Ambulatory Visit | Attending: Obstetrics & Gynecology | Admitting: Obstetrics & Gynecology

## 2017-06-12 ENCOUNTER — Inpatient Hospital Stay (HOSPITAL_COMMUNITY): Payer: Self-pay

## 2017-06-12 ENCOUNTER — Encounter (HOSPITAL_COMMUNITY): Payer: Self-pay

## 2017-06-12 ENCOUNTER — Other Ambulatory Visit: Payer: Self-pay

## 2017-06-12 DIAGNOSIS — O208 Other hemorrhage in early pregnancy: Secondary | ICD-10-CM

## 2017-06-12 DIAGNOSIS — O039 Complete or unspecified spontaneous abortion without complication: Secondary | ICD-10-CM | POA: Insufficient documentation

## 2017-06-12 DIAGNOSIS — O209 Hemorrhage in early pregnancy, unspecified: Secondary | ICD-10-CM

## 2017-06-12 LAB — CBC
HCT: 33.4 % — ABNORMAL LOW (ref 36.0–46.0)
HEMOGLOBIN: 11 g/dL — AB (ref 12.0–15.0)
MCH: 27.9 pg (ref 26.0–34.0)
MCHC: 32.9 g/dL (ref 30.0–36.0)
MCV: 84.8 fL (ref 78.0–100.0)
PLATELETS: 258 10*3/uL (ref 150–400)
RBC: 3.94 MIL/uL (ref 3.87–5.11)
RDW: 13 % (ref 11.5–15.5)
WBC: 7.6 10*3/uL (ref 4.0–10.5)

## 2017-06-12 LAB — HCG, QUANTITATIVE, PREGNANCY: HCG, BETA CHAIN, QUANT, S: 5367 m[IU]/mL — AB (ref <5)

## 2017-06-12 NOTE — MAU Note (Signed)
Pt presents to MAU with c/o of vaginal bleeding that started last night and today passed golf ball sized clot. Pt denies abdominal pain.

## 2017-06-12 NOTE — MAU Note (Signed)
Urine in the lab  

## 2017-06-12 NOTE — MAU Provider Note (Signed)
History    First Provider Initiated Contact with Patient 06/12/17 1342      Chief Complaint:  Vaginal Bleeding   Monique Harris is  26 y.o. [id number] Patient's last menstrual period was 04/20/2017.Marland Kitchen. Patient is here for follow up of quantitative HCG and ongoing surveillance of pregnancy status.   She is 3356w4d weeks gestation  by LMP.    Since her last visit, the patient is with new complaint.     ROS Abdominal Pain: Denies Vaginal bleeding: similar to period, passing medium clots and denies passing tissue  Dizziness: denies  O POS  Her previous Quantitative HCG values are: Results for Monique FraySMITH, Keora S (MRN [id number]) as of 06/12/2017 13:10  Ref. Range 05/22/2017 18:36 05/25/2017 09:01  HCG, Beta Chain, Quant, S Latest Ref Range: <5 mIU/mL 84 (H) 249 (H)   Previous US on 06/01/17 show YS, no FP.  Physical Exam   Patient Vitals for the past 24 hrs:  BP Temp Temp src Pulse Resp  06/12/17 1310 119/73 98.2 F (36.8 C) Oral 90 18   Constitutional: Well-nourished female in no apparent distress. No pallor Neuro: Alert and oriented 4 Cardiovascular: Normal rate Respiratory: Normal effort and rate Abdomen: Soft, nontender Gynecological Exam: examination not indicated  Labs: Results for orders placed or performed during the hospital encounter of 06/12/17 (from the past 24 hour(s))  CBC   Collection Time: 06/12/17  1:50 PM  Result Value Ref Range   WBC 7.6 4.0 - 10.5 K/uL   RBC 3.94 3.87 - 5.11 MIL/uL   Hemoglobin 11.0 (L) 12.0 - 15.0 g/dL   HCT 86.533.4 (L) 78.436.0 - 69.646.0 %   MCV 84.8 78.0 - 100.0 fL   MCH 27.9 26.0 - 34.0 pg   MCHC 32.9 30.0 - 36.0 g/dL   RDW 29.513.0 28.411.5 - 13.215.5 %   Platelets 258 150 - 400 K/uL  hCG, quantitative, pregnancy   Collection Time: 06/12/17  1:50 PM  Result Value Ref Range   hCG, Beta Chain, Quant, S 5,367 (H) <5 mIU/mL    Ultrasound Studies:   [place] Ob Comp Less 14 Wks  Result Date: 05/22/2017 CLINICAL DATA:  Pain with positive pregnancy test.  EXAM: OBSTETRIC <14 WK US AND TRANSVAGINAL OB US TECHNIQUE: Both transabdominal and transvaginal ultrasound examinations were performed for complete evaluation of the gestation as well as the maternal uterus, adnexal regions, and pelvic cul-de-sac. Transvaginal technique was performed to assess early pregnancy. COMPARISON:  None. FINDINGS: Intrauterine gestational sac: Not visualized. Yolk sac:  Not visualized. Embryo:  Not visualized. Subchorionic hemorrhage:  None visualized. Maternal uterus/adnexae: Maternal ovaries unremarkable. Small volume simple appearing free fluid noted in the cul-de-sac. IMPRESSION: 1. No intrauterine gestational sac identified. No evidence for adnexal mass. Given the history of a positive pregnancy test, differential considerations include intrauterine gestation too early to visualize, completed abortion, or nonvisualized ectopic pregnancy. Close clinical correlation is recommended with serial beta-hCG and followup ultrasound as warranted. Electronically Signed   By: Kennith CenterEric  Mansell M.D.   On: 05/22/2017 19:27   [place] Ob Transvaginal  Result Date: 06/12/2017 CLINICAL DATA:  Vaginal bleeding beginning yesterday. First trimester pregnancy with inconclusive fetal viability. EXAM: TRANSVAGINAL OB ULTRASOUND TECHNIQUE: Transvaginal ultrasound was performed for complete evaluation of the gestation as well as the maternal uterus, adnexal regions, and pelvic cul-de-sac. COMPARISON:  06/01/2017 FINDINGS: Intrauterine gestational sac: None; previously seen intrauterine gestational sac is no longer visualized Maternal uterus/adnexae: Tiny amount of fluid noted in endometrial cavity. No fibroids identified. Both ovaries  are normal appearance. No adnexal mass or free fluid identified. IMPRESSION: Previously seen intrauterine gestational sac is no longer visualized, consistent with interval spontaneous abortion. No significant maternal uterine or adnexal abnormality identified. Electronically Signed    By: Myles RosenthalJohn  Stahl M.D.   On: 06/12/2017 14:29   [place] Ob Transvaginal  Result Date: 06/01/2017 CLINICAL DATA:  Viability EXAM: TRANSVAGINAL OB ULTRASOUND TECHNIQUE: Transvaginal ultrasound was performed for complete evaluation of the gestation as well as the maternal uterus, adnexal regions, and pelvic cul-de-sac. COMPARISON:  05/22/2017 FINDINGS: Intrauterine gestational sac: Single Yolk sac:  Visualized Embryo:  Not visualized Cardiac Activity: Not visualized Heart Rate:  bpm MSD: 5.3  mm   5 w   2  d CRL:     mm    w  d                  US EDC: Subchorionic hemorrhage:  None visualized. Maternal uterus/adnexae: No adnexal mass or abnormal free fluid. IMPRESSION: Five week 2 day intrauterine pregnancy based on mean sac diameter. No embryo currently. This could be followed with repeat ultrasound in 14 days to ensure expected progression. No acute maternal findings. Electronically Signed   By: Charlett NoseKevin  Dover M.D.   On: 06/01/2017 09:24   [place] Ob Transvaginal  Result Date: 05/22/2017 CLINICAL DATA:  Pain with positive pregnancy test. EXAM: OBSTETRIC <14 WK US AND TRANSVAGINAL OB US TECHNIQUE: Both transabdominal and transvaginal ultrasound examinations were performed for complete evaluation of the gestation as well as the maternal uterus, adnexal regions, and pelvic cul-de-sac. Transvaginal technique was performed to assess early pregnancy. COMPARISON:  None. FINDINGS: Intrauterine gestational sac: Not visualized. Yolk sac:  Not visualized. Embryo:  Not visualized. Subchorionic hemorrhage:  None visualized. Maternal uterus/adnexae: Maternal ovaries unremarkable. Small volume simple appearing free fluid noted in the cul-de-sac. IMPRESSION: 1. No intrauterine gestational sac identified. No evidence for adnexal mass. Given the history of a positive pregnancy test, differential considerations include intrauterine gestation too early to visualize, completed abortion, or nonvisualized ectopic pregnancy. Close clinical  correlation is recommended with serial beta-hCG and followup ultrasound as warranted. Electronically Signed   By: Kennith CenterEric  Mansell M.D.   On: 05/22/2017 19:27    MAU course/MDM: US, CBC, Quantitative hCG ordered  - US shows miscarriage w/out appearance of retained products of conception.  Assessment: Miscarriage  Plan: Discharge home in stable condition. SAB precautions Support given Follow-up Information    [facility]).   Specialty:  Obstetrics and Gynecology Why:  1 week for lab work. 2 weeks for Follow-up appointment Contact information: [address] [place] [id number] [phone]       [facility] Follow up.   Why:  in pregnancy-related emergencies Contact information: [address] [id number] [place] [id number] [phone]         Allergies as of 06/12/2017   No Known Allergies     Medication List    You have not been prescribed any medications.     Katrinka BlazingSmith, [place], CNM 06/12/2017, 3:16 PM  2/3

## 2017-06-12 NOTE — Discharge Instructions (Signed)

## 2017-06-13 NOTE — L&D Delivery Note (Addendum)
Patient: Monique Harris MRN: [id number]  GBS status: Negative  Patient is a 27 y.o. now [id number] s/p NSVD at [redacted]w[redacted]d, who was admitted for SOL. SROM 0h 53m prior to delivery with clear fluid.    Delivery Note At 11:25 PM a viable female was delivered via VBAC, Spontaneous (Presentation: ROA).  APGAR: 9, 9; weight  pending.   Placenta status: spontaneous, intact. Cord: 3 vessel, intact with the following complications: Precipitous delivery  Anesthesia: None   Episiotomy: None Lacerations: None   Suture Repair: N/A Est. Blood Loss (mL):    After two pushes, head delivered ROA. No nuchal cord present. Shoulder and body delivered in usual fashion. Infant with spontaneous cry, placed on mother's abdomen, dried and bulb suctioned. Cord clamped x 2 after 1-minute delay, and cut by family member. Cord blood drawn. Placenta delivered spontaneously with gentle cord traction. Fundus firm with massage and Pitocin. Perineum inspected and found to have no lacerations.   Mom to postpartum.  Baby to Couplet care / Skin to Skin.  [name] [facility] PGY-1  03/30/2018, 11:58 PM  OB FELLOW DELIVERY ATTESTATION  I was gloved and present for the delivery in its entirety, and I agree with the above resident's note.    Basya Casavant L, MD OB Fellow 12:27 AM

## 2017-07-28 ENCOUNTER — Inpatient Hospital Stay (HOSPITAL_COMMUNITY): Payer: Medicaid Other

## 2017-07-28 ENCOUNTER — Encounter (HOSPITAL_COMMUNITY): Payer: Self-pay | Admitting: Medical

## 2017-07-28 ENCOUNTER — Other Ambulatory Visit: Payer: Self-pay

## 2017-07-28 ENCOUNTER — Encounter (HOSPITAL_COMMUNITY): Payer: Self-pay | Admitting: Emergency Medicine

## 2017-07-28 ENCOUNTER — Inpatient Hospital Stay (HOSPITAL_COMMUNITY)
Admission: AD | Admit: 2017-07-28 | Discharge: 2017-07-28 | Disposition: A | Discharge status: Home / Self Care | Payer: Medicaid Other | Source: Ambulatory Visit | Attending: Obstetrics and Gynecology | Admitting: Obstetrics and Gynecology

## 2017-07-28 ENCOUNTER — Ambulatory Visit (HOSPITAL_COMMUNITY)
Admission: EM | Admit: 2017-07-28 | Discharge: 2017-07-28 | Disposition: A | Discharge status: Home / Self Care | Payer: Medicaid Other | Attending: Family Medicine | Admitting: Family Medicine

## 2017-07-28 DIAGNOSIS — Z3201 Encounter for pregnancy test, result positive: Secondary | ICD-10-CM

## 2017-07-28 DIAGNOSIS — M549 Dorsalgia, unspecified: Secondary | ICD-10-CM | POA: Diagnosis not present

## 2017-07-28 DIAGNOSIS — R112 Nausea with vomiting, unspecified: Secondary | ICD-10-CM

## 2017-07-28 DIAGNOSIS — Z3A00 Weeks of gestation of pregnancy not specified: Secondary | ICD-10-CM | POA: Diagnosis not present

## 2017-07-28 DIAGNOSIS — O34219 Maternal care for unspecified type scar from previous cesarean delivery: Secondary | ICD-10-CM | POA: Diagnosis not present

## 2017-07-28 DIAGNOSIS — Z3A01 Less than 8 weeks gestation of pregnancy: Secondary | ICD-10-CM | POA: Diagnosis not present

## 2017-07-28 DIAGNOSIS — R1030 Lower abdominal pain, unspecified: Secondary | ICD-10-CM

## 2017-07-28 DIAGNOSIS — O99331 Smoking (tobacco) complicating pregnancy, first trimester: Secondary | ICD-10-CM | POA: Insufficient documentation

## 2017-07-28 DIAGNOSIS — F17210 Nicotine dependence, cigarettes, uncomplicated: Secondary | ICD-10-CM | POA: Diagnosis not present

## 2017-07-28 DIAGNOSIS — O3680X0 Pregnancy with inconclusive fetal viability, not applicable or unspecified: Secondary | ICD-10-CM | POA: Insufficient documentation

## 2017-07-28 DIAGNOSIS — O468X1 Other antepartum hemorrhage, first trimester: Secondary | ICD-10-CM | POA: Diagnosis not present

## 2017-07-28 DIAGNOSIS — O418X10 Other specified disorders of amniotic fluid and membranes, first trimester, not applicable or unspecified: Secondary | ICD-10-CM

## 2017-07-28 DIAGNOSIS — O26851 Spotting complicating pregnancy, first trimester: Secondary | ICD-10-CM | POA: Insufficient documentation

## 2017-07-28 DIAGNOSIS — N938 Other specified abnormal uterine and vaginal bleeding: Secondary | ICD-10-CM

## 2017-07-28 DIAGNOSIS — R109 Unspecified abdominal pain: Secondary | ICD-10-CM | POA: Insufficient documentation

## 2017-07-28 DIAGNOSIS — O208 Other hemorrhage in early pregnancy: Secondary | ICD-10-CM | POA: Insufficient documentation

## 2017-07-28 DIAGNOSIS — O26891 Other specified pregnancy related conditions, first trimester: Secondary | ICD-10-CM | POA: Diagnosis not present

## 2017-07-28 DIAGNOSIS — R102 Pelvic and perineal pain: Secondary | ICD-10-CM | POA: Diagnosis not present

## 2017-07-28 DIAGNOSIS — N920 Excessive and frequent menstruation with regular cycle: Secondary | ICD-10-CM

## 2017-07-28 DIAGNOSIS — O26899 Other specified pregnancy related conditions, unspecified trimester: Secondary | ICD-10-CM

## 2017-07-28 LAB — CBC WITH DIFFERENTIAL/PLATELET
BASOS ABS: 0 10*3/uL (ref 0.0–0.1)
Basophils Relative: 1 %
Eosinophils Absolute: 0.2 10*3/uL (ref 0.0–0.7)
Eosinophils Relative: 2 %
HEMATOCRIT: 34.9 % — AB (ref 36.0–46.0)
Hemoglobin: 11.7 g/dL — ABNORMAL LOW (ref 12.0–15.0)
LYMPHS PCT: 36 %
Lymphs Abs: 2.9 10*3/uL (ref 0.7–4.0)
MCH: 28.1 pg (ref 26.0–34.0)
MCHC: 33.5 g/dL (ref 30.0–36.0)
MCV: 83.9 fL (ref 78.0–100.0)
MONO ABS: 0.3 10*3/uL (ref 0.1–1.0)
Monocytes Relative: 4 %
NEUTROS ABS: 4.8 10*3/uL (ref 1.7–7.7)
NEUTROS PCT: 59 %
Platelets: 287 10*3/uL (ref 150–400)
RBC: 4.16 MIL/uL (ref 3.87–5.11)
RDW: 13.6 % (ref 11.5–15.5)
WBC: 8.2 10*3/uL (ref 4.0–10.5)

## 2017-07-28 LAB — WET PREP, GENITAL
CLUE CELLS WET PREP: NONE SEEN
Sperm: NONE SEEN
Trich, Wet Prep: NONE SEEN
Yeast Wet Prep HPF POC: NONE SEEN

## 2017-07-28 LAB — POCT URINALYSIS DIP (DEVICE)
BILIRUBIN URINE: NEGATIVE
Glucose, UA: NEGATIVE mg/dL
Hgb urine dipstick: NEGATIVE
KETONES UR: NEGATIVE mg/dL
Leukocytes, UA: NEGATIVE
Nitrite: NEGATIVE
PH: 6.5 (ref 5.0–8.0)
Protein, ur: NEGATIVE mg/dL
Specific Gravity, Urine: >=1.03 (ref 1.005–1.030)
Urobilinogen, UA: 0.2 mg/dL (ref 0.0–1.0)

## 2017-07-28 LAB — POCT PREGNANCY, URINE: PREG TEST UR: POSITIVE — AB

## 2017-07-28 LAB — HCG, QUANTITATIVE, PREGNANCY: HCG, BETA CHAIN, QUANT, S: 1647 m[IU]/mL — AB (ref <5)

## 2017-07-28 NOTE — ED Provider Notes (Signed)
[facility]    CSN: [id number] Arrival date & time: 07/28/17  1005     History   Chief Complaint Chief Complaint  Patient presents with  . Abdominal Pain    HPI Monique Harris is a 27 y.o. female.   27 year old female with history of placenta abruption comes in for 4-day history of intermittent low abdominal pain that radiates to the back.  She has also had headache, nausea, vomiting, palpitations.  States low abdominal pain is cramping in nature, intermittent.  She is also had some loose stools without blood.  She has had 3 episodes of nonbilious nonbloody vomiting with continued nausea. She did notice some spotting 4 days ago, denies vaginal discharge, itching/pain.  Denies urinary symptoms such as frequency, dysuria, hematuria.  Denies fever, chills, night sweats.  Take Tylenol with some relief of headache.  She had a recent spontaneous miscarriage 12/18, and repeat ultrasound on 12/31 that no longer showed GS. She has not had a menstrual cycle since then.       Past Medical History:  Diagnosis Date  . Abnormal Pap smear   . Medical history non-contributory     Patient Active Problem List   Diagnosis Date Noted  . History of cesarean delivery, currently pregnant 10/11/2016  . History of shoulder dystocia in prior pregnancy, currently pregnant in third trimester 10/11/2016  . History of placenta abruption 10/11/2016    Past Surgical History:  Procedure Laterality Date  . CESAREAN SECTION  01/02/2012   Procedure: CESAREAN SECTION;  Surgeon: Tilda BurrowJohn V Ferguson, MD;  Location: [facility];  Service: Gynecology;  Laterality: N/A;  Primary cesarean section of baby boy ay 0125 APGAR 8/9. NRFHT, Abruption=5%    OB History    Gravida Para Term Preterm AB Living   9 5 5  0 3 5   SAB TAB Ectopic Multiple Live Births   1 2 0 0 5       Home Medications    Prior to Admission medications   Not on File    Family History Family History  Problem Relation Age of Onset    . Anesthesia problems Neg Hx   . Other Neg Hx     Social History Social History   Tobacco Use  . Smoking status: Current Every Day Smoker    Packs/day: 0.25    Types: Cigarettes    Last attempt to quit: 05/12/2011    Years since quitting: 6.2  . Smokeless tobacco: Current User  Substance Use Topics  . Alcohol use: No  . Drug use: No     Allergies   Patient has no known allergies.   Review of Systems Review of Systems  Reason unable to perform ROS: See HPI as above.     Physical Exam Triage Vital Signs ED Triage Vitals [07/28/17 1029]  Enc Vitals Group     BP 109/68     Pulse Rate 72     Resp 16     Temp 98.2 F (36.8 C)     Temp Source Oral     SpO2 100 %     Weight      Height      Head Circumference      Peak Flow      Pain Score      Pain Loc      Pain Edu?      Excl. in GC?    No data found.  Updated Vital Signs BP 109/68 (BP Location: Right Arm)  Pulse 72   Temp 98.2 F (36.8 C) (Oral)   Resp 16   LMP 03/20/2017   SpO2 100%   Breastfeeding? No   Physical Exam  Constitutional: She is oriented to person, place, and time. She appears well-developed and well-nourished. No distress.  HENT:  Head: Normocephalic and atraumatic.  Eyes: Conjunctivae are normal. Pupils are equal, round, and reactive to light.  Cardiovascular: Normal rate, regular rhythm and normal heart sounds. Exam reveals no gallop and no friction rub.  No murmur heard. Pulmonary/Chest: Effort normal and breath sounds normal. She has no wheezes. She has no rales.  Abdominal: Soft. Bowel sounds are normal. She exhibits no mass. There is tenderness in the right lower quadrant, suprapubic area and left lower quadrant. There is no rebound, no guarding and no CVA tenderness.  Neurological: She is alert and oriented to person, place, and time.  Skin: Skin is warm and dry.  Psychiatric: She has a normal mood and affect. Her behavior is normal. Judgment normal.    UC Treatments /  Results  Labs (all labs ordered are listed, but only abnormal results are displayed) Labs Reviewed  HCG, QUANTITATIVE, PREGNANCY - Abnormal; Notable for the following components:      Result Value   hCG, Beta Chain, Quant, S 1,647 (*)    All other components within normal limits  POCT PREGNANCY, URINE - Abnormal; Notable for the following components:   Preg Test, Ur POSITIVE (*)    All other components within normal limits  POCT URINALYSIS DIP (DEVICE)    EKG  EKG Interpretation None       Radiology US Ob Comp Less 14 Wks  Result Date: 07/28/2017 CLINICAL DATA:  Abdominal pain. EXAM: OBSTETRIC <14 WK [place] AND TRANSVAGINAL OB US TECHNIQUE: Both transabdominal and transvaginal ultrasound examinations were performed for complete evaluation of the gestation as well as the maternal uterus, adnexal regions, and pelvic cul-de-sac. Transvaginal technique was performed to assess early pregnancy. COMPARISON:  None. FINDINGS: Intrauterine gestational sac: Single Yolk sac:  Not Visualized. Embryo:  Not Visualized. Cardiac Activity: Not Visualized. MSD: 2.3 mm   4 w   6 d Maternal uterus/adnexae: Subchorionic hemorrhage: Small size subchorionic hemorrhage noted. This measures 1.3 x 0.4 x 0.2 cm (volume = 0.05 cm^3) Right ovary: Small amount of paraovarian fluid noted. Ovary appears normal. Left ovary: Normal Other :None Free fluid:  Trace IMPRESSION: 1. Probable early intrauterine gestational sac, but no yolk sac, fetal pole, or cardiac activity yet visualized. Recommend follow-up quantitative B-HCG levels and follow-up US in 14 days to confirm and assess viability. This recommendation follows SRU consensus guidelines: Diagnostic Criteria for Nonviable Pregnancy Early in the First Trimester. Malva Limes Med 2013; 811:9147-82. 2. Subchorionic hemorrhage. Electronically Signed   By: Signa Kell M.D.   On: 07/28/2017 13:59   US Ob Transvaginal  Result Date: 07/28/2017 CLINICAL DATA:  Abdominal pain. EXAM:  OBSTETRIC <14 WK [place] AND TRANSVAGINAL OB US TECHNIQUE: Both transabdominal and transvaginal ultrasound examinations were performed for complete evaluation of the gestation as well as the maternal uterus, adnexal regions, and pelvic cul-de-sac. Transvaginal technique was performed to assess early pregnancy. COMPARISON:  None. FINDINGS: Intrauterine gestational sac: Single Yolk sac:  Not Visualized. Embryo:  Not Visualized. Cardiac Activity: Not Visualized. MSD: 2.3 mm   4 w   6 d Maternal uterus/adnexae: Subchorionic hemorrhage: Small size subchorionic hemorrhage noted. This measures 1.3 x 0.4 x 0.2 cm (volume = 0.05 cm^3) Right ovary: Small amount of paraovarian  fluid noted. Ovary appears normal. Left ovary: Normal Other :None Free fluid:  Trace IMPRESSION: 1. Probable early intrauterine gestational sac, but no yolk sac, fetal pole, or cardiac activity yet visualized. Recommend follow-up quantitative B-HCG levels and follow-up US in 14 days to confirm and assess viability. This recommendation follows SRU consensus guidelines: Diagnostic Criteria for Nonviable Pregnancy Early in the First Trimester. Malva Limes Med 2013; 161:0960-45. 2. Subchorionic hemorrhage. Electronically Signed   By: Signa Kell M.D.   On: 07/28/2017 13:59    Procedures Procedures (including critical care time)  Medications Ordered in UC Medications - No data to display   Initial Impression / Assessment and Plan / UC Course  I have reviewed the triage vital signs and the nursing notes.  Pertinent labs & imaging results that were available during my care of the patient were reviewed by me and considered in my medical decision making (see chart for details).    27 year old female with history of placenta abruption comes in for 4-day history of intermittent low abdominal cramping that radiates to the back.  She has also had nausea, vomiting, palpitations.  She recently had a miscarriage on 12/18, with repeat ultrasound on 12/31  without gestational sac.  She has not had a menstrual cycle since the miscarriage, but has had some spotting starting 4 days ago. She has tenderness to palpation of low abdomen without guarding or rebound. Urine pregnancy positive. Quantitative hCG in progress.  Patient discharged in stable condition to [facility] for further evaluation and management.  Patient expresses understanding and agrees to plan.  Final Clinical Impressions(s) / UC Diagnoses   Final diagnoses:  Positive urine pregnancy test  Pelvic pain  Spotting    ED Discharge Orders    None       Belinda Fisher, PA-C 07/28/17 1529

## 2017-07-28 NOTE — MAU Note (Signed)
Pt presents with c/o lower abdominal cramping and back pain.  Reports had miscarriage in December, didn't receive any f/u.  Went to Urgent care today for abd cramping & lower back pain and instructed to come to MAU for eval.  Pt reports urine and blood specimens obtained.   Denies current VB.  Reports no menstrual cycle since miscarriage.

## 2017-07-28 NOTE — Progress Notes (Signed)
Wet prep & GC/Chlamydia obtained by Harlon FlorJ. Wenzel, PA and sent to lab.

## 2017-07-28 NOTE — MAU Provider Note (Signed)
History     CSN: [id number]  Arrival date and time: 07/28/17 1149   First Provider Initiated Contact with Patient 07/28/17 1204      Chief Complaint  Patient presents with  . Abdominal Pain  . Back Pain   HPI [name] is a 27 y.o. [phone] at Unknown who presents to MAU today with complaint of lower abdominal cramping. She had early SAB in December. She did not follow-up after the miscarriage. Last hCG was 5367 on 06/12/17. She is not on birth control and does not use condoms. She has not had a period since the SAB. She states spotting noted a few days ago, none today. She had +UPT at Urgent Care and was sent for further evaluation. She states mild lower abdominal cramping off and on. She has not taken anything for pain.   OB History    Gravida Para Term Preterm AB Living   9 5 5  0 3 5   SAB TAB Ectopic Multiple Live Births   1 2 0 0 5      Past Medical History:  Diagnosis Date  . Abnormal Pap smear   . Medical history non-contributory     Past Surgical History:  Procedure Laterality Date  . CESAREAN SECTION  01/02/2012   Procedure: CESAREAN SECTION;  Surgeon: Tilda BurrowJohn V Ferguson, MD;  Location: [facility];  Service: Gynecology;  Laterality: N/A;  Primary cesarean section of baby boy ay 0125 APGAR 8/9. NRFHT, Abruption=5%    Family History  Problem Relation Age of Onset  . Anesthesia problems Neg Hx   . Other Neg Hx     Social History   Tobacco Use  . Smoking status: Current Every Day Smoker    Packs/day: 0.25    Types: Cigarettes    Last attempt to quit: 05/12/2011    Years since quitting: 6.2  . Smokeless tobacco: Current User  Substance Use Topics  . Alcohol use: No  . Drug use: No    Allergies: No Known Allergies  No medications prior to admission.    Review of Systems  Constitutional: Negative for fever.  Gastrointestinal: Positive for abdominal pain. Negative for constipation, diarrhea, nausea and vomiting.  Genitourinary: Negative for  dysuria, frequency, urgency, vaginal bleeding and vaginal discharge.   Physical Exam   Blood pressure 114/70, pulse 78, temperature 98 F (36.7 C), temperature source Oral, resp. rate 20, height 5\' 3"  (1.6 m), weight 155 lb 4 oz (70.4 kg), last menstrual period 03/20/2017, SpO2 99 %, unknown if currently breastfeeding.  Physical Exam  Nursing note and vitals reviewed. Constitutional: She is oriented to person, place, and time. She appears well-developed and well-nourished. No distress.  HENT:  Head: Normocephalic and atraumatic.  Cardiovascular: Normal rate.  Respiratory: Effort normal.  GI: Soft. She exhibits no distension and no mass. There is no tenderness. There is no rebound and no guarding.  Genitourinary: Uterus is not enlarged and not tender. Cervix exhibits no motion tenderness, no discharge and no friability. Right adnexum displays no mass and no tenderness. Left adnexum displays no mass and no tenderness. No bleeding in the vagina. Vaginal discharge (small white) found.  Neurological: She is alert and oriented to person, place, and time.  Skin: Skin is warm and dry. No erythema.  Psychiatric: She has a normal mood and affect.  Dilation: Closed Effacement (%): Thick Cervical Position: Posterior Exam by:: Vonzella NippleJulie Ligia Duguay, PA-C  Results for Monique FraySMITH, Monique S (MRN [id number]) as of 07/28/2017 14:40  Ref. Range  07/28/2017 11:32  HCG, Beta Chain, Quant, S Latest Ref Range: <5 mIU/mL 1,647 (H)    Results for orders placed or performed during the hospital encounter of 07/28/17 (from the past 24 hour(s))  CBC with Differential/Platelet     Status: Abnormal   Collection Time: 07/28/17 12:16 PM  Result Value Ref Range   WBC 8.2 4.0 - 10.5 K/uL   RBC 4.16 3.87 - 5.11 MIL/uL   Hemoglobin 11.7 (L) 12.0 - 15.0 g/dL   HCT 16.1 (L) 09.6 - 04.5 %   MCV 83.9 78.0 - 100.0 fL   MCH 28.1 26.0 - 34.0 pg   MCHC 33.5 30.0 - 36.0 g/dL   RDW 40.9 81.1 - 91.4 %   Platelets 287 150 - 400 K/uL    Neutrophils Relative % 59 %   Neutro Abs 4.8 1.7 - 7.7 K/uL   Lymphocytes Relative 36 %   Lymphs Abs 2.9 0.7 - 4.0 K/uL   Monocytes Relative 4 %   Monocytes Absolute 0.3 0.1 - 1.0 K/uL   Eosinophils Relative 2 %   Eosinophils Absolute 0.2 0.0 - 0.7 K/uL   Basophils Relative 1 %   Basophils Absolute 0.0 0.0 - 0.1 K/uL  Wet prep, genital     Status: Abnormal   Collection Time: 07/28/17  2:17 PM  Result Value Ref Range   Yeast Wet Prep HPF POC NONE SEEN NONE SEEN   Trich, Wet Prep NONE SEEN NONE SEEN   Clue Cells Wet Prep HPF POC NONE SEEN NONE SEEN   WBC, Wet Prep HPF POC FEW (A) NONE SEEN   Sperm NONE SEEN    US Ob Comp Less 14 Wks  Result Date: 07/28/2017 CLINICAL DATA:  Abdominal pain. EXAM: OBSTETRIC <14 WK [place] AND TRANSVAGINAL OB US TECHNIQUE: Both transabdominal and transvaginal ultrasound examinations were performed for complete evaluation of the gestation as well as the maternal uterus, adnexal regions, and pelvic cul-de-sac. Transvaginal technique was performed to assess early pregnancy. COMPARISON:  None. FINDINGS: Intrauterine gestational sac: Single Yolk sac:  Not Visualized. Embryo:  Not Visualized. Cardiac Activity: Not Visualized. MSD: 2.3 mm   4 w   6 d Maternal uterus/adnexae: Subchorionic hemorrhage: Small size subchorionic hemorrhage noted. This measures 1.3 x 0.4 x 0.2 cm (volume = 0.05 cm^3) Right ovary: Small amount of paraovarian fluid noted. Ovary appears normal. Left ovary: Normal Other :None Free fluid:  Trace IMPRESSION: 1. Probable early intrauterine gestational sac, but no yolk sac, fetal pole, or cardiac activity yet visualized. Recommend follow-up quantitative B-HCG levels and follow-up US in 14 days to confirm and assess viability. This recommendation follows SRU consensus guidelines: Diagnostic Criteria for Nonviable Pregnancy Early in the First Trimester. Malva Limes Med 2013; 782:9562-13. 2. Subchorionic hemorrhage. Electronically Signed   By: Signa Kell M.D.    On: 07/28/2017 13:59   US Ob Transvaginal  Result Date: 07/28/2017 CLINICAL DATA:  Abdominal pain. EXAM: OBSTETRIC <14 WK [place] AND TRANSVAGINAL OB US TECHNIQUE: Both transabdominal and transvaginal ultrasound examinations were performed for complete evaluation of the gestation as well as the maternal uterus, adnexal regions, and pelvic cul-de-sac. Transvaginal technique was performed to assess early pregnancy. COMPARISON:  None. FINDINGS: Intrauterine gestational sac: Single Yolk sac:  Not Visualized. Embryo:  Not Visualized. Cardiac Activity: Not Visualized. MSD: 2.3 mm   4 w   6 d Maternal uterus/adnexae: Subchorionic hemorrhage: Small size subchorionic hemorrhage noted. This measures 1.3 x 0.4 x 0.2 cm (volume = 0.05 cm^3) Right ovary: Small  amount of paraovarian fluid noted. Ovary appears normal. Left ovary: Normal Other :None Free fluid:  Trace IMPRESSION: 1. Probable early intrauterine gestational sac, but no yolk sac, fetal pole, or cardiac activity yet visualized. Recommend follow-up quantitative B-HCG levels and follow-up US in 14 days to confirm and assess viability. This recommendation follows SRU consensus guidelines: Diagnostic Criteria for Nonviable Pregnancy Early in the First Trimester. Malva Limes Med 2013; 161:0960-45. 2. Subchorionic hemorrhage. Electronically Signed   By: Signa Kell M.D.   On: 07/28/2017 13:59    MAU Course  Procedures None  MDM Note from Urgent Care reviewed Called Urgent Care to confirm hCG was run stat so no need to repeat here  Assessment and Plan  A: Pregnancy of unknown location  Small subchorionic hemorrhage   P:  Discharge home Tylenol PRN for pain  Bleeding/ectopic precautions discussed Patient advised to follow-up in MAU on Sunday for repeat hCG or sooner as needed or if her condition were to change or worsen  Vonzella Nipple, PA-C 07/28/2017, 2:40 PM

## 2017-07-28 NOTE — Discharge Instructions (Signed)

## 2017-07-28 NOTE — Discharge Instructions (Signed)
Given history of recent miscarriage, with low abdominal pain, spotting, and positive urine pregnancy test, [facility] A blood pregnancy test was drawn before you left to help with further evaluation.

## 2017-07-28 NOTE — ED Triage Notes (Signed)
PT C/O: intermittent abd pain onset 4 days that radiates to the back associated w/HA, n/v  Reports miscarriage back in 12/18  Not sure of LMP   DENIES: urinary sx, vag d/c, fevers  TAKING MEDS: acetaminophen   A&O x4... NAD... Ambulatory

## 2017-07-29 LAB — HIV ANTIBODY (ROUTINE TESTING W REFLEX): HIV Screen 4th Generation wRfx: NONREACTIVE

## 2017-07-29 LAB — RPR: RPR Ser Ql: NONREACTIVE

## 2017-07-30 ENCOUNTER — Inpatient Hospital Stay (HOSPITAL_COMMUNITY)
Admission: AD | Admit: 2017-07-30 | Discharge: 2017-07-30 | Disposition: A | Discharge status: Home / Self Care | Payer: Medicaid Other | Source: Ambulatory Visit | Attending: Family Medicine | Admitting: Family Medicine

## 2017-07-30 DIAGNOSIS — O3680X0 Pregnancy with inconclusive fetal viability, not applicable or unspecified: Secondary | ICD-10-CM

## 2017-07-30 LAB — HCG, QUANTITATIVE, PREGNANCY: hCG, Beta Chain, Quant, S: 3407 m[IU]/mL — ABNORMAL HIGH (ref <5)

## 2017-07-30 NOTE — MAU Provider Note (Signed)
Monique Harris  is a 27 y.o. [id number] at Unknown who presents to MAU today for follow-up quant hCG after 48 hours. The patient was seen in MAU on 07/28/17 and had quant hCG of 1647 and US showed IUGS but no YS or FP. She denies pain, vaginal bleeding or fever today.   OB History  Gravida Para Term Preterm AB Living  9 5 5  0 3 5  SAB TAB Ectopic Multiple Live Births  1 2 0 0 5    # Outcome Date GA Lbr Len/2nd Weight Sex Delivery Anes PTL Lv  9 Current           8 SAB 06/12/17 5266w4d         7 Term 11/29/16 6245w0d 14:42 / 01:16 7 lb 0.5 oz (3.189 kg) M VBAC EPI  LIV  6 Term 01/02/12 6269w1d  5 lb 12.1 oz (2.611 kg) M CS-LTranv EPI  LIV  5 Term 08/28/09 3945w0d  7 lb (3.175 kg) F Vag-Spont EPI  LIV  4 Term 07/15/07 1545w0d  6 lb 5 oz (2.863 kg) M Vag-Spont EPI  LIV  3 Term 08/27/06 4114w0d  6 lb (2.722 kg) F Vag-Spont EPI  LIV  2 TAB           1 TAB               Past Medical History:  Diagnosis Date  . Abnormal Pap smear   . Medical history non-contributory     ROS: no VB no pain  BP (!) 98/59 (BP Location: Right Arm)   Pulse 90   Temp 98.2 F (36.8 C)   Resp 16   LMP 03/20/2017   CONSTITUTIONAL: Well-developed, well-nourished female in no acute distress.  MUSCULOSKELETAL: Normal range of motion.  CARDIOVASCULAR: Regular heart rate RESPIRATORY: Normal effort NEUROLOGICAL: Alert and oriented to person, place, and time.  SKIN: Not diaphoretic. No erythema. No pallor. PSYCH: Normal mood and affect. Normal behavior. Normal judgment and thought content.  Results for orders placed or performed during the hospital encounter of 07/30/17 (from the past 24 hour(s))  hCG, quantitative, pregnancy     Status: Abnormal   Collection Time: 07/30/17 10:33 AM  Result Value Ref Range   hCG, Beta Chain, Quant, S 3,407 (H) <5 mIU/mL    MDM: Appropriate rise in quant, likely early nml pregnancy but will follow quant again in 48 hrs.  A: Pregnancy of unknown location  P: Discharge  home First trimester/ectopic precautions discussed Patient will return for follow-up quant HCG in [facility] on 05/31/18 @0830  Patient may return to MAU as needed or if her condition were to change or worsen   Donette LarryBhambri, Monique Harris, [place] 07/30/2017 12:02 PM

## 2017-07-30 NOTE — MAU Note (Signed)
Patient here for f/u HCG, denies vaginal bleeding, no pain  

## 2017-07-31 LAB — GC/CHLAMYDIA PROBE AMP (~~LOC~~) NOT AT ARMC
Chlamydia: NEGATIVE
Neisseria Gonorrhea: NEGATIVE

## 2017-08-01 ENCOUNTER — Ambulatory Visit: Payer: Self-pay | Admitting: General Practice

## 2017-08-01 DIAGNOSIS — O3680X0 Pregnancy with inconclusive fetal viability, not applicable or unspecified: Secondary | ICD-10-CM

## 2017-08-01 LAB — HCG, QUANTITATIVE, PREGNANCY: hCG, Beta Chain, Quant, S: 5860 m[IU]/mL — ABNORMAL HIGH (ref <5)

## 2017-08-01 NOTE — Progress Notes (Signed)
Patient here for stat bhcg today. Patient denies pain or bleeding. Discussed with patient we are monitoring her bhcg levels today and asked she wait in lobby for results/ updated plan of care. Patient verbalized understanding to all & had no questions at this time.   Reviewed results with Dr Doroteo GlassmanPhelps who finds appropriate rise in bhcg levels- patient should have an ultrasound in 1 week. Scheduled for 3/1 @ 9am.  Informed patient of results & ultrasound appt. Ectopic precautions reviewed. Patient verbalized understanding to all & had no questions

## 2017-08-01 NOTE — Progress Notes (Signed)
Chart reviewed for nurse visit. Agree with plan of care.   Pincus Largehelps, Jazma Y, DO 08/01/2017 1:06 PM

## 2017-08-11 ENCOUNTER — Encounter: Payer: Self-pay | Admitting: Family Medicine

## 2017-08-11 ENCOUNTER — Ambulatory Visit (HOSPITAL_COMMUNITY)
Admission: RE | Admit: 2017-08-11 | Discharge: 2017-08-11 | Disposition: A | Discharge status: Home / Self Care | Payer: Medicaid Other | Source: Ambulatory Visit | Attending: Obstetrics and Gynecology | Admitting: Obstetrics and Gynecology

## 2017-08-11 ENCOUNTER — Ambulatory Visit: Payer: Self-pay | Admitting: General Practice

## 2017-08-11 DIAGNOSIS — Z3A01 Less than 8 weeks gestation of pregnancy: Secondary | ICD-10-CM | POA: Diagnosis not present

## 2017-08-11 DIAGNOSIS — Z3491 Encounter for supervision of normal pregnancy, unspecified, first trimester: Secondary | ICD-10-CM | POA: Insufficient documentation

## 2017-08-11 DIAGNOSIS — O3680X0 Pregnancy with inconclusive fetal viability, not applicable or unspecified: Secondary | ICD-10-CM

## 2017-08-11 DIAGNOSIS — Z712 Person consulting for explanation of examination or test findings: Secondary | ICD-10-CM

## 2017-08-11 NOTE — Progress Notes (Signed)
Patient here for viability results today. Reviewed results with Dr Marice Potterove who finds living IUP. Patient should begin prenatal care. Informed patient of results, dating & provided pictures. Recommended she begin prenatal care. Patient verbalized understanding to all & had no questions

## 2017-08-14 NOTE — Progress Notes (Signed)
I have reviewed this chart and agree with the RN/CMA assessment and management.     Nicholaus BloomMyra Aleisha Paone, MD

## 2017-09-14 ENCOUNTER — Encounter: Payer: Self-pay | Admitting: Family Medicine

## 2017-09-14 ENCOUNTER — Ambulatory Visit (INDEPENDENT_AMBULATORY_CARE_PROVIDER_SITE_OTHER): Payer: Medicaid Other | Admitting: Family Medicine

## 2017-09-14 ENCOUNTER — Encounter: Payer: Self-pay | Admitting: General Practice

## 2017-09-14 VITALS — BP 100/69 | HR 83 | Wt 157.0 lb

## 2017-09-14 DIAGNOSIS — O34219 Maternal care for unspecified type scar from previous cesarean delivery: Secondary | ICD-10-CM

## 2017-09-14 DIAGNOSIS — Z641 Problems related to multiparity: Secondary | ICD-10-CM | POA: Insufficient documentation

## 2017-09-14 DIAGNOSIS — Z8759 Personal history of other complications of pregnancy, childbirth and the puerperium: Secondary | ICD-10-CM

## 2017-09-14 DIAGNOSIS — O0990 Supervision of high risk pregnancy, unspecified, unspecified trimester: Secondary | ICD-10-CM | POA: Insufficient documentation

## 2017-09-14 DIAGNOSIS — O09293 Supervision of pregnancy with other poor reproductive or obstetric history, third trimester: Secondary | ICD-10-CM

## 2017-09-14 DIAGNOSIS — O09899 Supervision of other high risk pregnancies, unspecified trimester: Secondary | ICD-10-CM | POA: Insufficient documentation

## 2017-09-14 DIAGNOSIS — O0991 Supervision of high risk pregnancy, unspecified, first trimester: Secondary | ICD-10-CM

## 2017-09-14 DIAGNOSIS — O09291 Supervision of pregnancy with other poor reproductive or obstetric history, first trimester: Secondary | ICD-10-CM

## 2017-09-14 LAB — POCT URINALYSIS DIP (DEVICE)
BILIRUBIN URINE: NEGATIVE
Glucose, UA: NEGATIVE mg/dL
Hgb urine dipstick: NEGATIVE
Leukocytes, UA: NEGATIVE
Nitrite: NEGATIVE
PH: 6 (ref 5.0–8.0)
Protein, ur: NEGATIVE mg/dL
Urobilinogen, UA: 0.2 mg/dL (ref 0.0–1.0)

## 2017-09-14 NOTE — Progress Notes (Signed)
Bedside ultrasound done for FHR. FHR 173 bpm

## 2017-09-14 NOTE — Progress Notes (Signed)
    Subjective:    Monique Harris is a [id number] 6990w5d being seen today for her first obstetrical visit.  Her obstetrical history is significant for grandmultiparity. Patient does not intend to breast feed. Pregnancy history fully reviewed.  Patient reports no complaints.  Vitals:   09/14/17 0917  BP: 100/69  Pulse: 83  Weight: 157 lb (71.2 kg)    HISTORY: OB History  Gravida Para Term Preterm AB Living  9 5 5  0 3 5  SAB TAB Ectopic Multiple Live Births  1 2 0 0 5    # Outcome Date GA Lbr Len/2nd Weight Sex Delivery Anes PTL Lv  9 Current           8 SAB 06/12/17 3455w4d         7 Term 11/29/16 1025w0d 14:42 / 01:16 7 lb 0.5 oz (3.189 kg) M VBAC EPI  LIV  6 Term 01/02/12 6716w1d  5 lb 12.1 oz (2.611 kg) M CS-LTranv EPI  LIV  5 Term 08/28/09 1025w0d  7 lb (3.175 kg) F Vag-Spont EPI  LIV  4 Term 07/15/07 10625w0d  6 lb 5 oz (2.863 kg) M Vag-Spont EPI  LIV  3 Term 08/27/06 3353w0d  6 lb (2.722 kg) F Vag-Spont EPI  LIV  2 TAB           1 TAB            Past Medical History:  Diagnosis Date  . Abnormal Pap smear   . Medical history non-contributory    Past Surgical History:  Procedure Laterality Date  . CESAREAN SECTION  01/02/2012   Procedure: CESAREAN SECTION;  Surgeon: Tilda BurrowJohn V Ferguson, MD;  Location: [facility];  Service: Gynecology;  Laterality: N/A;  Primary cesarean section of baby boy ay 0125 APGAR 8/9. NRFHT, Abruption=5%   Family History  Problem Relation Age of Onset  . Anesthesia problems Neg Hx   . Other Neg Hx      Exam    Uterus:     Pelvic Exam:   System: Breast:  normal appearance, no masses or tenderness   Skin: normal coloration and turgor, no rashes    Neurologic: oriented   Extremities: normal strength, tone, and muscle mass   HEENT extra ocular movement intact and sclera clear, anicteric   Mouth/Teeth mucous membranes moist, pharynx normal without lesions and dental hygiene good   Neck supple   Cardiovascular: regular rate and rhythm, no murmurs or gallops    Respiratory:  appears well, vitals normal, no respiratory distress, acyanotic, normal RR, ear and throat exam is normal, neck free of mass or lymphadenopathy, chest clear, no wheezing, crepitations, rhonchi, normal symmetric air entry   Abdomen: soft, non-tender; bowel sounds normal; no masses,  no organomegaly      Assessment/Plan:    Pregnancy: [id number] 1. History of cesarean delivery, currently pregnant Desires TOLAC s/p successful VBAC x 1, SVD x 3 prior  2. Supervision of high risk pregnancy, antepartum New OB labs and Panorama today - Culture, OB Urine - Obstetric Panel, Including HIV - SMN1 COPY NUMBER ANALYSIS (SMA Carrier Screen) - Genetic Screening  3. History of placenta abruption Reason for PLTCS  4. History of shoulder dystocia in prior pregnancy, currently pregnant in third trimester   5. Grand multiparity At risk of PPH  Return in 1 month (on 10/12/2017).  Monique Harris 09/14/2017

## 2017-09-14 NOTE — Patient Instructions (Signed)

## 2017-09-15 ENCOUNTER — Encounter: Payer: Self-pay | Admitting: *Deleted

## 2017-09-16 LAB — CULTURE, OB URINE

## 2017-09-16 LAB — URINE CULTURE, OB REFLEX

## 2017-09-21 LAB — OBSTETRIC PANEL, INCLUDING HIV
Antibody Screen: NEGATIVE
BASOS ABS: 0 10*3/uL (ref 0.0–0.2)
Basos: 0 %
EOS (ABSOLUTE): 0.1 10*3/uL (ref 0.0–0.4)
Eos: 1 %
HEP B S AG: NEGATIVE
HIV SCREEN 4TH GENERATION: NONREACTIVE
Hematocrit: 33.1 % — ABNORMAL LOW (ref 34.0–46.6)
Hemoglobin: 11.1 g/dL (ref 11.1–15.9)
Immature Grans (Abs): 0 10*3/uL (ref 0.0–0.1)
Immature Granulocytes: 1 %
LYMPHS ABS: 2.2 10*3/uL (ref 0.7–3.1)
Lymphs: 27 %
MCH: 27.8 pg (ref 26.6–33.0)
MCHC: 33.5 g/dL (ref 31.5–35.7)
MCV: 83 fL (ref 79–97)
Monocytes Absolute: 0.5 10*3/uL (ref 0.1–0.9)
Monocytes: 6 %
NEUTROS ABS: 5.3 10*3/uL (ref 1.4–7.0)
Neutrophils: 65 %
PLATELETS: 304 10*3/uL (ref 150–379)
RBC: 4 x10E6/uL (ref 3.77–5.28)
RDW: 13.5 % (ref 12.3–15.4)
RPR: NONREACTIVE
Rh Factor: POSITIVE
Rubella Antibodies, IGG: 16.2 index (ref >0.99)
WBC: 8.2 10*3/uL (ref 3.4–10.8)

## 2017-09-21 LAB — SMN1 COPY NUMBER ANALYSIS (SMA CARRIER SCREENING)

## 2017-09-27 ENCOUNTER — Encounter: Payer: Self-pay | Admitting: *Deleted

## 2017-09-28 ENCOUNTER — Encounter: Payer: Self-pay | Admitting: *Deleted

## 2017-10-05 ENCOUNTER — Telehealth: Payer: Self-pay

## 2017-10-05 NOTE — Telephone Encounter (Signed)
Pt called the office for panorama results. Pt informed that she is having a girl and panorama results are normal. Pt voiced understanding.

## 2017-10-11 ENCOUNTER — Encounter: Payer: Self-pay | Admitting: Student

## 2017-10-12 ENCOUNTER — Other Ambulatory Visit (HOSPITAL_COMMUNITY)
Admission: RE | Admit: 2017-10-12 | Discharge: 2017-10-12 | Disposition: A | Discharge status: Home / Self Care | Payer: Medicaid Other | Source: Ambulatory Visit | Attending: Student | Admitting: Student

## 2017-10-12 ENCOUNTER — Ambulatory Visit (INDEPENDENT_AMBULATORY_CARE_PROVIDER_SITE_OTHER): Payer: Medicaid Other | Admitting: Student

## 2017-10-12 VITALS — BP 103/58 | HR 81 | Wt 155.6 lb

## 2017-10-12 DIAGNOSIS — N870 Mild cervical dysplasia: Secondary | ICD-10-CM | POA: Diagnosis not present

## 2017-10-12 DIAGNOSIS — R12 Heartburn: Secondary | ICD-10-CM

## 2017-10-12 DIAGNOSIS — O26892 Other specified pregnancy related conditions, second trimester: Secondary | ICD-10-CM

## 2017-10-12 DIAGNOSIS — O0990 Supervision of high risk pregnancy, unspecified, unspecified trimester: Secondary | ICD-10-CM | POA: Diagnosis present

## 2017-10-12 DIAGNOSIS — O0992 Supervision of high risk pregnancy, unspecified, second trimester: Secondary | ICD-10-CM

## 2017-10-12 MED ORDER — RANITIDINE HCL 150 MG PO TABS: 150.0000 mg | ORAL_TABLET | Freq: Two times a day (BID) | ORAL | 0 refills | Status: DC

## 2017-10-12 NOTE — Progress Notes (Signed)
   PRENATAL VISIT NOTE  Subjective:  Monique Harris is a 27 y.o. [phone] at [redacted]w[redacted]d being seen today for ongoing prenatal care.  She is currently monitored for the following issues for this high-risk pregnancy and has History of cesarean delivery, currently pregnant; History of shoulder dystocia in prior pregnancy, currently pregnant in third trimester; History of placenta abruption; Supervision of high risk pregnancy, antepartum; Short interval between pregnancies affecting pregnancy, antepartum; and Grand multiparity on their problem list.  Patient reports no complaints.  Contractions: Not present. Vag. Bleeding: None.  Movement: Absent. Denies leaking of fluid.   The following portions of the patient's history were reviewed and updated as appropriate: allergies, current medications, past family history, past medical history, past social history, past surgical history and problem list. Problem list updated.  Objective:   Vitals:   10/12/17 0922  BP: (!) 103/58  Pulse: 81  Weight: 155 lb 9.6 oz (70.6 kg)    Fetal Status: Fetal Heart Rate (bpm): 156   Movement: Absent     General:  Alert, oriented and cooperative. Patient is in no acute distress.  Skin: Skin is warm and dry. No rash noted.   Cardiovascular: Normal heart rate noted  Respiratory: Normal respiratory effort, no problems with respiration noted  Abdomen: Soft, gravid, appropriate for gestational age.  Pain/Pressure: Absent     Pelvic: Cervical exam deferred        Pap smear collected. Cervix multiparous, pink & smooth, visually closed  Extremities: Normal range of motion.  Edema: None  Mental Status: Normal mood and affect. Normal behavior. Normal judgment and thought content.   Assessment and Plan:  Pregnancy: [id number] at [redacted]w[redacted]d  1. Supervision of high risk pregnancy, antepartum -Discussed results of panorama & reviewed AFP. Pt declines AFP.  - Cytology - PAP - [place] MFM OB DETAIL +14 WK; Future  2. Heartburn during  pregnancy in second trimester  - ranitidine (ZANTAC) 150 MG tablet; Take 1 tablet (150 mg total) by mouth 2 (two) times daily.  Dispense: 60 tablet; Refill: 0  Preterm labor symptoms and general obstetric precautions including but not limited to vaginal bleeding, contractions, leaking of fluid and fetal movement were reviewed in detail with the patient. Please refer to After Visit Summary for other counseling recommendations.  Return in about 1 month (around 11/09/2017) for Routine OB.  Future Appointments  Date Time Provider Department Center  11/15/2017 10:55 AM Judeth Horn, NP [facility]  11/16/2017  8:30 AM [facility] [place] 1 [facility]    Judeth Horn, NP

## 2017-10-12 NOTE — Patient Instructions (Addendum)
Second Trimester of Pregnancy The second trimester is from week 14 through week 27 (months 4 through 6). The second trimester is often a time when you feel your best. Your body has adjusted to being pregnant, and you begin to feel better physically. Usually, morning sickness has lessened or quit completely, you may have more energy, and you may have an increase in appetite. The second trimester is also a time when the fetus is growing rapidly. At the end of the sixth month, the fetus is about 9 inches long and weighs about 1 pounds. You will likely begin to feel the baby move (quickening) between 16 and 20 weeks of pregnancy. Body changes during your second trimester Your body continues to go through many changes during your second trimester. The changes vary from woman to woman.  Your weight will continue to increase. You will notice your lower abdomen bulging out.  You may begin to get stretch marks on your hips, abdomen, and breasts.  You may develop headaches that can be relieved by medicines. The medicines should be approved by your health care provider.  You may urinate more often because the fetus is pressing on your bladder.  You may develop or continue to have heartburn as a result of your pregnancy.  You may develop constipation because certain hormones are causing the muscles that push waste through your intestines to slow down.  You may develop hemorrhoids or swollen, bulging veins (varicose veins).  You may have back pain. This is caused by: ? Weight gain. ? Pregnancy hormones that are relaxing the joints in your pelvis. ? A shift in weight and the muscles that support your balance.  Your breasts will continue to grow and they will continue to become tender.  Your gums may bleed and may be sensitive to brushing and flossing.  Dark spots or blotches (chloasma, mask of pregnancy) may develop on your face. This will likely fade after the baby is born.  A dark line from your  belly button to the pubic area (linea nigra) may appear. This will likely fade after the baby is born.  You may have changes in your hair. These can include thickening of your hair, rapid growth, and changes in texture. Some women also have hair loss during or after pregnancy, or hair that feels dry or thin. Your hair will most likely return to normal after your baby is born.  What to expect at prenatal visits During a routine prenatal visit:  You will be weighed to make sure you and the fetus are growing normally.  Your blood pressure will be taken.  Your abdomen will be measured to track your baby's growth.  The fetal heartbeat will be listened to.  Any test results from the previous visit will be discussed.  Your health care provider may ask you:  How you are feeling.  If you are feeling the baby move.  If you have had any abnormal symptoms, such as leaking fluid, bleeding, severe headaches, or abdominal cramping.  If you are using any tobacco products, including cigarettes, chewing tobacco, and electronic cigarettes.  If you have any questions.  Other tests that may be performed during your second trimester include:  Blood tests that check for: ? Low iron levels (anemia). ? High blood sugar that affects pregnant women (gestational diabetes) between 24 and 28 weeks. ? Rh antibodies. This is to check for a protein on red blood cells (Rh factor).  Urine tests to check for infections, diabetes, or   protein in the urine.  An ultrasound to confirm the proper growth and development of the baby.  An amniocentesis to check for possible genetic problems.  Fetal screens for spina bifida and Down syndrome.  HIV (human immunodeficiency virus) testing. Routine prenatal testing includes screening for HIV, unless you choose not to have this test.  Follow these instructions at home: Medicines  Follow your health care provider's instructions regarding medicine use. Specific  medicines may be either safe or unsafe to take during pregnancy.  Take a prenatal vitamin that contains at least 600 micrograms (mcg) of folic acid.  If you develop constipation, try taking a stool softener if your health care provider approves. Eating and drinking  Eat a balanced diet that includes fresh fruits and vegetables, whole grains, good sources of protein such as meat, eggs, or tofu, and low-fat dairy. Your health care provider will help you determine the amount of weight gain that is right for you.  Avoid raw meat and uncooked cheese. These carry germs that can cause birth defects in the baby.  If you have low calcium intake from food, talk to your health care provider about whether you should take a daily calcium supplement.  Limit foods that are high in fat and processed sugars, such as fried and sweet foods.  To prevent constipation: ? Drink enough fluid to keep your urine clear or pale yellow. ? Eat foods that are high in fiber, such as fresh fruits and vegetables, whole grains, and beans. Activity  Exercise only as directed by your health care provider. Most women can continue their usual exercise routine during pregnancy. Try to exercise for 30 minutes at least 5 days a week. Stop exercising if you experience uterine contractions.  Avoid heavy lifting, wear low heel shoes, and practice good posture.  A sexual relationship may be continued unless your health care provider directs you otherwise. Relieving pain and discomfort  Wear a good support bra to prevent discomfort from breast tenderness.  Take warm sitz baths to soothe any pain or discomfort caused by hemorrhoids. Use hemorrhoid cream if your health care provider approves.  Rest with your legs elevated if you have leg cramps or low back pain.  If you develop varicose veins, wear support hose. Elevate your feet for 15 minutes, 3-4 times a day. Limit salt in your diet. Prenatal Care  Write down your questions.  Take them to your prenatal visits.  Keep all your prenatal visits as told by your health care provider. This is important. Safety  Wear your seat belt at all times when driving.  Make a list of emergency phone numbers, including numbers for family, friends, the hospital, and police and fire departments. General instructions  Ask your health care provider for a referral to a local prenatal education class. Begin classes no later than the beginning of month 6 of your pregnancy.  Ask for help if you have counseling or nutritional needs during pregnancy. Your health care provider can offer advice or refer you to specialists for help with various needs.  Do not use hot tubs, steam rooms, or saunas.  Do not douche or use tampons or scented sanitary pads.  Do not cross your legs for long periods of time.  Avoid cat litter boxes and soil used by cats. These carry germs that can cause birth defects in the baby and possibly loss of the fetus by miscarriage or stillbirth.  Avoid all smoking, herbs, alcohol, and unprescribed drugs. Chemicals in these products can   can affect the formation and growth of the baby.  Do not use any products that contain nicotine or tobacco, such as cigarettes and e-cigarettes. If you need help quitting, ask your health care provider.  Visit your dentist if you have not gone yet during your pregnancy. Use a soft toothbrush to brush your teeth and be gentle when you floss. Contact a health care provider if:  You have dizziness.  You have mild pelvic cramps, pelvic pressure, or nagging pain in the abdominal area.  You have persistent nausea, vomiting, or diarrhea.  You have a bad smelling vaginal discharge.  You have pain when you urinate. Get help right away if:  You have a fever.  You are leaking fluid from your vagina.  You have spotting or bleeding from your vagina.  You have severe abdominal cramping or pain.  You have rapid weight gain or weight  loss.  You have shortness of breath with chest pain.  You notice sudden or extreme swelling of your face, hands, ankles, feet, or legs.  You have not felt your baby move in over an hour.  You have severe headaches that do not go away when you take medicine.  You have vision changes. Summary  The second trimester is from week 14 through week 27 (months 4 through 6). It is also a time when the fetus is growing rapidly.  Your body goes through many changes during pregnancy. The changes vary from woman to woman.  Avoid all smoking, herbs, alcohol, and unprescribed drugs. These chemicals affect the formation and growth your baby.  Do not use any tobacco products, such as cigarettes, chewing tobacco, and e-cigarettes. If you need help quitting, ask your health care provider.  Contact your health care provider if you have any questions. Keep all prenatal visits as told by your health care provider. This is important. This information is not intended to replace advice given to you by your health care provider. Make sure you discuss any questions you have with your health care provider. Document Released: 05/24/2001 Document Revised: 07/05/2016 Document Reviewed: 07/05/2016 Elsevier Interactive Patient Education  2018 [employer]   Contraception Choices Contraception, also called birth control, refers to methods or devices that prevent pregnancy. Hormonal methods Contraceptive implant A contraceptive implant is a thin, plastic tube that contains a hormone. It is inserted into the upper part of the arm. It can remain in place for up to 3 years. Progestin-only injections Progestin-only injections are injections of progestin, a synthetic form of the hormone progesterone. They are given every 3 months by a health care provider. Birth control pills Birth control pills are pills that contain hormones that prevent pregnancy. They must be taken once a day, preferably at the same time each  day. Birth control patch The birth control patch contains hormones that prevent pregnancy. It is placed on the skin and must be changed once a week for three weeks and removed on the fourth week. A prescription is needed to use this method of contraception. Vaginal ring A vaginal ring contains hormones that prevent pregnancy. It is placed in the vagina for three weeks and removed on the fourth week. After that, the process is repeated with a new ring. A prescription is needed to use this method of contraception. Emergency contraceptive Emergency contraceptives prevent pregnancy after unprotected sex. They come in pill form and can be taken up to 5 days after sex. They work best the sooner they are taken after having sex. Most emergency   contraceptives are available without a prescription. This method should not be used as your only form of birth control. Barrier methods Female condom A female condom is a thin sheath that is worn over the penis during sex. Condoms keep sperm from going inside a woman's body. They can be used with a spermicide to increase their effectiveness. They should be disposed after a single use. Female condom A female condom is a soft, loose-fitting sheath that is put into the vagina before sex. The condom keeps sperm from going inside a woman's body. They should be disposed after a single use. Diaphragm A diaphragm is a soft, dome-shaped barrier. It is inserted into the vagina before sex, along with a spermicide. The diaphragm blocks sperm from entering the uterus, and the spermicide kills sperm. A diaphragm should be left in the vagina for 6-8 hours after sex and removed within 24 hours. A diaphragm is prescribed and fitted by a health care provider. A diaphragm should be replaced every 1-2 years, after giving birth, after gaining more than 15 lb (6.8 kg), and after pelvic surgery. Cervical cap A cervical cap is a round, soft latex or plastic cup that fits over the cervix. It is  inserted into the vagina before sex, along with spermicide. It blocks sperm from entering the uterus. The cap should be left in place for 6-8 hours after sex and removed within 48 hours. A cervical cap must be prescribed and fitted by a health care provider. It should be replaced every 2 years. Sponge A sponge is a soft, circular piece of polyurethane foam with spermicide on it. The sponge helps block sperm from entering the uterus, and the spermicide kills sperm. To use it, you make it wet and then insert it into the vagina. It should be inserted before sex, left in for at least 6 hours after sex, and removed and thrown away within 30 hours. Spermicides Spermicides are chemicals that kill or block sperm from entering the cervix and uterus. They can come as a cream, jelly, suppository, foam, or tablet. A spermicide should be inserted into the vagina with an applicator at least 10-15 minutes before sex to allow time for it to work. The process must be repeated every time you have sex. Spermicides do not require a prescription. Intrauterine contraception Intrauterine device (IUD) An IUD is a T-shaped device that is put in a woman's uterus. There are two types:  Hormone IUD.This type contains progestin, a synthetic form of the hormone progesterone. This type can stay in place for 3-5 years.  Copper IUD.This type is wrapped in copper wire. It can stay in place for 10 years.  Permanent methods of contraception Female tubal ligation In this method, a woman's fallopian tubes are sealed, tied, or blocked during surgery to prevent eggs from traveling to the uterus. Hysteroscopic sterilization In this method, a small, flexible insert is placed into each fallopian tube. The inserts cause scar tissue to form in the fallopian tubes and block them, so sperm cannot reach an egg. The procedure takes about 3 months to be effective. Another form of birth control must be used during those 3 months. Female  sterilization This is a procedure to tie off the tubes that carry sperm (vasectomy). After the procedure, the man can still ejaculate fluid (semen). Natural planning methods Natural family planning In this method, a couple does not have sex on days when the woman could become pregnant. Calendar method This means keeping track of the length of each   menstrual cycle, identifying the days when pregnancy can happen, and not having sex on those days. Ovulation method In this method, a couple avoids sex during ovulation. Symptothermal method This method involves not having sex during ovulation. The woman typically checks for ovulation by watching changes in her temperature and in the consistency of cervical mucus. Post-ovulation method In this method, a couple waits to have sex until after ovulation. Summary  Contraception, also called birth control, means methods or devices that prevent pregnancy.  Hormonal methods of contraception include implants, injections, pills, patches, vaginal rings, and emergency contraceptives.  Barrier methods of contraception can include female condoms, female condoms, diaphragms, cervical caps, sponges, and spermicides.  There are two types of IUDs (intrauterine devices). An IUD can be put in a woman's uterus to prevent pregnancy for 3-5 years.  Permanent sterilization can be done through a procedure for males, females, or both.  Natural family planning methods involve not having sex on days when the woman could become pregnant. This information is not intended to replace advice given to you by your health care provider. Make sure you discuss any questions you have with your health care provider. Document Released: 05/30/2005 Document Revised: 07/02/2016 Document Reviewed: 07/02/2016 Elsevier Interactive Patient Education  2018 [employer] Inc.  

## 2017-10-16 LAB — CYTOLOGY - PAP
CHLAMYDIA, DNA PROBE: NEGATIVE
Neisseria Gonorrhea: NEGATIVE

## 2017-10-17 ENCOUNTER — Encounter (INDEPENDENT_AMBULATORY_CARE_PROVIDER_SITE_OTHER): Payer: Self-pay

## 2017-10-17 ENCOUNTER — Encounter: Payer: Self-pay | Admitting: Student

## 2017-10-17 DIAGNOSIS — R87619 Unspecified abnormal cytological findings in specimens from cervix uteri: Secondary | ICD-10-CM | POA: Insufficient documentation

## 2017-10-23 ENCOUNTER — Encounter (HOSPITAL_COMMUNITY): Payer: Self-pay | Admitting: *Deleted

## 2017-10-23 ENCOUNTER — Inpatient Hospital Stay (HOSPITAL_COMMUNITY)
Admission: AD | Admit: 2017-10-23 | Discharge: 2017-10-23 | Disposition: A | Discharge status: Home / Self Care | Payer: Medicaid Other | Source: Ambulatory Visit | Attending: Obstetrics and Gynecology | Admitting: Obstetrics and Gynecology

## 2017-10-23 DIAGNOSIS — O34219 Maternal care for unspecified type scar from previous cesarean delivery: Secondary | ICD-10-CM | POA: Diagnosis not present

## 2017-10-23 DIAGNOSIS — O99332 Smoking (tobacco) complicating pregnancy, second trimester: Secondary | ICD-10-CM | POA: Diagnosis not present

## 2017-10-23 DIAGNOSIS — Z8489 Family history of other specified conditions: Secondary | ICD-10-CM | POA: Insufficient documentation

## 2017-10-23 DIAGNOSIS — Z79899 Other long term (current) drug therapy: Secondary | ICD-10-CM | POA: Diagnosis not present

## 2017-10-23 DIAGNOSIS — R1010 Upper abdominal pain, unspecified: Secondary | ICD-10-CM | POA: Diagnosis not present

## 2017-10-23 DIAGNOSIS — Z3A16 16 weeks gestation of pregnancy: Secondary | ICD-10-CM | POA: Insufficient documentation

## 2017-10-23 DIAGNOSIS — O219 Vomiting of pregnancy, unspecified: Secondary | ICD-10-CM | POA: Insufficient documentation

## 2017-10-23 DIAGNOSIS — R109 Unspecified abdominal pain: Secondary | ICD-10-CM

## 2017-10-23 DIAGNOSIS — O99612 Diseases of the digestive system complicating pregnancy, second trimester: Secondary | ICD-10-CM | POA: Insufficient documentation

## 2017-10-23 DIAGNOSIS — F17210 Nicotine dependence, cigarettes, uncomplicated: Secondary | ICD-10-CM | POA: Insufficient documentation

## 2017-10-23 DIAGNOSIS — O26892 Other specified pregnancy related conditions, second trimester: Secondary | ICD-10-CM | POA: Diagnosis not present

## 2017-10-23 DIAGNOSIS — K219 Gastro-esophageal reflux disease without esophagitis: Secondary | ICD-10-CM

## 2017-10-23 LAB — URINALYSIS, ROUTINE W REFLEX MICROSCOPIC
Bilirubin Urine: NEGATIVE
GLUCOSE, UA: NEGATIVE mg/dL
Hgb urine dipstick: NEGATIVE
KETONES UR: 5 mg/dL — AB
Leukocytes, UA: NEGATIVE
NITRITE: NEGATIVE
PH: 6 (ref 5.0–8.0)
Protein, ur: NEGATIVE mg/dL
SPECIFIC GRAVITY, URINE: 1.021 (ref 1.005–1.030)

## 2017-10-23 MED ORDER — ONDANSETRON 8 MG PO TBDP
8.0000 mg | ORAL_TABLET | Freq: Once | ORAL | Status: AC
  Administered 2017-10-23: 8 mg via ORAL
  Filled 2017-10-23: qty 1

## 2017-10-23 MED ORDER — METOCLOPRAMIDE HCL 10 MG PO TABS: 10.0000 mg | ORAL_TABLET | Freq: Three times a day (TID) | ORAL | 0 refills | Status: DC

## 2017-10-23 MED ORDER — PANTOPRAZOLE SODIUM 40 MG PO TBEC: 40.0000 mg | DELAYED_RELEASE_TABLET | Freq: Every day | ORAL | 1 refills | Status: DC

## 2017-10-23 MED ORDER — FAMOTIDINE 20 MG PO TABS
40.0000 mg | ORAL_TABLET | Freq: Once | ORAL | Status: AC
  Administered 2017-10-23: 40 mg via ORAL
  Filled 2017-10-23: qty 2

## 2017-10-23 NOTE — Discharge Instructions (Signed)

## 2017-10-23 NOTE — MAU Provider Note (Signed)
History     CSN: [id number]  Arrival date and time: 10/23/17 1245   First Provider Initiated Contact with Patient 10/23/17 1454      Chief Complaint  Patient presents with  . Abdominal Pain  . Nausea   HPI   [name] is a 27 y.o. female [redacted]w[redacted]d here with upper abdominal pain and nausea. Says this morning she vomited one time very hard. Said she noticed spots of red blood in her vomit. Says she has not had trouble with nausea and vomiting in this pregnancy. Says she feels like there is something sitting at the top of her abdomen and it will not digest. The pain is constant at times. She is taking Zantac BID; she is unsure of the dosage. Says she does not feel like it is working.  Patient requesting to eat her chinese food while waiting.   OB History    Gravida  9   Para  5   Term  5   Preterm  0   AB  3   Living  5     SAB  1   TAB  2   Ectopic  0   Multiple  0   Live Births  5           Past Medical History:  Diagnosis Date  . Abnormal Pap smear     Past Surgical History:  Procedure Laterality Date  . CESAREAN SECTION  01/02/2012   Procedure: CESAREAN SECTION;  Surgeon: Tilda Burrow, MD;  Location: [facility];  Service: Gynecology;  Laterality: N/A;  Primary cesarean section of baby boy ay 0125 APGAR 8/9. NRFHT, Abruption=5%    Family History  Problem Relation Age of Onset  . Anesthesia problems Neg Hx   . Other Neg Hx     Social History   Tobacco Use  . Smoking status: Current Some Day Smoker    Packs/day: 0.25    Types: Cigarettes    Last attempt to quit: 05/12/2011    Years since quitting: 6.4  . Smokeless tobacco: Never Used  Substance Use Topics  . Alcohol use: No  . Drug use: No    Allergies: No Known Allergies  Medications Prior to Admission  Medication Sig Dispense Refill Last Dose  . acetaminophen (TYLENOL) 500 MG tablet Take 500 mg by mouth every 6 (six) hours as needed for mild pain or headache.    10/23/2017 at  Unknown time  . Prenatal Vit-Fe Fumarate-FA (MULTIVITAMIN-PRENATAL) 27-0.8 MG TABS tablet Take 1 tablet by mouth daily at 12 noon.   10/23/2017 at Unknown time  . ranitidine (ZANTAC) 150 MG tablet Take 1 tablet (150 mg total) by mouth 2 (two) times daily. 60 tablet 0 10/22/2017 at Unknown time   Results for orders placed or performed during the hospital encounter of 10/23/17 (from the past 48 hour(s))  Urinalysis, Routine w reflex microscopic     Status: Abnormal   Collection Time: 10/23/17  1:12 PM  Result Value Ref Range   Color, Urine YELLOW YELLOW   APPearance CLEAR CLEAR   Specific Gravity, Urine 1.021 1.005 - 1.030   pH 6.0 5.0 - 8.0   Glucose, UA NEGATIVE NEGATIVE mg/dL   Hgb urine dipstick NEGATIVE NEGATIVE   Bilirubin Urine NEGATIVE NEGATIVE   Ketones, ur 5 (A) NEGATIVE mg/dL   Protein, ur NEGATIVE NEGATIVE mg/dL   Nitrite NEGATIVE NEGATIVE   Leukocytes, UA NEGATIVE NEGATIVE    Comment: Performed at [facility], [address] [place], [place] [address]   Review of Systems  Gastrointestinal: Positive for abdominal pain and nausea. Negative for constipation and vomiting (None now).  Genitourinary: Negative for dysuria.   Physical Exam   Blood pressure 107/63, pulse 88, temperature 99 F (37.2 C), resp. rate 15, height  (1.6 m), weight 157 lb (71.2 kg), last menstrual period 03/20/2017, SpO2 99 %, unknown if currently breastfeeding.  Physical Exam  Constitutional: She is oriented to person, place, and time. She appears well-developed and well-nourished. No distress.  HENT:  Head: Normocephalic.  Eyes: Pupils are equal, round, and reactive to light.  Respiratory: Effort normal and breath sounds normal.  GI: Soft. Normal appearance. There is tenderness in the epigastric area. There is no rigidity, no rebound and no guarding.  Musculoskeletal: Normal range of motion.  Neurological: She is alert and oriented to person, place, and time.  Skin: Skin is warm. She is  not diaphoretic.  Psychiatric: Her behavior is normal.   MAU Course  Procedures None  MDM  UA Pepcid & Zofran given in MAU Patient eating in MAU, no N/V  Assessment and Plan   A:  1. Gastroesophageal reflux disease, esophagitis presence not specified   2. Nausea and vomiting in pregnancy   3. [redacted] weeks gestation of pregnancy    P:  Discharge home in stable condition GERD diet discussed in detail Small, frequent meals Increase oral fluids RX: Protonix, Reglan  Stop Zantac once Protonix is started   Venia Carbon I, NP 10/23/2017 8:37 PM

## 2017-10-23 NOTE — MAU Note (Signed)
Pt reports nausea since this am, vomited x one this am with blood in it. Upper abd pain since this am also, took tylenol without relief.

## 2017-10-24 ENCOUNTER — Telehealth: Payer: Self-pay

## 2017-10-24 NOTE — Telephone Encounter (Signed)
Pt called requesting test results.  LM for pt that we are returning her call to please return the call back.

## 2017-10-25 ENCOUNTER — Telehealth: Payer: Self-pay | Admitting: Student

## 2017-10-25 NOTE — Telephone Encounter (Signed)
I called  [place] back and she is having the same intermittent upper abdominal pain that she was having 10/23/17 when she went to [place]. States she is taking her reglan, zantac, prilosec.  I discussed with Dr. Earlene Plater and instructed patient it may take more time for the medicine to work. For now keep taking all 3 meds and follow a bland and BRAT diet- discussed no fast food, no fried food, no spices, etc. Let us know if no improvement. She voices understanding.

## 2017-10-25 NOTE — Telephone Encounter (Signed)
Pt states went to MAU they said abdominal pain may be from acid reflux but pt states a lot of pain. Please call pt.

## 2017-10-30 NOTE — Telephone Encounter (Signed)
I called [place] back and heard phone ring many times but no one answered and no voice mail picked up.

## 2017-10-30 NOTE — Telephone Encounter (Signed)
[place] called back to front desk and asked to speak to nurse. She states she wants to know her pap results and other swab. I informed her that they did a pap  And on the pap also checked for gc which was negative. I informed her that her pap was abnormal- Low grade and the provider recommended a colposcopy after she deliveries. I explained what a colposcopy and answered her questions. I assured her she can discuss this further at next ob appt. She voices understanding.

## 2017-11-08 ENCOUNTER — Encounter (HOSPITAL_COMMUNITY): Payer: Self-pay

## 2017-11-15 ENCOUNTER — Ambulatory Visit (INDEPENDENT_AMBULATORY_CARE_PROVIDER_SITE_OTHER): Payer: Medicaid Other | Admitting: Student

## 2017-11-15 VITALS — BP 106/63 | HR 86 | Wt 159.2 lb

## 2017-11-15 DIAGNOSIS — R12 Heartburn: Secondary | ICD-10-CM

## 2017-11-15 DIAGNOSIS — O26892 Other specified pregnancy related conditions, second trimester: Secondary | ICD-10-CM

## 2017-11-15 DIAGNOSIS — O0990 Supervision of high risk pregnancy, unspecified, unspecified trimester: Secondary | ICD-10-CM

## 2017-11-15 MED ORDER — RANITIDINE HCL 150 MG PO TABS: 150.0000 mg | ORAL_TABLET | Freq: Two times a day (BID) | ORAL | 1 refills | Status: DC

## 2017-11-15 NOTE — Progress Notes (Addendum)
   PRENATAL VISIT NOTE  Subjective:  Monique Harris is a 27 y.o. [phone] at 2842w4d being seen today for ongoing prenatal care.  She is currently monitored for the following issues for this high-risk pregnancy and has History of cesarean delivery, currently pregnant; History of shoulder dystocia in prior pregnancy, currently pregnant in third trimester; History of placenta abruption; Supervision of high risk pregnancy, antepartum; Short interval between pregnancies affecting pregnancy, antepartum; Grand multiparity; and Abnormal Pap smear of cervix on their problem list.  Patient reports no complaints.  Contractions: Not present. Vag. Bleeding: None.  Movement: Absent. Denies leaking of fluid.   The following portions of the patient's history were reviewed and updated as appropriate: allergies, current medications, past family history, past medical history, past social history, past surgical history and problem list. Problem list updated.  Objective:   Vitals:   11/15/17 1110  BP: 106/63  Pulse: 86  Weight: 159 lb 3.2 oz (72.2 kg)    Fetal Status: Fetal Heart Rate (bpm): 158   Movement: Absent     General:  Alert, oriented and cooperative. Patient is in no acute distress.  Skin: Skin is warm and dry. No rash noted.   Cardiovascular: Normal heart rate noted  Respiratory: Normal respiratory effort, no problems with respiration noted  Abdomen: Soft, gravid, appropriate for gestational age.  Pain/Pressure: Absent     Pelvic: Cervical exam deferred        Extremities: Normal range of motion.  Edema: None  Mental Status: Normal mood and affect. Normal behavior. Normal judgment and thought content.   Assessment and Plan:  Pregnancy: [id number] at 7342w4d  1. Supervision of high risk pregnancy, antepartum -Reviewed results of pap smear & need for colposcopy -- will do postpartum -Needs to reset Mychart password -doing well. Has anatomy ultrasound tomorrow.  -Will review and sign TOLAC form at  a future visit  2. Heartburn during pregnancy in second trimester -doing well on meds. Requests refill for zantac. Has enough reglan.  - ranitidine (ZANTAC) 150 MG tablet; Take 1 tablet (150 mg total) by mouth 2 (two) times daily.  Dispense: 60 tablet; Refill: 1  Preterm labor symptoms and general obstetric precautions including but not limited to vaginal bleeding, contractions, leaking of fluid and fetal movement were reviewed in detail with the patient. Please refer to After Visit Summary for other counseling recommendations.  Return in about 1 month (around 12/13/2017) for Routine OB.  Future Appointments  Date Time Provider Department Center  11/16/2017  8:30 AM [facility] US 1 [facility]    Judeth HornErin Colleena Kurtenbach, NP

## 2017-11-15 NOTE — Patient Instructions (Signed)
Second Trimester of Pregnancy The second trimester is from week 13 through week 28, month 4 through 6. This is often the time in pregnancy that you feel your best. Often times, morning sickness has lessened or quit. You may have more energy, and you may get hungry more often. Your unborn baby (fetus) is growing rapidly. At the end of the sixth month, he or she is about 9 inches long and weighs about 1 pounds. You will likely feel the baby move (quickening) between 18 and 20 weeks of pregnancy.  Research childbirth classes and hospital preregistration at [email or web address]  Follow these instructions at home:  Avoid all smoking, herbs, and alcohol. Avoid drugs not approved by your doctor.  Do not use any tobacco products, including cigarettes, chewing tobacco, and electronic cigarettes. If you need help quitting, ask your doctor. You may get counseling or other support to help you quit.  Only take medicine as told by your doctor. Some medicines are safe and some are not during pregnancy.  Exercise only as told by your doctor. Stop exercising if you start having cramps.  Eat regular, healthy meals.  Wear a good support bra if your breasts are tender.  Do not use hot tubs, steam rooms, or saunas.  Wear your seat belt when driving.  Avoid raw meat, uncooked cheese, and liter boxes and soil used by cats.  Take your prenatal vitamins.  Take 1500-2000 milligrams of calcium daily starting at the 20th week of pregnancy until you deliver your baby.  Try taking medicine that helps you poop (stool softener) as needed, and if your doctor approves. Eat more fiber by eating fresh fruit, vegetables, and whole grains. Drink enough fluids to keep your pee (urine) clear or pale yellow.  Take warm water baths (sitz baths) to soothe pain or discomfort caused by hemorrhoids. Use hemorrhoid cream if your doctor approves.  If you have puffy, bulging veins (varicose veins), wear support hose. Raise  (elevate) your feet for 15 minutes, 3-4 times a day. Limit salt in your diet.  Avoid heavy lifting, wear low heals, and sit up straight.  Rest with your legs raised if you have leg cramps or low back pain.  Visit your dentist if you have not gone during your pregnancy. Use a soft toothbrush to brush your teeth. Be gentle when you floss.  You can have sex (intercourse) unless your doctor tells you not to.  Go to your doctor visits.  Get help if:  You feel dizzy.  You have mild cramps or pressure in your lower belly (abdomen).  You have a nagging pain in your belly area.  You continue to feel sick to your stomach (nauseous), throw up (vomit), or have watery poop (diarrhea).  You have bad smelling fluid coming from your vagina.  You have pain with peeing (urination). Get help right away if:  You have a fever.  You are leaking fluid from your vagina.  You have spotting or bleeding from your vagina.  You have severe belly cramping or pain.  You lose or gain weight rapidly.  You have trouble catching your breath and have chest pain.  You notice sudden or extreme puffiness (swelling) of your face, hands, ankles, feet, or legs.  You have not felt the baby move in over an hour.  You have severe headaches that do not go away with medicine.  You have vision changes. This information is not intended to replace advice given to you by your health care provider. Make   sure you discuss any questions you have with your health care provider. Document Released: 08/24/2009 Document Revised: 11/05/2015 Document Reviewed: 07/31/2012 Elsevier Interactive Patient Education  2017 [employer]    

## 2017-11-16 ENCOUNTER — Other Ambulatory Visit: Payer: Self-pay | Admitting: Student

## 2017-11-16 ENCOUNTER — Ambulatory Visit (HOSPITAL_COMMUNITY)
Admission: RE | Admit: 2017-11-16 | Discharge: 2017-11-16 | Disposition: A | Discharge status: Home / Self Care | Payer: Medicaid Other | Source: Ambulatory Visit | Attending: Student | Admitting: Student

## 2017-11-16 DIAGNOSIS — O0992 Supervision of high risk pregnancy, unspecified, second trimester: Secondary | ICD-10-CM | POA: Insufficient documentation

## 2017-11-16 DIAGNOSIS — O34219 Maternal care for unspecified type scar from previous cesarean delivery: Secondary | ICD-10-CM | POA: Diagnosis not present

## 2017-11-16 DIAGNOSIS — Z363 Encounter for antenatal screening for malformations: Secondary | ICD-10-CM

## 2017-11-16 DIAGNOSIS — Z3A19 19 weeks gestation of pregnancy: Secondary | ICD-10-CM

## 2017-11-16 DIAGNOSIS — O09292 Supervision of pregnancy with other poor reproductive or obstetric history, second trimester: Secondary | ICD-10-CM | POA: Diagnosis not present

## 2017-11-16 DIAGNOSIS — O09299 Supervision of pregnancy with other poor reproductive or obstetric history, unspecified trimester: Secondary | ICD-10-CM

## 2017-11-16 DIAGNOSIS — Z641 Problems related to multiparity: Secondary | ICD-10-CM

## 2017-11-16 DIAGNOSIS — O0990 Supervision of high risk pregnancy, unspecified, unspecified trimester: Secondary | ICD-10-CM

## 2017-11-16 DIAGNOSIS — O09892 Supervision of other high risk pregnancies, second trimester: Secondary | ICD-10-CM

## 2017-12-07 ENCOUNTER — Telehealth: Payer: Self-pay | Admitting: Family Medicine

## 2017-12-07 DIAGNOSIS — O26892 Other specified pregnancy related conditions, second trimester: Secondary | ICD-10-CM

## 2017-12-07 DIAGNOSIS — R12 Heartburn: Principal | ICD-10-CM
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
## 2017-12-12 MED ORDER — METOCLOPRAMIDE HCL 10 MG PO TABS: 10.0000 mg | ORAL_TABLET | Freq: Three times a day (TID) | ORAL | 0 refills | Status: DC

## 2017-12-12 MED ORDER — RANITIDINE HCL 150 MG PO TABS: 150.0000 mg | ORAL_TABLET | Freq: Two times a day (BID) | ORAL | 1 refills | Status: DC

## 2017-12-12 NOTE — Telephone Encounter (Signed)
Patient is requesting zantac and reglan refills.  Rx sent to pharmacy.  Patient asked if she would have to pay for those medications advised her its up to her insurance.

## 2017-12-19 ENCOUNTER — Ambulatory Visit (INDEPENDENT_AMBULATORY_CARE_PROVIDER_SITE_OTHER): Payer: Medicaid Other | Admitting: Student

## 2017-12-19 VITALS — BP 113/72 | HR 79 | Wt 156.5 lb

## 2017-12-19 DIAGNOSIS — O0990 Supervision of high risk pregnancy, unspecified, unspecified trimester: Secondary | ICD-10-CM

## 2017-12-19 DIAGNOSIS — O34219 Maternal care for unspecified type scar from previous cesarean delivery: Secondary | ICD-10-CM

## 2017-12-19 MED ORDER — PRENATAL 27-0.8 MG PO TABS: 1.0000 | ORAL_TABLET | Freq: Every day | ORAL | 11 refills | Status: AC

## 2017-12-19 NOTE — Patient Instructions (Addendum)
[email or web address]     Vaginal Birth After Cesarean Delivery Vaginal birth after cesarean delivery (VBAC) is giving birth vaginally after previously delivering a baby by a cesarean. In the past, if a woman had a cesarean delivery, all births afterward would be done by cesarean delivery. This is no longer true. It can be safe for the mother to try a vaginal delivery after having a cesarean delivery. It is important to discuss VBAC with your health care provider early in the pregnancy so you can understand the risks, benefits, and options. It will give you time to decide what is best in your particular case. The final decision about whether to have a VBAC or repeat cesarean delivery should be between you and your health care provider. Any changes in your health or your baby's health during your pregnancy may make it necessary to change your initial decision about VBAC. Women who plan to have a VBAC should check with their health care provider to be sure that:  The previous cesarean delivery was done with a low transverse uterine cut (incision) (not a vertical classical incision).  The birth canal is big enough for the baby.  There were no other operations on the uterus.  An electronic fetal monitor (EFM) will be on at all times during labor.  An operating room will be available and ready in case an emergency cesarean delivery is needed.  A health care provider and surgical nursing staff will be available at all times during labor to be ready to do an emergency delivery cesarean if necessary.  An anesthesiologist will be present in case an emergency cesarean delivery is needed.  The nursery is prepared and has adequate personnel and necessary equipment available to care for the baby in case of an emergency cesarean delivery. Benefits of VBAC  Shorter stay in the hospital.  Avoidance of risks associated with cesarean delivery, such as: ? Surgical complications, such as opening of the  incision or hernia in the incision. ? Injury to other organs. ? Fever. This can occur if an infection develops after surgery. It can also occur as a reaction to the medicine given to make you numb during the surgery.  Less blood loss and need for blood transfusions.  Lower risk of blood clots and infection.  Shorter recovery.  Decreased risk for having to remove the uterus (hysterectomy).  Decreased risk for the placenta to completely or partially cover the opening of the uterus (placenta previa) with a future pregnancy.  Decrease risk in future labor and delivery. Risks of a VBAC  Tearing (rupture) of the uterus. This is occurs in less than 1% of VBACs. The risk of this happening is higher if: ? Steps are taken to begin the labor process (induce labor) or stimulate or strengthen contractions (augment labor). ? Medicine is used to soften (ripen) the cervix.  Having to remove the uterus (hysterectomy) if it ruptures. VBAC should not be done if:  The previous cesarean delivery was done with a vertical (classical) or T-shaped incision or you do not know what kind of incision was made.  You had a ruptured uterus.  You have had certain types of surgery on your uterus, such as removal of uterine fibroids. Ask your health care provider about other types of surgeries that prevent you from having a VBAC.  You have certain medical or childbirth (obstetrical) problems.  There are problems with the baby.  You have had two previous cesarean deliveries and no vaginal deliveries. Other facts to  know about VBAC:  It is safe to have an epidural anesthetic with VBAC.  It is safe to turn the baby from a breech position (attempt an external cephalic version).  It is safe to try a VBAC with twins.  VBAC may not be successful if your baby weights 8.8 lb (4 kg) or more. However, weight predictions are not always accurate and should not be used alone to decide if VBAC is right for you.  There is  an increased failure rate if the time between the cesarean delivery and VBAC is less than 19 months.  Your health care provider may advise against a VBAC if you have preeclampsia (high blood pressure, protein in the urine, and swelling of face and extremities).  VBAC is often successful if you previously gave birth vaginally.  VBAC is often successful when the labor starts spontaneously before the due date.  Delivering a baby through a VBAC is similar to having a normal spontaneous vaginal delivery. This information is not intended to replace advice given to you by your health care provider. Make sure you discuss any questions you have with your health care provider. Document Released: 11/20/2006 Document Revised: 11/05/2015 Document Reviewed: 12/27/2012 Elsevier Interactive Patient Education  [employer].

## 2017-12-19 NOTE — Progress Notes (Signed)
   PRENATAL VISIT NOTE  Subjective:  Monique Harris is a 27 y.o. [phone] at 6467w3d being seen today for ongoing prenatal care.  She is currently monitored for the following issues for this low-risk pregnancy and has History of cesarean delivery, currently pregnant; History of shoulder dystocia in prior pregnancy, currently pregnant in third trimester; History of placenta abruption; Supervision of high risk pregnancy, antepartum; Short interval between pregnancies affecting pregnancy, antepartum; Grand multiparity; and Abnormal Pap smear of cervix on their problem list.  Patient reports no complaints.  Contractions: Not present. Vag. Bleeding: None.  Movement: Present. Denies leaking of fluid.   The following portions of the patient's history were reviewed and updated as appropriate: allergies, current medications, past family history, past medical history, past social history, past surgical history and problem list. Problem list updated.  Objective:   Vitals:   12/19/17 1104  BP: 113/72  Pulse: 79  Weight: 156 lb 8 oz (71 kg)    Fetal Status: Fetal Heart Rate (bpm): 143 Fundal Height: 23 cm Movement: Present     General:  Alert, oriented and cooperative. Patient is in no acute distress.  Skin: Skin is warm and dry. No rash noted.   Cardiovascular: Normal heart rate noted  Respiratory: Normal respiratory effort, no problems with respiration noted  Abdomen: Soft, gravid, appropriate for gestational age.  Pain/Pressure: Absent     Pelvic: Cervical exam deferred        Extremities: Normal range of motion.  Edema: None  Mental Status: Normal mood and affect. Normal behavior. Normal judgment and thought content.   Assessment and Plan:  Pregnancy: [id number] at 5467w3d  1. Supervision of high risk pregnancy, antepartum  - Prenatal Vit-Fe Fumarate-FA (MULTIVITAMIN-PRENATAL) 27-0.8 MG TABS tablet; Take 1 tablet by mouth daily at 12 noon.  Dispense: 30 tablet; Refill: 11  2. History of  cesarean delivery, currently pregnant [facility]  Preterm labor symptoms and general obstetric precautions including but not limited to vaginal bleeding, contractions, leaking of fluid and fetal movement were reviewed in detail with the patient. Please refer to After Visit Summary for other counseling recommendations.  Return in about 1 month (around 01/16/2018) for Routine OB & 28 wk labs.  Future Appointments  Date Time Provider Department Center  01/17/2018  8:20 AM [facility]  01/17/2018  8:55 AM Raelyn Moraawson, [place], CNM [facility]    Judeth HornErin Kaymarie Wynn, NP

## 2017-12-29 ENCOUNTER — Encounter: Payer: Self-pay | Admitting: *Deleted

## 2018-01-16 ENCOUNTER — Other Ambulatory Visit: Payer: Self-pay | Admitting: *Deleted

## 2018-01-16 DIAGNOSIS — O0990 Supervision of high risk pregnancy, unspecified, unspecified trimester: Secondary | ICD-10-CM

## 2018-01-17 ENCOUNTER — Encounter: Payer: Medicaid Other | Admitting: Obstetrics and Gynecology

## 2018-01-17 ENCOUNTER — Other Ambulatory Visit: Payer: Medicaid Other

## 2018-01-23 ENCOUNTER — Ambulatory Visit (INDEPENDENT_AMBULATORY_CARE_PROVIDER_SITE_OTHER): Payer: Medicaid Other | Admitting: Student

## 2018-01-23 ENCOUNTER — Other Ambulatory Visit: Payer: Medicaid Other

## 2018-01-23 VITALS — BP 101/62 | HR 85 | Wt 154.0 lb

## 2018-01-23 DIAGNOSIS — Z23 Encounter for immunization: Secondary | ICD-10-CM

## 2018-01-23 DIAGNOSIS — O2613 Low weight gain in pregnancy, third trimester: Secondary | ICD-10-CM

## 2018-01-23 DIAGNOSIS — O0990 Supervision of high risk pregnancy, unspecified, unspecified trimester: Secondary | ICD-10-CM

## 2018-01-23 DIAGNOSIS — O2610 Low weight gain in pregnancy, unspecified trimester: Secondary | ICD-10-CM | POA: Insufficient documentation

## 2018-01-23 DIAGNOSIS — O26892 Other specified pregnancy related conditions, second trimester: Secondary | ICD-10-CM

## 2018-01-23 DIAGNOSIS — R12 Heartburn: Secondary | ICD-10-CM

## 2018-01-23 MED ORDER — PANTOPRAZOLE SODIUM 40 MG PO TBEC: 40.0000 mg | DELAYED_RELEASE_TABLET | Freq: Every day | ORAL | 1 refills | Status: DC

## 2018-01-23 NOTE — Progress Notes (Signed)
   PRENATAL VISIT NOTE  Subjective:  Monique Harris is a 27 y.o. [phone] at 5042w3d being seen today for ongoing prenatal care.  She is currently monitored for the following issues for this low-risk pregnancy and has History of cesarean delivery, currently pregnant; History of shoulder dystocia in prior pregnancy, currently pregnant in third trimester; History of placenta abruption; Supervision of high risk pregnancy, antepartum; Short interval between pregnancies affecting pregnancy, antepartum; Grand multiparity; Abnormal Pap smear of cervix; and Insufficient weight gain during pregnancy on their problem list.  Patient reports heartburn and vomiting. Reports vomiting every night. Is not nauseated during the day. Vomiting wakes her from sleep. Was taking zantac but recently ran out; still vomiting while taking zantac & reglan. Previously prescribed protonix but doesn't recall getting that filled. Contractions: Not present. Vag. Bleeding: None.  Movement: Present. Denies leaking of fluid.   The following portions of the patient's history were reviewed and updated as appropriate: allergies, current medications, past family history, past medical history, past social history, past surgical history and problem list. Problem list updated.  Objective:   Vitals:   01/23/18 0829  BP: 101/62  Pulse: 85  Weight: 154 lb (69.9 kg)    Fetal Status: Fetal Heart Rate (bpm): 157 Fundal Height: 27 cm Movement: Present     General:  Alert, oriented and cooperative. Patient is in no acute distress.  Skin: Skin is warm and dry. No rash noted.   Cardiovascular: Normal heart rate noted  Respiratory: Normal respiratory effort, no problems with respiration noted  Abdomen: Soft, gravid, appropriate for gestational age.  Pain/Pressure: Absent     Pelvic: Cervical exam deferred        Extremities: Normal range of motion.  Edema: None  Mental Status: Normal mood and affect. Normal behavior. Normal judgment and  thought content.   Assessment and Plan:  Pregnancy: [id number] at 6742w3d  1. Supervision of high risk pregnancy, antepartum -28 wk labs done today -Depo vs BTL for contraception. Signed 30 day papers today.   2. Need for Tdap vaccination  - Tdap vaccine greater than or equal to 7yo IM  3. Heartburn during pregnancy in second trimester  - pantoprazole (PROTONIX) 40 MG tablet; Take 1 tablet (40 mg total) by mouth daily.  Dispense: 30 tablet; Refill: 1  4. Insufficient weight gain during pregnancy in third trimester -Likely d/t nightly vomiting. Rx protonix. Discussed increasing caloric intake during the day; can use meal replacement shakes if difficulty with solids. Watch weight closely. May consider nutrition consult if doesn't improve.   Preterm labor symptoms and general obstetric precautions including but not limited to vaginal bleeding, contractions, leaking of fluid and fetal movement were reviewed in detail with the patient. Please refer to After Visit Summary for other counseling recommendations.  Return in about 2 weeks (around 02/06/2018) for Routine OB.  No future appointments.  Judeth HornErin Miciah Covelli, NP

## 2018-01-23 NOTE — Patient Instructions (Signed)
Research childbirth classes and hospital preregistration at [email or web address]  Fetal Movement Counts Patient Name: ________________________________________________ Patient Due Date: ____________________ What is a fetal movement count? A fetal movement count is the number of times that you feel your baby move during a certain amount of time. This may also be called a fetal kick count. A fetal movement count is recommended for every pregnant woman. You may be asked to start counting fetal movements as early as week 28 of your pregnancy. Pay attention to when your baby is most active. You may notice your baby's sleep and wake cycles. You may also notice things that make your baby move more. You should do a fetal movement count:  When your baby is normally most active.  At the same time each day.  A good time to count movements is while you are resting, after having something to eat and drink. How do I count fetal movements? 1. Find a quiet, comfortable area. Sit, or lie down on your side. 2. Write down the date, the start time and stop time, and the number of movements that you felt between those two times. Take this information with you to your health care visits. 3. For 2 hours, count kicks, flutters, swishes, rolls, and jabs. You should feel at least 10 movements during 2 hours. 4. You may stop counting after you have felt 10 movements. 5. If you do not feel 10 movements in 2 hours, have something to eat and drink. Then, keep resting and counting for 1 hour. If you feel at least 4 movements during that hour, you may stop counting. Contact a health care provider if:  You feel fewer than 4 movements in 2 hours.  Your baby is not moving like he or she usually does. Date: ____________ Start time: ____________ Stop time: ____________ Movements: ____________ Date: ____________ Start time: ____________ Stop time: ____________ Movements: ____________ Date: ____________ Start time: ____________  Stop time: ____________ Movements: ____________ Date: ____________ Start time: ____________ Stop time: ____________ Movements: ____________ Date: ____________ Start time: ____________ Stop time: ____________ Movements: ____________ Date: ____________ Start time: ____________ Stop time: ____________ Movements: ____________ Date: ____________ Start time: ____________ Stop time: ____________ Movements: ____________ Date: ____________ Start time: ____________ Stop time: ____________ Movements: ____________ Date: ____________ Start time: ____________ Stop time: ____________ Movements: ____________ This information is not intended to replace advice given to you by your health care provider. Make sure you discuss any questions you have with your health care provider. Document Released: 06/29/2006 Document Revised: 01/27/2016 Document Reviewed: 07/09/2015 Elsevier Interactive Patient Education  2018 [employer]  Braxton Hicks Contractions Contractions of the uterus can occur throughout pregnancy, but they are not always a sign that you are in labor. You may have practice contractions called Braxton Hicks contractions. These false labor contractions are sometimes confused with true labor. What are Braxton Hicks contractions? Braxton Hicks contractions are tightening movements that occur in the muscles of the uterus before labor. Unlike true labor contractions, these contractions do not result in opening (dilation) and thinning of the cervix. Toward the end of pregnancy (32-34 weeks), Braxton Hicks contractions can happen more often and may become stronger. These contractions are sometimes difficult to tell apart from true labor because they can be very uncomfortable. You should not feel embarrassed if you go to the hospital with false labor. Sometimes, the only way to tell if you are in true labor is for your health care provider to look for changes in the cervix. The health care provider will   do a physical  exam and may monitor your contractions. If you are not in true labor, the exam should show that your cervix is not dilating and your water has not broken. If there are other health problems associated with your pregnancy, it is completely safe for you to be sent home with false labor. You may continue to have Braxton Hicks contractions until you go into true labor. How to tell the difference between true labor and false labor True labor  Contractions last 30-70 seconds.  Contractions become very regular.  Discomfort is usually felt in the top of the uterus, and it spreads to the lower abdomen and low back.  Contractions do not go away with walking.  Contractions usually become more intense and increase in frequency.  The cervix dilates and gets thinner. False labor  Contractions are usually shorter and not as strong as true labor contractions.  Contractions are usually irregular.  Contractions are often felt in the front of the lower abdomen and in the groin.  Contractions may go away when you walk around or change positions while lying down.  Contractions get weaker and are shorter-lasting as time goes on.  The cervix usually does not dilate or become thin. Follow these instructions at home:  Take over-the-counter and prescription medicines only as told by your health care provider.  Keep up with your usual exercises and follow other instructions from your health care provider.  Eat and drink lightly if you think you are going into labor.  If Braxton Hicks contractions are making you uncomfortable: ? Change your position from lying down or resting to walking, or change from walking to resting. ? Sit and rest in a tub of warm water. ? Drink enough fluid to keep your urine pale yellow. Dehydration may cause these contractions. ? Do slow and deep breathing several times an hour.  Keep all follow-up prenatal visits as told by your health care provider. This is  important. Contact a health care provider if:  You have a fever.  You have continuous pain in your abdomen. Get help right away if:  Your contractions become stronger, more regular, and closer together.  You have fluid leaking or gushing from your vagina.  You pass blood-tinged mucus (bloody show).  You have bleeding from your vagina.  You have low back pain that you never had before.  You feel your baby's head pushing down and causing pelvic pressure.  Your baby is not moving inside you as much as it used to. Summary  Contractions that occur before labor are called Braxton Hicks contractions, false labor, or practice contractions.  Braxton Hicks contractions are usually shorter, weaker, farther apart, and less regular than true labor contractions. True labor contractions usually become progressively stronger and regular and they become more frequent.  Manage discomfort from Braxton Hicks contractions by changing position, resting in a warm bath, drinking plenty of water, or practicing deep breathing. This information is not intended to replace advice given to you by your health care provider. Make sure you discuss any questions you have with your health care provider. Document Released: 10/13/2016 Document Revised: 10/13/2016 Document Reviewed: 10/13/2016 Elsevier Interactive Patient Education  2018 Elsevier Inc.    

## 2018-01-24 LAB — HIV ANTIBODY (ROUTINE TESTING W REFLEX): HIV SCREEN 4TH GENERATION: NONREACTIVE

## 2018-01-24 LAB — GLUCOSE TOLERANCE, 2 HOURS W/ 1HR
Glucose, 1 hour: 143 mg/dL (ref 65–179)
Glucose, 2 hour: 117 mg/dL (ref 65–152)
Glucose, Fasting: 87 mg/dL (ref 65–91)

## 2018-01-24 LAB — CBC
HEMATOCRIT: 29.9 % — AB (ref 34.0–46.6)
Hemoglobin: 9.9 g/dL — ABNORMAL LOW (ref 11.1–15.9)
MCH: 27.7 pg (ref 26.6–33.0)
MCHC: 33.1 g/dL (ref 31.5–35.7)
MCV: 84 fL (ref 79–97)
Platelets: 257 10*3/uL (ref 150–450)
RBC: 3.57 x10E6/uL — ABNORMAL LOW (ref 3.77–5.28)
RDW: 15.1 % (ref 12.3–15.4)
WBC: 7.2 10*3/uL (ref 3.4–10.8)

## 2018-01-24 LAB — RPR: RPR Ser Ql: NONREACTIVE

## 2018-01-26 ENCOUNTER — Telehealth: Payer: Self-pay | Admitting: Family Medicine

## 2018-01-26 NOTE — Telephone Encounter (Signed)
Calling to get Lab Results (2 Hour)

## 2018-01-29 NOTE — Telephone Encounter (Signed)
Left message for patient to return call to [facility] if she desires test results.  Clovis PuMartin, Viana Sleep L, RN

## 2018-02-06 ENCOUNTER — Encounter: Payer: Medicaid Other | Admitting: Advanced Practice Midwife

## 2018-02-13 ENCOUNTER — Encounter: Payer: Medicaid Other | Admitting: Advanced Practice Midwife

## 2018-02-14 ENCOUNTER — Encounter: Payer: Self-pay | Admitting: *Deleted

## 2018-02-15 ENCOUNTER — Ambulatory Visit (INDEPENDENT_AMBULATORY_CARE_PROVIDER_SITE_OTHER): Payer: Medicaid Other | Admitting: Student

## 2018-02-15 DIAGNOSIS — O0990 Supervision of high risk pregnancy, unspecified, unspecified trimester: Secondary | ICD-10-CM

## 2018-02-15 NOTE — Patient Instructions (Signed)

## 2018-02-15 NOTE — Progress Notes (Signed)
   PRENATAL VISIT NOTE  Subjective:  Monique Harris is a 27 y.o. [phone] at [redacted]w[redacted]d being seen today for ongoing prenatal care.  She is currently monitored for the following issues for this high-risk pregnancy and has History of cesarean delivery, currently pregnant; History of shoulder dystocia in prior pregnancy, currently pregnant in third trimester; History of placenta abruption; Supervision of high risk pregnancy, antepartum; Short interval between pregnancies affecting pregnancy, antepartum; Grand multiparity; Abnormal Pap smear of cervix; and Insufficient weight gain during pregnancy on their problem list.  Patient reports happy that she has gained her weight back. .  Contractions: Not present. Vag. Bleeding: None.  Movement: Present. Denies leaking of fluid.   The following portions of the patient's history were reviewed and updated as appropriate: allergies, current medications, past family history, past medical history, past social history, past surgical history and problem list. Problem list updated.  Objective:   Vitals:   02/15/18 1133  BP: 110/69  Pulse: 74  Weight: 156 lb 3.2 oz (70.9 kg)    Fetal Status: Fetal Heart Rate (bpm): 148 Fundal Height: 33 cm Movement: Present     General:  Alert, oriented and cooperative. Patient is in no acute distress.  Skin: Skin is warm and dry. No rash noted.   Cardiovascular: Normal heart rate noted  Respiratory: Normal respiratory effort, no problems with respiration noted  Abdomen: Soft, gravid, appropriate for gestational age.  Pain/Pressure: Present     Pelvic: Cervical exam deferred        Extremities: Normal range of motion.  Edema: None  Mental Status: Normal mood and affect. Normal behavior. Normal judgment and thought content.   Assessment and Plan:  Pregnancy: [id number] at [redacted]w[redacted]d  1. Supervision of high risk pregnancy, antepartum Doing well; no complaints.   Preterm labor symptoms and general obstetric precautions including  but not limited to vaginal bleeding, contractions, leaking of fluid and fetal movement were reviewed in detail with the patient. Please refer to After Visit Summary for other counseling recommendations.  Return in about 2 weeks (around 03/01/2018).  No future appointments.  Marylene Land, CNM

## 2018-03-01 ENCOUNTER — Ambulatory Visit (INDEPENDENT_AMBULATORY_CARE_PROVIDER_SITE_OTHER): Payer: Medicaid Other | Admitting: Student

## 2018-03-01 DIAGNOSIS — O0990 Supervision of high risk pregnancy, unspecified, unspecified trimester: Secondary | ICD-10-CM

## 2018-03-01 NOTE — Patient Instructions (Signed)
Vaginal Birth After Cesarean Delivery Vaginal birth after cesarean delivery (VBAC) is giving birth vaginally after previously delivering a baby by a cesarean. In the past, if a woman had a cesarean delivery, all births afterward would be done by cesarean delivery. This is no longer true. It can be safe for the mother to try a vaginal delivery after having a cesarean delivery. It is important to discuss VBAC with your health care provider early in the pregnancy so you can understand the risks, benefits, and options. It will give you time to decide what is best in your particular case. The final decision about whether to have a VBAC or repeat cesarean delivery should be between you and your health care provider. Any changes in your health or your baby's health during your pregnancy may make it necessary to change your initial decision about VBAC. Women who plan to have a VBAC should check with their health care provider to be sure that:  The previous cesarean delivery was done with a low transverse uterine cut (incision) (not a vertical classical incision).  The birth canal is big enough for the baby.  There were no other operations on the uterus.  An electronic fetal monitor (EFM) will be on at all times during labor.  An operating room will be available and ready in case an emergency cesarean delivery is needed.  A health care provider and surgical nursing staff will be available at all times during labor to be ready to do an emergency delivery cesarean if necessary.  An anesthesiologist will be present in case an emergency cesarean delivery is needed.  The nursery is prepared and has adequate personnel and necessary equipment available to care for the baby in case of an emergency cesarean delivery. Benefits of VBAC  Shorter stay in the hospital.  Avoidance of risks associated with cesarean delivery, such as: ? Surgical complications, such as opening of the incision or hernia in the  incision. ? Injury to other organs. ? Fever. This can occur if an infection develops after surgery. It can also occur as a reaction to the medicine given to make you numb during the surgery.  Less blood loss and need for blood transfusions.  Lower risk of blood clots and infection.  Shorter recovery.  Decreased risk for having to remove the uterus (hysterectomy).  Decreased risk for the placenta to completely or partially cover the opening of the uterus (placenta previa) with a future pregnancy.  Decrease risk in future labor and delivery. Risks of a VBAC  Tearing (rupture) of the uterus. This is occurs in less than 1% of VBACs. The risk of this happening is higher if: ? Steps are taken to begin the labor process (induce labor) or stimulate or strengthen contractions (augment labor). ? Medicine is used to soften (ripen) the cervix.  Having to remove the uterus (hysterectomy) if it ruptures. VBAC should not be done if:  The previous cesarean delivery was done with a vertical (classical) or T-shaped incision or you do not know what kind of incision was made.  You had a ruptured uterus.  You have had certain types of surgery on your uterus, such as removal of uterine fibroids. Ask your health care provider about other types of surgeries that prevent you from having a VBAC.  You have certain medical or childbirth (obstetrical) problems.  There are problems with the baby.  You have had two previous cesarean deliveries and no vaginal deliveries. Other facts to know about VBAC:  It   is safe to have an epidural anesthetic with VBAC.  It is safe to turn the baby from a breech position (attempt an external cephalic version).  It is safe to try a VBAC with twins.  VBAC may not be successful if your baby weights 8.8 lb (4 kg) or more. However, weight predictions are not always accurate and should not be used alone to decide if VBAC is right for you.  There is an increased failure rate  if the time between the cesarean delivery and VBAC is less than 19 months.  Your health care provider may advise against a VBAC if you have preeclampsia (high blood pressure, protein in the urine, and swelling of face and extremities).  VBAC is often successful if you previously gave birth vaginally.  VBAC is often successful when the labor starts spontaneously before the due date.  Delivering a baby through a VBAC is similar to having a normal spontaneous vaginal delivery. This information is not intended to replace advice given to you by your health care provider. Make sure you discuss any questions you have with your health care provider. Document Released: 11/20/2006 Document Revised: 11/05/2015 Document Reviewed: 12/27/2012 Elsevier Interactive Patient Education  2018 Elsevier Inc.  

## 2018-03-01 NOTE — Progress Notes (Signed)
   PRENATAL VISIT NOTE  Subjective:  Monique Harris is a 27 y.o. [phone] at 7257w5d being seen today for ongoing prenatal care.  She is currently monitored for the following issues for this low-risk pregnancy and has History of cesarean delivery, currently pregnant; History of shoulder dystocia in prior pregnancy, currently pregnant in third trimester; History of placenta abruption; Supervision of high risk pregnancy, antepartum; Short interval between pregnancies affecting pregnancy, antepartum; Grand multiparity; Abnormal Pap smear of cervix; and Insufficient weight gain during pregnancy on their problem list.  Patient reports sometimes she feels pressure in her bottom. .  Contractions: Irregular. Vag. Bleeding: None.  Movement: Present. Denies leaking of fluid.   The following portions of the patient's history were reviewed and updated as appropriate: allergies, current medications, past family history, past medical history, past social history, past surgical history and problem list. Problem list updated.  Objective:   Vitals:   03/01/18 1130  BP: 120/60  Pulse: 79  Weight: 156 lb 8 oz (71 kg)    Fetal Status: Fetal Heart Rate (bpm): 145 Fundal Height: 34 cm Movement: Present     General:  Alert, oriented and cooperative. Patient is in no acute distress.  Skin: Skin is warm and dry. No rash noted.   Cardiovascular: Normal heart rate noted  Respiratory: Normal respiratory effort, no problems with respiration noted  Abdomen: Soft, gravid, appropriate for gestational age.  Pain/Pressure: Present     Pelvic: Cervical exam deferred        Extremities: Normal range of motion.  Edema: None  Mental Status: Normal mood and affect. Normal behavior. Normal judgment and thought content.   Assessment and Plan:  Pregnancy: [id number] at 4157w5d  1. Supervision of high risk pregnancy, antepartum -Doing well; reviewed s/s of pre-term labor.  -Plan for cultures next week -No questions  Preterm  labor symptoms and general obstetric precautions including but not limited to vaginal bleeding, contractions, leaking of fluid and fetal movement were reviewed in detail with the patient. Please refer to After Visit Summary for other counseling recommendations.  Return in about 2 weeks (around 03/15/2018), or [facility].  No future appointments.  Marylene LandKathryn Lorraine Henryetta Corriveau, CNM

## 2018-03-16 ENCOUNTER — Ambulatory Visit (INDEPENDENT_AMBULATORY_CARE_PROVIDER_SITE_OTHER): Payer: Medicaid Other | Admitting: Student

## 2018-03-16 ENCOUNTER — Other Ambulatory Visit (HOSPITAL_COMMUNITY)
Admission: RE | Admit: 2018-03-16 | Discharge: 2018-03-16 | Disposition: A | Discharge status: Home / Self Care | Payer: Medicaid Other | Source: Ambulatory Visit | Attending: Student | Admitting: Student

## 2018-03-16 VITALS — BP 125/74 | HR 71 | Wt 157.6 lb

## 2018-03-16 DIAGNOSIS — B373 Candidiasis of vulva and vagina: Secondary | ICD-10-CM | POA: Insufficient documentation

## 2018-03-16 DIAGNOSIS — O34219 Maternal care for unspecified type scar from previous cesarean delivery: Secondary | ICD-10-CM

## 2018-03-16 DIAGNOSIS — B3731 Acute candidiasis of vulva and vagina: Secondary | ICD-10-CM

## 2018-03-16 DIAGNOSIS — B9689 Other specified bacterial agents as the cause of diseases classified elsewhere: Secondary | ICD-10-CM | POA: Diagnosis not present

## 2018-03-16 DIAGNOSIS — N760 Acute vaginitis: Secondary | ICD-10-CM

## 2018-03-16 DIAGNOSIS — O0990 Supervision of high risk pregnancy, unspecified, unspecified trimester: Secondary | ICD-10-CM

## 2018-03-16 DIAGNOSIS — O0993 Supervision of high risk pregnancy, unspecified, third trimester: Secondary | ICD-10-CM

## 2018-03-16 MED ORDER — TERCONAZOLE 0.8 % VA CREA: 1.0000 | TOPICAL_CREAM | Freq: Every day | VAGINAL | 0 refills | Status: DC

## 2018-03-16 NOTE — Patient Instructions (Signed)
Before Baby Comes Home  Before your baby arrives it is important to:   Have all of the supplies that you will need to care for your baby.   Know where to go if there is an emergency.   Discuss the baby's arrival with other family members.    What supplies will I need?    It is recommended that you have the following supplies:  Large Items   Crib.   Crib mattress.   Rear-facing infant car seat. If possible, have a trained professional check to make sure that it is installed correctly.    Feeding   6-8 bottles that are 4-5 oz in size.   6-8 nipples.   Bottle brush.   Sterilizer, or a large pan or kettle with a lid.   A way to boil and cool water.   If you will be breastfeeding:  ? Breast pump.  ? Nipple cream.  ? Nursing bra.  ? Breast pads.  ? Breast shields.   If you will be formula feeding:  ? Formula.  ? Measuring cups.  ? Measuring spoons.    Bathing   Mild baby soap and baby shampoo.   Petroleum jelly.   Soft cloth towel and washcloth.   Hooded towel.   Cotton balls.   Bath basin.    Other Supplies   Rectal thermometer.   Bulb syringe.   Baby wipes or washcloths for diaper changes.   Diaper bag.   Changing pad.   Clothing, including one-piece outfits and pajamas.   Baby nail clippers.   Receiving blankets.   Mattress pad and sheets for the crib.   Night-light for the baby's room.   Baby monitor.   2 or 3 pacifiers.   Either 24-36 cloth diapers and waterproof diaper covers or a box of disposable diapers. You may need to use as many as 10-12 diapers per day.    How do I prepare for an emergency?  Prepare for an emergency by:   Knowing how to get to the nearest hospital.   Listing the phone numbers of your baby's health care providers near your home phone and in your cell phone.    How do I prepare my family?   Decide how to handle visitors.   If you have other children:  ? Talk with them about the baby coming home. Ask them how they feel about it.  ? Read a book together about  being a new big brother or sister.  ? Find ways to let them help you prepare for the new baby.  ? Have someone ready to care for them while you are in the hospital.  This information is not intended to replace advice given to you by your health care provider. Make sure you discuss any questions you have with your health care provider.  Document Released: 05/12/2008 Document Revised: 11/05/2015 Document Reviewed: 05/07/2014  Elsevier Interactive Patient Education  2018 Elsevier Inc.

## 2018-03-16 NOTE — Progress Notes (Signed)
   PRENATAL VISIT NOTE  Subjective:  Monique Harris is a 27 y.o. [phone] at [redacted]w[redacted]d being seen today for ongoing prenatal care.  She is currently monitored for the following issues for this low-risk pregnancy and has History of cesarean delivery, currently pregnant; History of shoulder dystocia in prior pregnancy, currently pregnant in third trimester; History of placenta abruption; Supervision of high risk pregnancy, antepartum; Short interval between pregnancies affecting pregnancy, antepartum; Grand multiparity; Abnormal Pap smear of cervix; and Insufficient weight gain during pregnancy on their problem list.  Patient reports occasional contractions & vaginal discharge/irritation.  Contractions: Irregular. Vag. Bleeding: None.  Movement: Present. Denies leaking of fluid.   The following portions of the patient's history were reviewed and updated as appropriate: allergies, current medications, past family history, past medical history, past social history, past surgical history and problem list. Problem list updated.  Objective:   Vitals:   03/16/18 1022  BP: 125/74  Pulse: 71  Weight: 157 lb 9.6 oz (71.5 kg)    Fetal Status: Fetal Heart Rate (bpm): 155 Fundal Height: 35 cm Movement: Present  Presentation: Vertex  General:  Alert, oriented and cooperative. Patient is in no acute distress.  Skin: Skin is warm and dry. No rash noted.   Cardiovascular: Normal heart rate noted  Respiratory: Normal respiratory effort, no problems with respiration noted  Abdomen: Soft, gravid, appropriate for gestational age.  Pain/Pressure: Present     Pelvic: Cervical exam performed Dilation: 1 Effacement (%): 50 Station: -3  Clumpy white discharge in vagina & on bilateral labia  Extremities: Normal range of motion.  Edema: None  Mental Status: Normal mood and affect. Normal behavior. Normal judgment and thought content.   Assessment and Plan:  Pregnancy: [id number] at [redacted]w[redacted]d  1. Supervision of high risk  pregnancy, antepartum  - GC/Chlamydia probe amp ([place])not at [facility] - Culture, beta strep (group b only)  2. History of cesarean delivery, currently pregnant  - GC/Chlamydia probe amp ([place])not at [facility] - Culture, beta strep (group b only)  3. Vaginal yeast infection  - terconazole (TERAZOL 3) 0.8 % vaginal cream; Place 1 applicator vaginally at bedtime.  Dispense: 20 g; Refill: 0  4. Acute vaginitis  - Cervicovaginal ancillary only  Term labor symptoms and general obstetric precautions including but not limited to vaginal bleeding, contractions, leaking of fluid and fetal movement were reviewed in detail with the patient. Please refer to After Visit Summary for other counseling recommendations.  Return in about 1 week (around 03/23/2018) for Routine OB.  Future Appointments  Date Time Provider Department Center  03/28/2018  1:55 PM Burleson, Brand Males, NP [facility]    Judeth Horn, NP

## 2018-03-19 LAB — CERVICOVAGINAL ANCILLARY ONLY
BACTERIAL VAGINITIS: NEGATIVE
CANDIDA VAGINITIS: POSITIVE — AB
Chlamydia: NEGATIVE
NEISSERIA GONORRHEA: NEGATIVE
TRICH (WINDOWPATH): NEGATIVE

## 2018-03-20 LAB — CULTURE, BETA STREP (GROUP B ONLY): STREP GP B CULTURE: NEGATIVE

## 2018-03-21 ENCOUNTER — Other Ambulatory Visit: Payer: Self-pay

## 2018-03-21 DIAGNOSIS — R12 Heartburn: Principal | ICD-10-CM

## 2018-03-21 DIAGNOSIS — O26892 Other specified pregnancy related conditions, second trimester: Secondary | ICD-10-CM

## 2018-03-21 MED ORDER — RANITIDINE HCL 150 MG PO TABS: 150.0000 mg | ORAL_TABLET | Freq: Two times a day (BID) | ORAL | 1 refills | Status: DC

## 2018-03-21 NOTE — Telephone Encounter (Signed)
Pt sent my chart message to have her Zantac refilled. Sent to pts pharmacy and messaged pt that Rx was sent.

## 2018-03-28 ENCOUNTER — Ambulatory Visit (INDEPENDENT_AMBULATORY_CARE_PROVIDER_SITE_OTHER): Payer: Medicaid Other | Admitting: Nurse Practitioner

## 2018-03-28 VITALS — BP 122/78 | HR 79 | Wt 158.0 lb

## 2018-03-28 DIAGNOSIS — O0990 Supervision of high risk pregnancy, unspecified, unspecified trimester: Secondary | ICD-10-CM

## 2018-03-28 NOTE — Progress Notes (Signed)
    Subjective:  Monique Harris is a 27 y.o. [phone] at [redacted]w[redacted]d being seen today for ongoing prenatal care.  She is currently monitored for the following issues for this low-risk pregnancy and has History of cesarean delivery, currently pregnant; History of shoulder dystocia in prior pregnancy, currently pregnant in third trimester; History of placenta abruption; Supervision of high risk pregnancy, antepartum; Short interval between pregnancies affecting pregnancy, antepartum; Grand multiparity; Abnormal Pap smear of cervix; and Insufficient weight gain during pregnancy on their problem list.  Patient reports having some braxton hicks contractions and having some brown discharge when wiping only.  Contractions: Irregular. Vag. Bleeding: Scant.  Movement: Present. Denies leaking of fluid.  Reports feeling better since using the yeast cream.  The following portions of the patient's history were reviewed and updated as appropriate: allergies, current medications, past family history, past medical history, past social history, past surgical history and problem list. Problem list updated.  Objective:   Vitals:   03/28/18 1407  BP: 122/78  Pulse: 79  Weight: 158 lb (71.7 kg)    Fetal Status: Fetal Heart Rate (bpm): 145 Fundal Height: 37 cm Movement: Present     General:  Alert, oriented and cooperative. Patient is in no acute distress.  Skin: Skin is warm and dry. No rash noted.   Cardiovascular: Normal heart rate noted  Respiratory: Normal respiratory effort, no problems with respiration noted  Abdomen: Soft, gravid, appropriate for gestational age. Pain/Pressure: Present     Pelvic:  Cervical exam deferred        Extremities: Normal range of motion.  Edema: None  Mental Status: Normal mood and affect. Normal behavior. Normal judgment and thought content.   Urinalysis:      Assessment and Plan:  Pregnancy: [id number] at [redacted]w[redacted]d  1. Supervision of high risk pregnancy, antepartum Advised that  brown discharge may be from intercourse a few days ago or beginning labor.  Advised her to come to [facility] if she is having red blood when wiping or if the baby is not moving well.  Term labor symptoms and general obstetric precautions including but not limited to vaginal bleeding, contractions, leaking of fluid and fetal movement were reviewed in detail with the patient. Please refer to After Visit Summary for other counseling recommendations.  Return in about 1 week (around 04/04/2018).  Nolene Bernheim, RN, MSN, NP-BC Nurse Practitioner, [facility] for [employer], [facility] 03/28/2018 2:16 PM

## 2018-03-30 ENCOUNTER — Encounter (HOSPITAL_COMMUNITY): Payer: Self-pay

## 2018-03-30 ENCOUNTER — Inpatient Hospital Stay (HOSPITAL_COMMUNITY)
Admission: AD | Admit: 2018-03-30 | Discharge: 2018-04-01 | DRG: 807 | Disposition: A | Discharge status: Home / Self Care | Payer: Medicaid Other | Attending: Obstetrics and Gynecology | Admitting: Obstetrics and Gynecology

## 2018-03-30 DIAGNOSIS — O9902 Anemia complicating childbirth: Secondary | ICD-10-CM | POA: Diagnosis present

## 2018-03-30 DIAGNOSIS — Z3A38 38 weeks gestation of pregnancy: Secondary | ICD-10-CM

## 2018-03-30 DIAGNOSIS — O34219 Maternal care for unspecified type scar from previous cesarean delivery: Secondary | ICD-10-CM | POA: Diagnosis present

## 2018-03-30 DIAGNOSIS — D649 Anemia, unspecified: Secondary | ICD-10-CM | POA: Diagnosis present

## 2018-03-30 DIAGNOSIS — F17210 Nicotine dependence, cigarettes, uncomplicated: Secondary | ICD-10-CM | POA: Diagnosis present

## 2018-03-30 DIAGNOSIS — Z3483 Encounter for supervision of other normal pregnancy, third trimester: Secondary | ICD-10-CM | POA: Diagnosis present

## 2018-03-30 DIAGNOSIS — O99334 Smoking (tobacco) complicating childbirth: Secondary | ICD-10-CM | POA: Diagnosis present

## 2018-03-30 MED ORDER — MISOPROSTOL 200 MCG PO TABS
ORAL_TABLET | ORAL | Status: AC
  Administered 2018-03-30: 1000 ug
  Filled 2018-03-30: qty 5

## 2018-03-30 MED ORDER — IBUPROFEN 600 MG PO TABS
600.0000 mg | ORAL_TABLET | Freq: Four times a day (QID) | ORAL | Status: DC
  Administered 2018-03-31 – 2018-04-01 (×7): 600 mg via ORAL
  Filled 2018-03-30 (×8): qty 1

## 2018-03-30 MED ORDER — OXYTOCIN 40 UNITS IN LACTATED RINGERS INFUSION - SIMPLE MED
INTRAVENOUS | Status: AC
  Filled 2018-03-30: qty 1000

## 2018-03-30 MED ORDER — LIDOCAINE HCL (PF) 1 % IJ SOLN
INTRAMUSCULAR | Status: AC
  Filled 2018-03-30: qty 30

## 2018-03-30 MED ORDER — OXYTOCIN 10 UNIT/ML IJ SOLN
INTRAMUSCULAR | Status: AC
  Filled 2018-03-30: qty 1

## 2018-03-30 NOTE — MAU Note (Signed)
C/o labor wanting to push. SVE 10/100/+1.

## 2018-03-30 NOTE — H&P (Signed)
OBSTETRIC ADMISSION HISTORY AND PHYSICAL  Monique Harris is a 27 y.o. female [phone] with IUP at [redacted]w[redacted]d by 5 week [place] presenting for labor. She reports +FMs, No LOF, no VB, no blurry vision, headaches or peripheral edema, and RUQ pain.  She plans on breast feeding. She request depo for birth control.  She received her prenatal care at [facility]   Dating: By 5 week [place] --->  Estimated Date of Delivery: 04/07/18  Sono:  normal anatomy  Prenatal History/Complications:  Past Medical History: Past Medical History:  Diagnosis Date  . Abnormal Pap smear     Past Surgical History: Past Surgical History:  Procedure Laterality Date  . CESAREAN SECTION  01/02/2012   Procedure: CESAREAN SECTION;  Surgeon: Tilda Burrow, MD;  Location: [facility];  Service: Gynecology;  Laterality: N/A;  Primary cesarean section of baby boy ay 0125 APGAR 8/9. NRFHT, Abruption=5%    Obstetrical History: OB History    Gravida  9   Para  5   Term  5   Preterm  0   AB  3   Living  5     SAB  1   TAB  2   Ectopic  0   Multiple  0   Live Births  5           Social History: Social History   Socioeconomic History  . Marital status: Single    Spouse name: Not on file  . Number of children: Not on file  . Years of education: Not on file  . Highest education level: Not on file  Occupational History  . Not on file  Social Needs  . Financial resource strain: Not on file  . Food insecurity:    Worry: Not on file    Inability: Not on file  . Transportation needs:    Medical: Not on file    Non-medical: Not on file  Tobacco Use  . Smoking status: Current Some Day Smoker    Packs/day: 0.25    Types: Cigarettes    Last attempt to quit: 05/12/2011    Years since quitting: 6.8  . Smokeless tobacco: Never Used  Substance and Sexual Activity  . Alcohol use: No  . Drug use: No  . Sexual activity: Not Currently    Birth control/protection: None  Lifestyle  . Physical activity:    Days per  week: Not on file    Minutes per session: Not on file  . Stress: Not on file  Relationships  . Social connections:    Talks on phone: Not on file    Gets together: Not on file    Attends religious service: Not on file    Active member of club or organization: Not on file    Attends meetings of clubs or organizations: Not on file    Relationship status: Not on file  Other Topics Concern  . Not on file  Social History Narrative  . Not on file    Family History: Family History  Problem Relation Age of Onset  . Anesthesia problems Neg Hx   . Other Neg Hx     Allergies: No Known Allergies  Medications Prior to Admission  Medication Sig Dispense Refill Last Dose  . acetaminophen (TYLENOL) 500 MG tablet Take 500 mg by mouth every 6 (six) hours as needed for mild pain or headache.    Taking  . metoCLOPramide (REGLAN) 10 MG tablet Take 1 tablet (10 mg total) by mouth 4 (  four) times daily -  before meals and at bedtime. 90 tablet 0 Taking  . pantoprazole (PROTONIX) 40 MG tablet Take 1 tablet (40 mg total) by mouth daily. 30 tablet 1 Taking  . Prenatal Vit-Fe Fumarate-FA (MULTIVITAMIN-PRENATAL) 27-0.8 MG TABS tablet Take 1 tablet by mouth daily at 12 noon. 30 tablet 11 Taking  . ranitidine (ZANTAC) 150 MG tablet Take 1 tablet (150 mg total) by mouth 2 (two) times daily. 60 tablet 1   . terconazole (TERAZOL 3) 0.8 % vaginal cream Place 1 applicator vaginally at bedtime. 20 g 0      Review of Systems   All systems reviewed and negative except as stated in HPI  Blood pressure (!) 145/96, pulse (!) 108, temperature 98.3 F (36.8 C), temperature source Oral, resp. rate 20, last menstrual period 03/20/2017, unknown if currently breastfeeding. General appearance: alert, cooperative and moderate distress Lungs: clear to auscultation bilaterally Heart: regular rate and rhythm Abdomen: soft, non-tender; bowel sounds normal Pelvic: complete/complete/+2 Extremities: Homans sign is negative,  no sign of DVT DTR's not assessed Presentation: cephalic Fetal monitoring: Not assessed Dilation: 10 Effacement (%): 100 Station: 0 Exam by:: K.Wilson,RN   Prenatal labs: ABO, Rh: O/Positive/-- (04/04 2130) Antibody: Negative (04/04 0951) Rubella: 16.20 (04/04 0951) RPR: Non Reactive (08/13 0823)  HBsAg: Negative (04/04 0951)  HIV: Non Reactive (08/13 0823)  GBS:   negative 1 hr Glucola nl Genetic screening  nl Anatomy US nl  Prenatal Transfer Tool  Maternal Diabetes: No Genetic Screening: Normal Maternal Ultrasounds/Referrals: Normal Fetal Ultrasounds or other Referrals:  None Maternal Substance Abuse:  No Significant Maternal Medications:  Meds include: Zantac Nexium Significant Maternal Lab Results: None  No results found for this or any previous visit (from the past 24 hour(s)).  Patient Active Problem List   Diagnosis Date Noted  . Insufficient weight gain during pregnancy 01/23/2018  . Abnormal Pap smear of cervix 10/17/2017  . Supervision of high risk pregnancy, antepartum 09/14/2017  . Short interval between pregnancies affecting pregnancy, antepartum 09/14/2017  . Grand multiparity 09/14/2017  . History of cesarean delivery, currently pregnant 10/11/2016  . History of shoulder dystocia in prior pregnancy, currently pregnant in third trimester 10/11/2016  . History of placenta abruption 10/11/2016    Assessment/Plan:  Monique Harris is a 27 y.o. [id number] at [redacted]w[redacted]d here for labor; precipitous delivery, see separate note.  #Labor: expectant management #Pain: none #FWB: Not fully assessed prior to birth, good apgars #ID:  GBS negative #MOF: bottle #MOC:depo #Circ:  N/a #VBAC successful #LSIL: needs PP colpo  Monique Vezina L, MD  03/30/2018, 11:42 PM

## 2018-03-31 ENCOUNTER — Encounter (HOSPITAL_COMMUNITY): Payer: Self-pay | Admitting: *Deleted

## 2018-03-31 LAB — CBC
HCT: 25.5 % — ABNORMAL LOW (ref 36.0–46.0)
Hemoglobin: 8.5 g/dL — ABNORMAL LOW (ref 12.0–15.0)
MCH: 26.2 pg (ref 26.0–34.0)
MCHC: 33.3 g/dL (ref 30.0–36.0)
MCV: 78.7 fL — ABNORMAL LOW (ref 80.0–100.0)
NRBC: 0 % (ref 0.0–0.2)
PLATELETS: 247 10*3/uL (ref 150–400)
RBC: 3.24 MIL/uL — ABNORMAL LOW (ref 3.87–5.11)
RDW: 14.8 % (ref 11.5–15.5)
WBC: 16.3 10*3/uL — AB (ref 4.0–10.5)

## 2018-03-31 LAB — RPR: RPR: NONREACTIVE

## 2018-03-31 MED ORDER — SENNOSIDES-DOCUSATE SODIUM 8.6-50 MG PO TABS
2.0000 | ORAL_TABLET | ORAL | Status: DC
  Administered 2018-03-31 (×2): 2 via ORAL
  Filled 2018-03-31 (×2): qty 2

## 2018-03-31 MED ORDER — SIMETHICONE 80 MG PO CHEW: 80.0000 mg | CHEWABLE_TABLET | ORAL | Status: DC | PRN

## 2018-03-31 MED ORDER — ONDANSETRON HCL 4 MG PO TABS: 4.0000 mg | ORAL_TABLET | ORAL | Status: DC | PRN

## 2018-03-31 MED ORDER — BENZOCAINE-MENTHOL 20-0.5 % EX AERO: 1.0000 "application " | INHALATION_SPRAY | CUTANEOUS | Status: DC | PRN

## 2018-03-31 MED ORDER — FAMOTIDINE 20 MG PO TABS
20.0000 mg | ORAL_TABLET | Freq: Two times a day (BID) | ORAL | Status: DC
  Administered 2018-03-31 – 2018-04-01 (×3): 20 mg via ORAL
  Filled 2018-03-31 (×3): qty 1

## 2018-03-31 MED ORDER — COCONUT OIL OIL: 1.0000 "application " | TOPICAL_OIL | Status: DC | PRN

## 2018-03-31 MED ORDER — TETANUS-DIPHTH-ACELL PERTUSSIS 5-2.5-18.5 LF-MCG/0.5 IM SUSP: 0.5000 mL | Freq: Once | INTRAMUSCULAR | Status: DC

## 2018-03-31 MED ORDER — ONDANSETRON HCL 4 MG/2ML IJ SOLN: 4.0000 mg | INTRAMUSCULAR | Status: DC | PRN

## 2018-03-31 MED ORDER — WITCH HAZEL-GLYCERIN EX PADS: 1.0000 "application " | MEDICATED_PAD | CUTANEOUS | Status: DC | PRN

## 2018-03-31 MED ORDER — DIPHENHYDRAMINE HCL 25 MG PO CAPS: 25.0000 mg | ORAL_CAPSULE | Freq: Four times a day (QID) | ORAL | Status: DC | PRN

## 2018-03-31 MED ORDER — PANTOPRAZOLE SODIUM 40 MG PO TBEC
40.0000 mg | DELAYED_RELEASE_TABLET | Freq: Every day | ORAL | Status: DC
  Administered 2018-04-01: 40 mg via ORAL
  Filled 2018-03-31: qty 1

## 2018-03-31 MED ORDER — DIBUCAINE 1 % RE OINT: 1.0000 "application " | TOPICAL_OINTMENT | RECTAL | Status: DC | PRN

## 2018-03-31 MED ORDER — ACETAMINOPHEN 325 MG PO TABS
650.0000 mg | ORAL_TABLET | ORAL | Status: DC | PRN
  Administered 2018-03-31 – 2018-04-01 (×3): 650 mg via ORAL
  Filled 2018-03-31 (×3): qty 2

## 2018-03-31 MED ORDER — PRENATAL MULTIVITAMIN CH
1.0000 | ORAL_TABLET | Freq: Every day | ORAL | Status: DC
  Administered 2018-04-01: 1 via ORAL
  Filled 2018-03-31: qty 1

## 2018-03-31 MED ORDER — ZOLPIDEM TARTRATE 5 MG PO TABS: 5.0000 mg | ORAL_TABLET | Freq: Every evening | ORAL | Status: DC | PRN

## 2018-03-31 NOTE — Progress Notes (Signed)
Post Partum Day 1 Subjective: no complaints, up ad lib, voiding, tolerating PO and + flatus  Objective: Blood pressure 107/76, pulse 75, temperature 98.2 F (36.8 C), temperature source Oral, resp. rate 15, height 5' 2.99" (1.6 m), weight 71.6 kg, last menstrual period 03/20/2017, SpO2 100 %, unknown if currently breastfeeding.  Physical Exam:  General: alert, cooperative, appears stated age and no distress Lochia: appropriate Uterine Fundus: firm Incision: NA DVT Evaluation: No evidence of DVT seen on physical exam.  Recent Labs    03/31/18 0412  HGB 8.5*  HCT 25.5*    Assessment/Plan: Plan for discharge tomorrow   LOS: 1 day   Gwenevere Abbot 03/31/2018, 4:01 PM

## 2018-04-01 MED ORDER — IBUPROFEN 600 MG PO TABS: 600.0000 mg | ORAL_TABLET | Freq: Four times a day (QID) | ORAL | 0 refills | Status: DC | PRN

## 2018-04-01 MED ORDER — SENNOSIDES-DOCUSATE SODIUM 8.6-50 MG PO TABS: 2.0000 | ORAL_TABLET | ORAL | 0 refills | Status: DC

## 2018-04-01 MED ORDER — FERROUS SULFATE 325 (65 FE) MG PO TABS: 325.0000 mg | ORAL_TABLET | Freq: Two times a day (BID) | ORAL | 0 refills | Status: DC

## 2018-04-01 NOTE — Discharge Summary (Addendum)
OB Discharge Summary     Patient Name: Monique Harris DOB: Aug 18, 1990 MRN: [id number]  Date of admission: 03/30/2018 Delivering MD: [name]   Date of discharge: 04/01/2018  Admitting diagnosis: 38wks ctx labor Intrauterine pregnancy: [redacted]w[redacted]d     Secondary diagnosis:  Active Problems:   Precipitous delivery  Additional problems: Successful VBAC with precipitous delivery, abnormal pap smear (LSIL 10/2017 and 2018). Patient will need a post-partum colposcopy.          Discharge diagnosis: Term Pregnancy Delivered, VBAC and Anemia                                                                                                Post partum procedures:None   Augmentation: None   Complications: None  Hospital course:  Onset of Labor With Vaginal Delivery     27 y.o. yo [id number] at [redacted]w[redacted]d was admitted in Active Labor on 03/30/2018. Patient had an uncomplicated labor course as follows:  Membrane Rupture Time/Date: 11:24 PM ,03/30/2018   Intrapartum Procedures: Episiotomy: None [1]                                         Lacerations:  None [1]  Patient had a delivery of a Viable infant. 03/30/2018  Information for the patient's newborn:  Zylpha, Poynor [[id number]]  Delivery Method: Vag-Spont    Pateint had an uncomplicated postpartum course.  She is ambulating, tolerating a regular diet, passing flatus, and urinating well. Patient is discharged home in stable condition on 04/01/18.   Physical exam  Vitals:   03/31/18 0814 03/31/18 1557 03/31/18 2304 04/01/18 0635  BP: 116/85 107/76 112/73 120/78  Pulse: 88 75 81 (!) 53  Resp:  15 18 18   Temp: 98.9 F (37.2 C) 98.2 F (36.8 C) 98.2 F (36.8 C) 98.1 F (36.7 C)  TempSrc: Oral Oral Oral Oral  SpO2: 100%   99%  Weight:      Height:       General: alert, cooperative and no distress Lochia: appropriate Uterine Fundus: firm Incision: N/A DVT Evaluation: No evidence of DVT seen on physical exam. No significant  calf/ankle edema. Labs: Lab Results  Component Value Date   WBC 16.3 (H) 03/31/2018   HGB 8.5 (L) 03/31/2018   HCT 25.5 (L) 03/31/2018   MCV 78.7 (L) 03/31/2018   PLT 247 03/31/2018   CMP Latest Ref Rng & Units 11/26/2011  Glucose 70 - 99 mg/dL 92  BUN 6 - 23 mg/dL 7  Creatinine 1.30 - 8.65 mg/dL 7.84  Sodium 696 - 295 mEq/L 138  Potassium 3.5 - 5.1 mEq/L 3.6  Chloride 96 - 112 mEq/L 103  CO2 19 - 32 mEq/L -  Calcium 8.4 - 10.5 mg/dL -  Total Protein 6.0 - 8.3 g/dL -  Total Bilirubin 0.3 - 1.2 mg/dL -  Alkaline Phos 39 - 284 U/L -  AST 0 - 37 U/L -  ALT 0 - 35 U/L -    Discharge instruction:  per After Visit Summary and "Baby and Me Booklet".  After visit meds:  Allergies as of 04/01/2018   No Known Allergies     Medication List    STOP taking these medications   terconazole 0.8 % vaginal cream Commonly known as:  TERAZOL 3     TAKE these medications   ferrous sulfate 325 (65 FE) MG tablet Take 1 tablet (325 mg total) by mouth 2 (two) times daily with a meal.   ibuprofen 600 MG tablet Commonly known as:  ADVIL,MOTRIN Take 1 tablet (600 mg total) by mouth every 6 (six) hours as needed for moderate pain.   metoCLOPramide 10 MG tablet Commonly known as:  REGLAN Take 1 tablet (10 mg total) by mouth 4 (four) times daily -  before meals and at bedtime.   multivitamin-prenatal 27-0.8 MG Tabs tablet Take 1 tablet by mouth daily at 12 noon.   pantoprazole 40 MG tablet Commonly known as:  PROTONIX Take 1 tablet (40 mg total) by mouth daily.   ranitidine 150 MG tablet Commonly known as:  ZANTAC Take 1 tablet (150 mg total) by mouth 2 (two) times daily.   senna-docusate 8.6-50 MG tablet Commonly known as:  Senokot-S Take 2 tablets by mouth daily.       Diet: routine diet  Activity: Advance as tolerated. Pelvic rest for 6 weeks.   Outpatient follow up:4 weeks Follow up Appt: No future appointments. Follow up Visit:No follow-ups on file. Will need a  post-partum colposcopy.     Postpartum contraception: Depo Provera  Newborn Data: Live born female  Birth Weight: 6 lb 2 oz (2778 g) APGAR: 9, 9  Newborn Delivery   Birth date/time:  03/30/2018 23:25:00 Delivery type:  VBAC, Spontaneous     Baby Feeding: Bottle Disposition:home with mother   04/01/2018 [name], DO Family Medicine PGY-1   I confirm that I have verified the information documented in the resident's note and that I have also personally reperformed the physical exam and all medical decision making activities.   Raelyn Mora, CNM  04/01/2018 1:07 PM

## 2018-04-03 ENCOUNTER — Telehealth: Payer: Self-pay | Admitting: Family Medicine

## 2018-04-03 NOTE — Telephone Encounter (Signed)
Sending a MyChart message

## 2018-04-03 NOTE — Telephone Encounter (Signed)
-----   Message from Bobbe Medico sent at 04/01/2018  6:46 AM EDT ----- Regarding: DELIVERY WOC  VAGINAL PLEASE SCHEDULE PP

## 2018-04-04 ENCOUNTER — Encounter: Payer: Medicaid Other | Admitting: Obstetrics and Gynecology

## 2018-04-05 ENCOUNTER — Encounter: Payer: Medicaid Other | Admitting: Obstetrics & Gynecology

## 2018-05-08 ENCOUNTER — Ambulatory Visit (INDEPENDENT_AMBULATORY_CARE_PROVIDER_SITE_OTHER): Payer: Medicaid Other | Admitting: Student

## 2018-05-08 DIAGNOSIS — Z3202 Encounter for pregnancy test, result negative: Secondary | ICD-10-CM

## 2018-05-08 DIAGNOSIS — Z1389 Encounter for screening for other disorder: Secondary | ICD-10-CM | POA: Diagnosis not present

## 2018-05-08 DIAGNOSIS — Z3042 Encounter for surveillance of injectable contraceptive: Secondary | ICD-10-CM

## 2018-05-08 LAB — POCT PREGNANCY, URINE: Preg Test, Ur: NEGATIVE

## 2018-05-08 MED ORDER — MEDROXYPROGESTERONE ACETATE 150 MG/ML IM SUSP: 150.0000 mg | Freq: Once | INTRAMUSCULAR | Status: DC

## 2018-05-08 NOTE — Patient Instructions (Signed)
Colposcopy  Colposcopy is a procedure to examine the lowest part of the uterus (cervix), the vagina, and the area around the vaginal opening (vulva) for abnormalities or signs of disease. The procedure is done using a lighted microscope or magnifying lens (colposcope). If any unusual cells are found during the procedure, your health care provider may remove a tissue sample for testing (biopsy). A colposcopy may be done if you:   Have an abnormal Pap test. A Pap test is a screening test that is used to check for signs of cancer or infection of the vagina, cervix, and uterus.   Have a Pap smear test in which you test positive for high-risk HPV (human papillomavirus).   Have a sore or lesion on your cervix.   Have genital warts on your vulva, vagina, or cervix.   Took certain medicines while pregnant, such as diethylstilbestrol (DES).   Have pain during sexual intercourse.   Have vaginal bleeding, especially after sexual intercourse.   Need to have a cervical polyp removed.   Need to have a lost intrauterine device (IUD) string located.    Let your health care provider know about:   Any allergies you have, including allergies to prescribed medicine, latex, or iodine.   All medicines you are taking, including vitamins, herbs, eye drops, creams, and over-the-counter medicines. Bring a list of all of your medicines to your appointment.   Any problems you or family members have had with anesthetic medicines.   Any blood disorders you have.   Any surgeries you have had.   Any medical conditions you have, such as pelvic inflammatory disease (PID) or endometrial disorder.   Any history of frequent fainting.   Your menstrual cycle and what form of birth control (contraception) you use.   Your medical history, including any prior cervical treatment.   Whether you are pregnant or may be pregnant.  What are the risks?  Generally, this is a safe procedure. However, problems may occur,  including:   Pain.   Infection, which may include a fever, bad-smelling discharge, or pelvic pain.   Bleeding or discharge.   Misdiagnosis.   Fainting and vasovagal reactions, but this is rare.   Allergic reactions to medicines.   Damage to other structures or organs.    What happens before the procedure?   If you have your menstrual period or will have it at the time of your procedure, tell your health care provider. A colposcopy typically is not done during menstruation.   Continue your contraceptive practices before and after the procedure.   For 24 hours before the colposcopy:  ? Do not douche.  ? Do not use tampons.  ? Do not use medicines, creams, or suppositories in the vagina.  ? Do not have sexual intercourse.   Ask your health care provider about:  ? Changing or stopping your regular medicines. This is especially important if you are taking diabetes medicines or blood thinners.  ? Taking medicines such as aspirin and ibuprofen. These medicines can thin your blood. Do not take these medicines before your procedure if your health care provider instructs you not to. It is likely that your health care provider will tell you to avoid taking aspirin or medicine that contains aspirin for 7 days before the procedure.   Follow instructions from your health care provider about eating or drinking restrictions. You will likely need to eat a regular diet the day of the procedure and not skip any meals.   You   may have an exam or testing. A pregnancy test will be taken on the day of the procedure.   You may have a blood or urine sample taken.   Plan to have someone take you home from the hospital or clinic.   If you will be going home right after the procedure, plan to have someone with you for 24 hours.  What happens during the procedure?   You will lie down on your back, with your feet in foot rests (stirrups).   A warmed and lubricated instrument (speculum) will be inserted into your vagina. The  speculum will be used to hold apart the walls of your vagina so your health care provider can see your cervix and the inside of your vagina.   A cotton swab will be used to place a small amount of liquid solution on the areas to be examined. This solution makes it easier to see abnormal cells. You may feel a slight burning during this part.   The colposcope will be used to scan the cervix with a bright white light. The colposcope will be held near your vulvaand will magnify your vulva, vagina, and cervix for easier examination.   Your health care provider may decide to take a biopsy. If so:  ? You may be given medicine to numb the area (local anesthetic).  ? Surgical instruments will be used to suck out mucus and cells through your vagina.  ? You may feel mild pain while the tissue sample is removed.  ? Bleeding may occur. A solution may be used to stop the bleeding.  ? If a sample of tissue is needed from the inside of the cervix, a different procedure called endocervical curettage (ECC) may be completed. During this procedure, a curved instrument (curette) will be used to scrape cells from your cervix or the top of your cervix (endocervix).   Your health care provider will record the location of any abnormalities.  The procedure may vary among health care providers and hospitals.  What happens after the procedure?   You will lie down and rest for a few minutes. You may be offered juice or cookies.   Your blood pressure, heart rate, breathing rate, and blood oxygen level will be monitored until any medicines you were given have worn off.   You may have to wear compression stockings. These stockings help to prevent blood clots and reduce swelling in your legs.   You may have some cramping in your abdomen. This should go away after a few minutes.  This information is not intended to replace advice given to you by your health care provider. Make sure you discuss any questions you have with your health care  provider.  Document Released: 08/20/2002 Document Revised: 01/26/2016 Document Reviewed: 01/04/2016  Elsevier Interactive Patient Education  2018 [employer]

## 2018-05-08 NOTE — Progress Notes (Signed)
Subjective:     Monique Harris is a 27 y.o. female who presents for a postpartum visit. She is 5 weeks postpartum following a spontaneous vaginal delivery. I have fully reviewed the prenatal and intrapartum course. The delivery was at 38 gestational weeks. Outcome: spontaneous vaginal delivery. Anesthesia: none. Postpartum course has been uneventful Baby's course has been uneventful Baby is feeding by bottle - Lucien MonsGerber Good Start Gentle. Bleeding moderate lochia. Bowel function is normal. Bladder function is normal. Patient is sexually active. Contraception method is Depo-Provera injections and OCP (estrogen/progesterone). Postpartum depression screening: negative.  The following portions of the patient's history were reviewed and updated as appropriate: allergies, current medications, past family history, past medical history, past social history, past surgical history and problem list.  Review of Systems Pertinent items are noted in HPI.   Objective:    LMP 03/20/2017   General:  no distress   Breasts:  inspection negative, no nipple discharge or bleeding, no masses or nodularity palpable  Lungs: clear to auscultation bilaterally  Heart:  regular rate and rhythm, S1, S2 normal, no murmur, click, rub or gallop  Abdomen: soft, non-tender; bowel sounds normal; no masses,  no organomegaly   Vulva:  not evaluated  Vagina: not evaluated  Cervix:  not evaluated  Corpus: not examined  Adnexa:  not evaluated  Rectal Exam: Not performed.        Assessment:    Healthy postpartum exam. Pap smear not done at today's visit.   Plan:    1. Contraception: Depo-Provera injections 2. Has not been using protection and has been sexually active over the past week; will do pregnancy test today and come back for colpo and Depo shot next week. Counseled patient on importance of using barrier method until she can get her Depo Shot; patient understands.  3. Follow up in: 1 weeks or as needed.

## 2018-05-21 ENCOUNTER — Ambulatory Visit (INDEPENDENT_AMBULATORY_CARE_PROVIDER_SITE_OTHER): Payer: Medicaid Other | Admitting: Obstetrics & Gynecology

## 2018-05-21 ENCOUNTER — Encounter: Payer: Self-pay | Admitting: Obstetrics & Gynecology

## 2018-05-21 ENCOUNTER — Other Ambulatory Visit (HOSPITAL_COMMUNITY)
Admission: RE | Admit: 2018-05-21 | Discharge: 2018-05-21 | Disposition: A | Discharge status: Home / Self Care | Payer: Medicaid Other | Source: Ambulatory Visit | Attending: Obstetrics & Gynecology | Admitting: Obstetrics & Gynecology

## 2018-05-21 VITALS — BP 120/77 | HR 90 | Ht 63.0 in | Wt 146.8 lb

## 2018-05-21 DIAGNOSIS — N870 Mild cervical dysplasia: Secondary | ICD-10-CM | POA: Diagnosis not present

## 2018-05-21 DIAGNOSIS — Z23 Encounter for immunization: Secondary | ICD-10-CM | POA: Diagnosis not present

## 2018-05-21 DIAGNOSIS — R87612 Low grade squamous intraepithelial lesion on cytologic smear of cervix (LGSIL): Secondary | ICD-10-CM | POA: Insufficient documentation

## 2018-05-21 DIAGNOSIS — Z3009 Encounter for other general counseling and advice on contraception: Secondary | ICD-10-CM

## 2018-05-21 DIAGNOSIS — Z0000 Encounter for general adult medical examination without abnormal findings: Secondary | ICD-10-CM

## 2018-05-21 LAB — POCT PREGNANCY, URINE: Preg Test, Ur: NEGATIVE

## 2018-05-21 NOTE — Progress Notes (Signed)
   Subjective:    Patient ID: Monique Harris, female    DOB: [birth year], 27 y.o.   MRN: [id number]  HPI  27 yo P6 here for depo provera injection and colpo. She has had LGSIL on a pap 1/18 and 5/19. She has not had a colpo yet.  She does want more kids, wants to wait a few years.  She has had unprotected sex last night, partner refuses condoms, does use withdrawal.   Review of Systems     Objective:   Physical Exam  UPT negative, consent signed, time out done Cervix prepped with acetic acid. Transformation zone seen in its entirety. Colpo adequate. colpo entirely normal ECC obtained. She tolerated the procedure well.     Assessment & Plan:  Contraception- stressed that we cannot prescribe birth control today since she is having unprotected IC LGSIL on pap Start gardasil today Come back for 2nd shot in 2 months Come back in 2 weeks for some form of contraception

## 2018-08-05 ENCOUNTER — Emergency Department (HOSPITAL_COMMUNITY)
Admission: EM | Admit: 2018-08-05 | Discharge: 2018-08-05 | Disposition: A | Discharge status: Home / Self Care | Payer: Medicaid Other | Attending: Emergency Medicine | Admitting: Emergency Medicine

## 2018-08-05 ENCOUNTER — Encounter (HOSPITAL_COMMUNITY): Payer: Self-pay

## 2018-08-05 DIAGNOSIS — K0889 Other specified disorders of teeth and supporting structures: Secondary | ICD-10-CM

## 2018-08-05 DIAGNOSIS — F17210 Nicotine dependence, cigarettes, uncomplicated: Secondary | ICD-10-CM | POA: Insufficient documentation

## 2018-08-05 DIAGNOSIS — K047 Periapical abscess without sinus: Secondary | ICD-10-CM | POA: Insufficient documentation

## 2018-08-05 MED ORDER — KETOROLAC TROMETHAMINE 15 MG/ML IJ SOLN: 15.0000 mg | Freq: Once | INTRAMUSCULAR | Status: DC

## 2018-08-05 MED ORDER — KETOROLAC TROMETHAMINE 15 MG/ML IJ SOLN
15.0000 mg | Freq: Once | INTRAMUSCULAR | Status: AC
  Administered 2018-08-05: 15 mg via INTRAMUSCULAR
  Filled 2018-08-05: qty 1

## 2018-08-05 MED ORDER — PENICILLIN V POTASSIUM 500 MG PO TABS: 500.0000 mg | ORAL_TABLET | Freq: Four times a day (QID) | ORAL | 0 refills | Status: AC

## 2018-08-05 MED ORDER — IBUPROFEN 600 MG PO TABS: 600.0000 mg | ORAL_TABLET | Freq: Four times a day (QID) | ORAL | 0 refills | Status: AC | PRN

## 2018-08-05 NOTE — ED Provider Notes (Signed)
[facility] EMERGENCY DEPARTMENT Provider Note   CSN: [id number] Arrival date & time: 08/05/18  0249    History   Chief Complaint Chief Complaint  Patient presents with  . Dental Pain    HPI Monique Harris is a 28 y.o. female.     28 y.o female with no PMH presents to the ED with a chief complaint of dental pain x yesterday. Patient reports pain along her upper left gum line describes it as a sharp pain worse with mastication and talking. She reports taking ibuprofen with no relieve in symptoms. She denies any drainage, fever, or facial swelling.      Past Medical History:  Diagnosis Date  . Abnormal Pap smear     Patient Active Problem List   Diagnosis Date Noted  . Precipitous delivery 03/31/2018  . Insufficient weight gain during pregnancy 01/23/2018  . Abnormal Pap smear of cervix 10/17/2017  . Short interval between pregnancies affecting pregnancy, antepartum 09/14/2017  . Grand multiparity 09/14/2017  . History of cesarean delivery, currently pregnant 10/11/2016  . History of shoulder dystocia in prior pregnancy, currently pregnant in third trimester 10/11/2016  . History of placenta abruption 10/11/2016    Past Surgical History:  Procedure Laterality Date  . CESAREAN SECTION  01/02/2012   Procedure: CESAREAN SECTION;  Surgeon: Tilda Burrow, MD;  Location: [facility];  Service: Gynecology;  Laterality: N/A;  Primary cesarean section of baby boy ay 0125 APGAR 8/9. NRFHT, Abruption=5%     OB History    Gravida  9   Para  6   Term  6   Preterm  0   AB  3   Living  6     SAB  1   TAB  2   Ectopic  0   Multiple  0   Live Births  6            Home Medications    Prior to Admission medications   Medication Sig Start Date End Date Taking? Authorizing Provider  ibuprofen (ADVIL,MOTRIN) 600 MG tablet Take 1 tablet (600 mg total) by mouth every 6 (six) hours as needed for up to 7 days. 08/05/18 08/12/18  Claude Manges, PA-C    penicillin v potassium (VEETID) 500 MG tablet Take 1 tablet (500 mg total) by mouth 4 (four) times daily for 7 days. 08/05/18 08/12/18  Claude Manges, PA-C  Prenatal Vit-Fe Fumarate-FA (MULTIVITAMIN-PRENATAL) 27-0.8 MG TABS tablet Take 1 tablet by mouth daily at 12 noon. 12/19/17   Judeth Horn, NP    Family History Family History  Problem Relation Age of Onset  . Anesthesia problems Neg Hx   . Other Neg Hx     Social History Social History   Tobacco Use  . Smoking status: Current Some Day Smoker    Packs/day: 0.25    Types: Cigarettes    Last attempt to quit: 05/12/2011    Years since quitting: 7.2  . Smokeless tobacco: Never Used  Substance Use Topics  . Alcohol use: No  . Drug use: No     Allergies   Patient has no known allergies.   Review of Systems Review of Systems  Constitutional: Negative for fever.  HENT: Positive for dental problem.      Physical Exam Updated Vital Signs BP 117/86   Pulse 80   Temp 99 F (37.2 C) (Oral)   Resp 20   SpO2 100%   Physical Exam Vitals signs and nursing note reviewed.  Constitutional:      General: She is not in acute distress.    Appearance: She is well-developed.  HENT:     Head: Normocephalic and atraumatic.     Mouth/Throat:     Dentition: Dental tenderness and dental abscesses present.     Pharynx: No oropharyngeal exudate.   Eyes:     Pupils: Pupils are equal, round, and reactive to light.  Neck:     Musculoskeletal: Normal range of motion.  Cardiovascular:     Rate and Rhythm: Regular rhythm.     Heart sounds: Normal heart sounds.  Pulmonary:     Effort: Pulmonary effort is normal. No respiratory distress.     Breath sounds: Normal breath sounds.  Abdominal:     General: Bowel sounds are normal. There is no distension.     Palpations: Abdomen is soft.     Tenderness: There is no abdominal tenderness.  Musculoskeletal:        General: No tenderness or deformity.     Right lower leg: No edema.      Left lower leg: No edema.  Skin:    General: Skin is warm and dry.  Neurological:     Mental Status: She is alert and oriented to person, place, and time.      ED Treatments / Results  Labs (all labs ordered are listed, but only abnormal results are displayed) Labs Reviewed - No data to display  EKG None  Radiology No results found.  Procedures Procedures (including critical care time)  Medications Ordered in ED Medications  ketorolac (TORADOL) 15 MG/ML injection 15 mg (has no administration in time range)     Initial Impression / Assessment and Plan / ED Course  I have reviewed the triage vital signs and the nursing notes.  Pertinent labs & imaging results that were available during my care of the patient were reviewed by me and considered in my medical decision making (see chart for details).       Patient with no PMH presents to the ED with dental pain since yesterday. Reports she has not been seeing by a dentist "in a while". During evaluation there is no facial swelling, draining abscess on the upper left side of the gum line. Patient has stable vital signs. Provided with toradol for her pain will discharge her with PCN and ibuprofen for pain control. Return precautions provided.   Final Clinical Impressions(s) / ED Diagnoses   Final diagnoses:  Dental abscess  Pain, dental    ED Discharge Orders         Ordered    penicillin v potassium (VEETID) 500 MG tablet  4 times daily     08/05/18 0623    ibuprofen (ADVIL,MOTRIN) 600 MG tablet  Every 6 hours PRN     08/05/18 0624           Claude Manges, PA-C 08/05/18 0627    Palumbo, April, MD 08/05/18 [id number]

## 2018-08-05 NOTE — ED Notes (Signed)
Patient verbalizes understanding of discharge instructions. Opportunity for questioning and answers were provided. Armband removed by staff, pt discharged from ED. Ambulated out to lobby  

## 2018-08-05 NOTE — Discharge Instructions (Addendum)
I have provided antibiotics to treat your dental abscess, please take these as directed.  I have also provided resources for dental care, please schedule an appointment for further care of your dental caries and abscess.  I have also provided ibuprofen 600 mg please take this medication as directed for pain.

## 2018-08-05 NOTE — ED Triage Notes (Signed)
Pt reports dental pain on the upper L side for 2 days

## 2018-08-27 IMAGING — US US OB TRANSVAGINAL
1 series · 15 of 28 positions shown · non-contrast
Comparison: 06/01/2017

CLINICAL DATA: Vaginal bleeding beginning yesterday. First
trimester pregnancy with inconclusive fetal viability.

EXAM:
TRANSVAGINAL OB ULTRASOUND
TECHNIQUE: Transvaginal ultrasound was performed for complete evaluation of the
gestation as well as the maternal uterus, adnexal regions, and
pelvic cul-de-sac.

[Series 1: us ob transvaginal · 33 acquisitions, 15 frames shown]
[im 1/33]
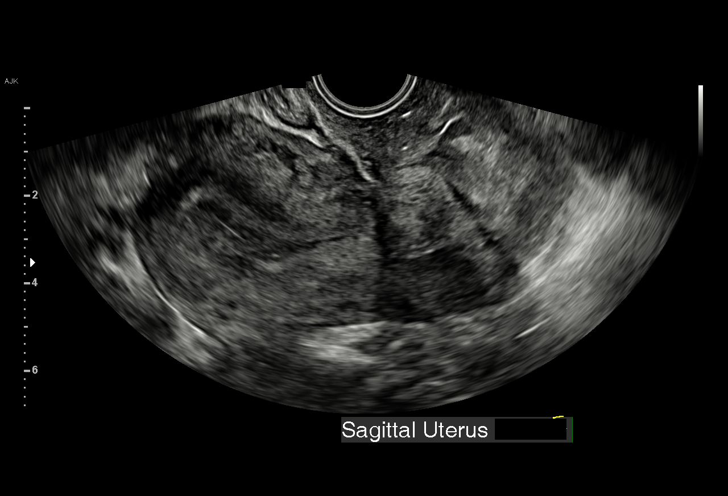
[im 3/33]
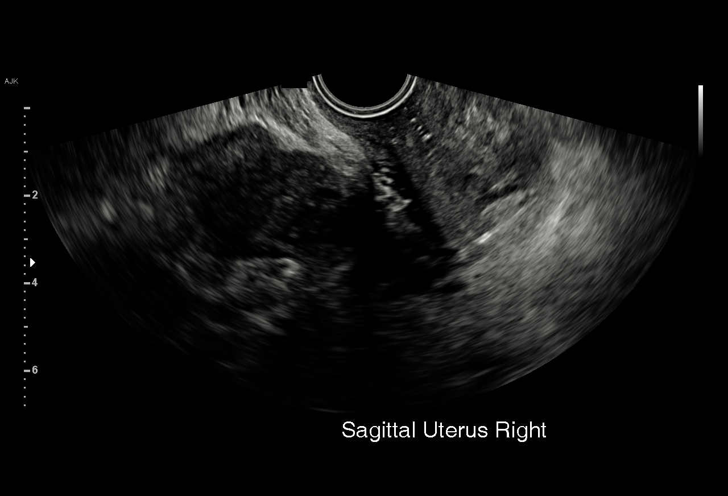
[im 5/33]
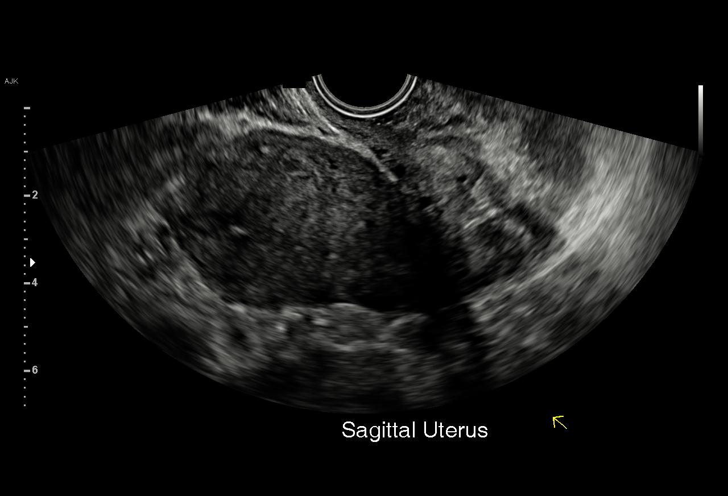
[im 8/33]
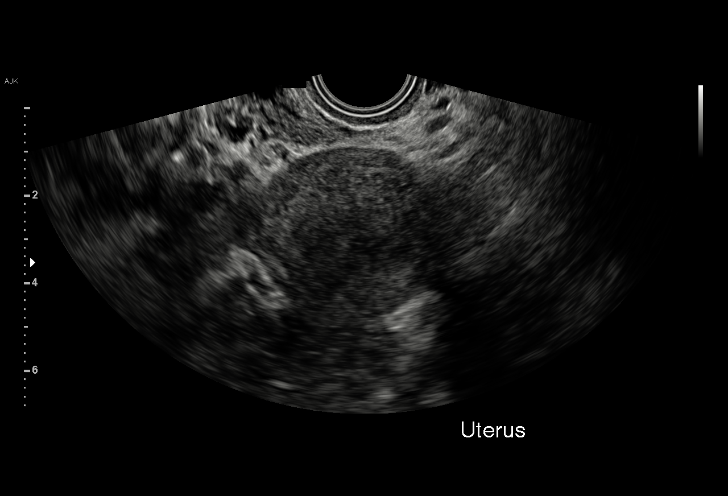
[im 10/33]
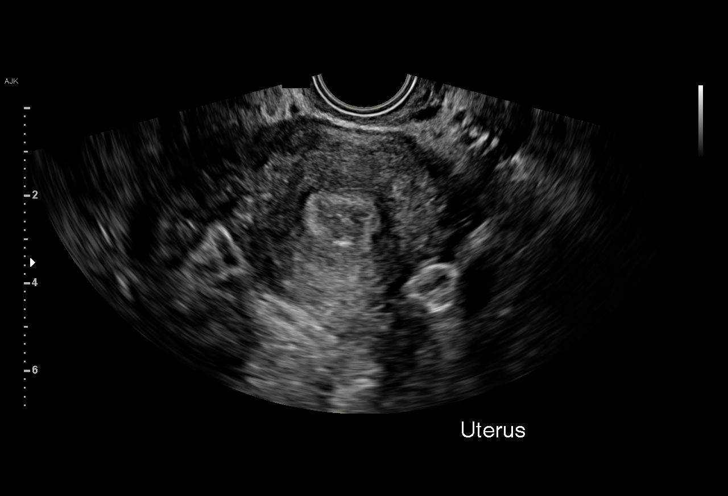
[im 12/33]
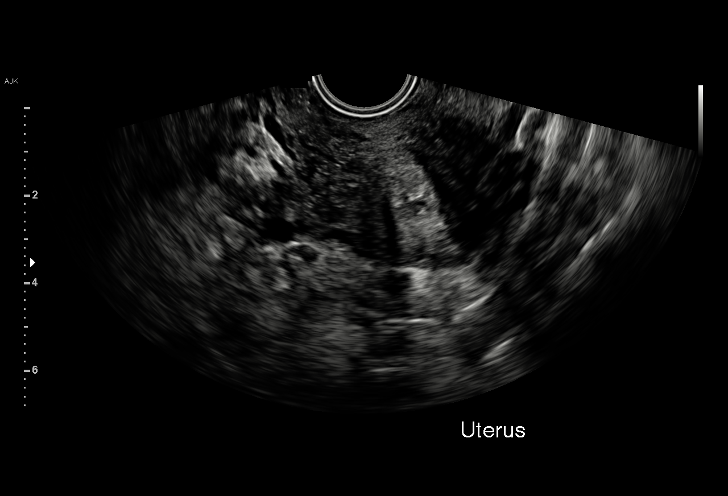
[im 15/33]
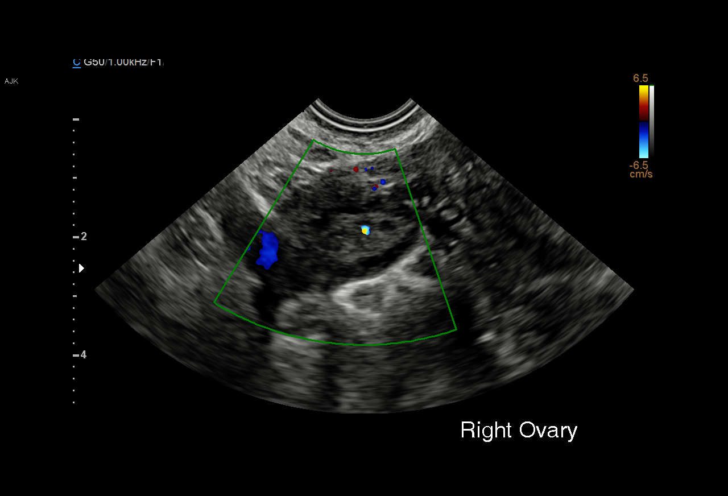
[im 17/33]
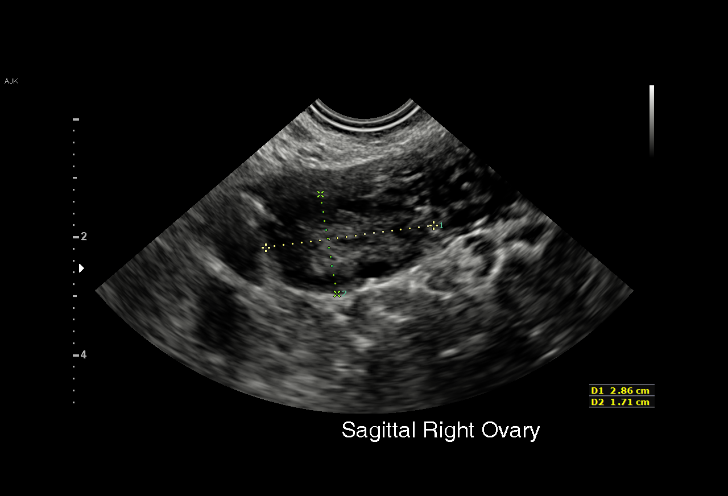
[im 18/33]
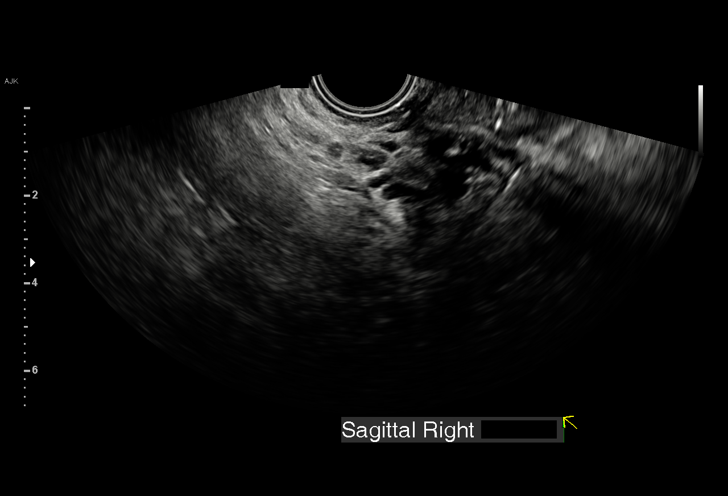
[im 21/33]
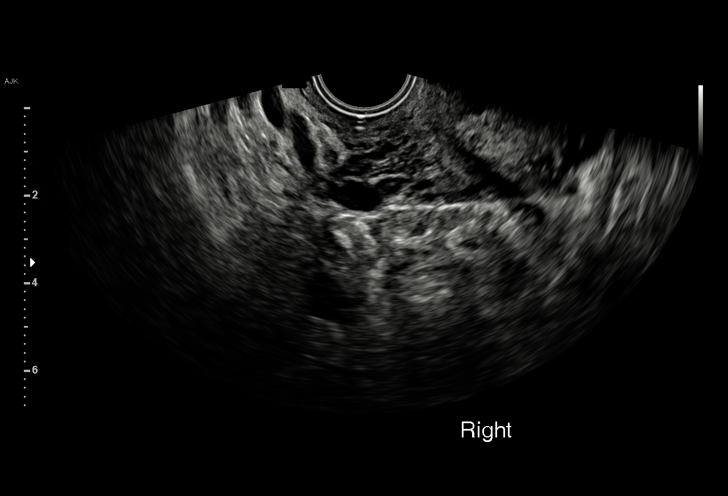
[im 23/33]
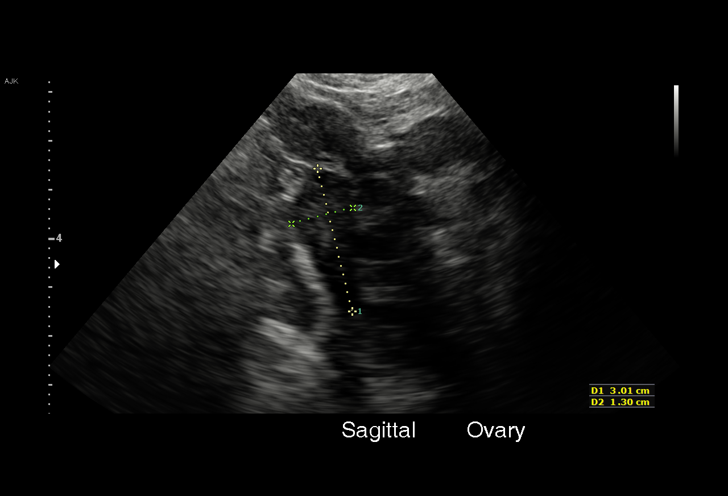
[im 25/33]
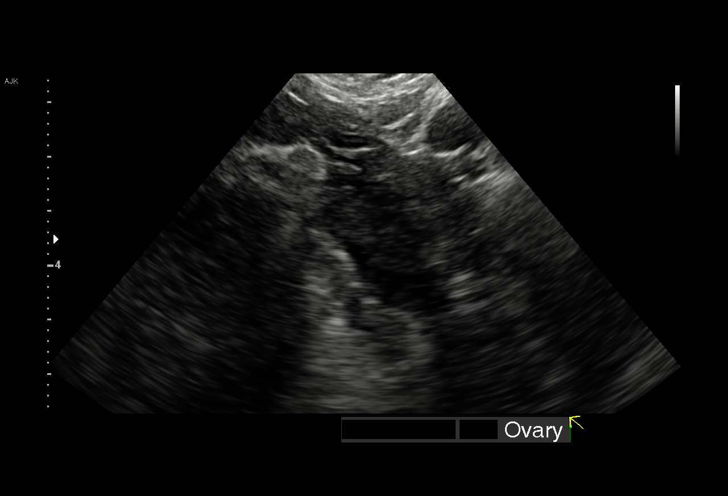
[im 28/33]
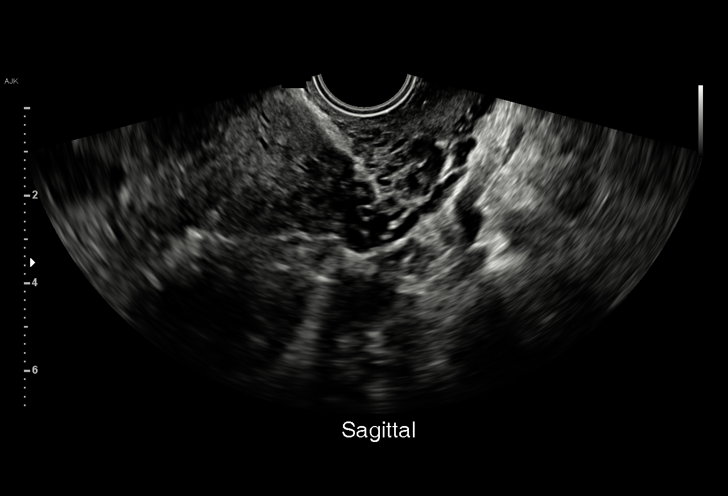
[im 30/33]
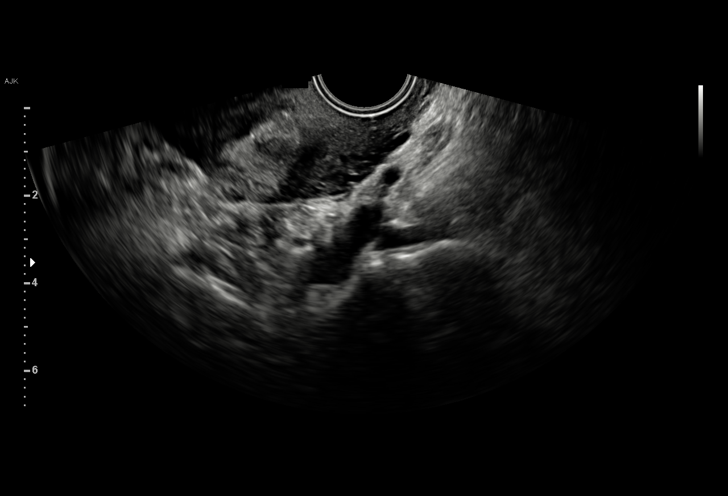
[im 33/33]
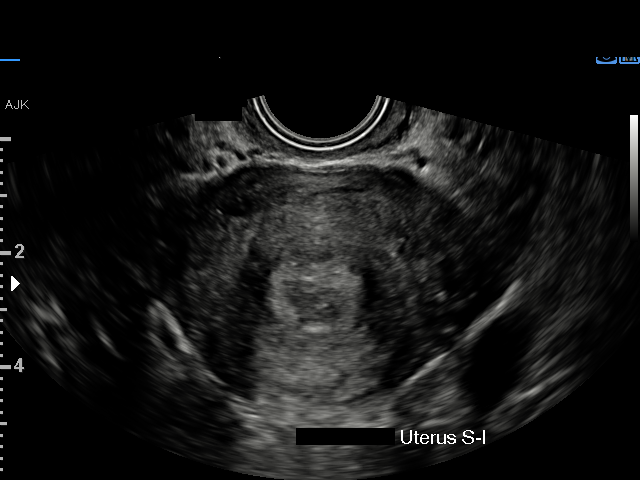

[15 of 28 positions shown; findings below may reference images not displayed]

FINDINGS: Intrauterine gestational sac: None; previously seen intrauterine
gestational sac is no longer visualized

Maternal uterus/adnexae: Tiny amount of fluid noted in endometrial
cavity. No fibroids identified. Both ovaries are normal appearance.
No adnexal mass or free fluid identified.
IMPRESSION: Previously seen intrauterine gestational sac is no longer
visualized, consistent with interval spontaneous abortion.

No significant maternal uterine or adnexal abnormality identified.

## 2018-09-26 ENCOUNTER — Encounter (HOSPITAL_COMMUNITY): Payer: Self-pay | Admitting: Emergency Medicine

## 2018-09-26 ENCOUNTER — Other Ambulatory Visit: Payer: Self-pay

## 2018-09-26 ENCOUNTER — Ambulatory Visit (HOSPITAL_COMMUNITY)
Admission: EM | Admit: 2018-09-26 | Discharge: 2018-09-26 | Disposition: A | Discharge status: Home / Self Care | Payer: Self-pay | Attending: Family Medicine | Admitting: Family Medicine

## 2018-09-26 DIAGNOSIS — R197 Diarrhea, unspecified: Secondary | ICD-10-CM

## 2018-09-26 DIAGNOSIS — Z711 Person with feared health complaint in whom no diagnosis is made: Secondary | ICD-10-CM | POA: Insufficient documentation

## 2018-09-26 LAB — POCT URINALYSIS DIP (DEVICE)
Bilirubin Urine: NEGATIVE
Glucose, UA: NEGATIVE mg/dL
Hgb urine dipstick: NEGATIVE
Ketones, ur: NEGATIVE mg/dL
Leukocytes,Ua: NEGATIVE
Nitrite: NEGATIVE
Protein, ur: NEGATIVE mg/dL
Specific Gravity, Urine: 1.025 (ref 1.005–1.030)
Urobilinogen, UA: 0.2 mg/dL (ref 0.0–1.0)
pH: 7 (ref 5.0–8.0)

## 2018-09-26 LAB — POCT PREGNANCY, URINE: Preg Test, Ur: NEGATIVE

## 2018-09-26 LAB — GASTROINTESTINAL PANEL BY PCR, STOOL (REPLACES STOOL CULTURE)

## 2018-09-26 MED ORDER — LIDOCAINE HCL (PF) 1 % IJ SOLN
INTRAMUSCULAR | Status: AC
  Filled 2018-09-26: qty 2

## 2018-09-26 MED ORDER — CEFTRIAXONE SODIUM 250 MG IJ SOLR
INTRAMUSCULAR | Status: AC
  Filled 2018-09-26: qty 250

## 2018-09-26 MED ORDER — CEFTRIAXONE SODIUM 250 MG IJ SOLR
250.0000 mg | Freq: Once | INTRAMUSCULAR | Status: AC
  Administered 2018-09-26: 12:00:00 250 mg via INTRAMUSCULAR

## 2018-09-26 MED ORDER — AZITHROMYCIN 250 MG PO TABS
1000.0000 mg | ORAL_TABLET | Freq: Once | ORAL | Status: AC
  Administered 2018-09-26: 12:00:00 1000 mg via ORAL

## 2018-09-26 MED ORDER — AZITHROMYCIN 250 MG PO TABS
ORAL_TABLET | ORAL | Status: AC
  Filled 2018-09-26: qty 4

## 2018-09-26 NOTE — ED Triage Notes (Signed)
Pt c/o diarrhea x3 weeks, stats also her stomach has been hurting for the last few days. Denies nausea or vomiting. States she had unprotected sex a few days ago and her stomach started hurting after.

## 2018-09-26 NOTE — Discharge Instructions (Addendum)
We are sending the stool sample for testing.  I recommend starting a probiotic to regulate the gastrointestinal tract.  Align is a good brand.  The upper abdominal discomfort you are feeling could be coming from constipation.  Sometimes we have constipation or large stool that is blocking the intestines we can still have some diarrhea go around the stool. I will call you when I get the results of your stool sample and decide treatment at that point. Your urine was negative for infection or pregnancy We will send the swab for STD testing and drawing blood for HIV and syphilis testing We treated you today prophylactically for gonorrhea and chlamydia We will call with any positive results

## 2018-09-26 NOTE — ED Provider Notes (Signed)
[facility]    CSN: [id number] Arrival date & time: 09/26/18  1105     History   Chief Complaint Chief Complaint  Patient presents with  . Diarrhea  . Abdominal Pain    HPI Monique Harris is a 28 y.o. female.   Patient is a 28 year old female who presents today with multiple complaints.  First complaint being diarrhea that has been constant over the past 3 weeks.  Reporting about 4-5 episodes of semisolid/watery diarrhea daily.  Food or drinking does not make this worse. No nausea, vomiting.  Reports that the diarrhea comes at random times.  Approximate 3 days ago she started having some generalized upper abdominal discomfort after having unprotected sex.  Denies any associated vaginal discharge, itching, irritation, dysuria, hematuria, urinary frequency.  Patient with recent pregnancy.Patient's last menstrual period was 09/08/2018. Recent abx use, PCN for treatment of dental infection. She has had some change in diet in the last few weeks. No recent travels, sick contacts or fever.   ROS per HPI       Past Medical History:  Diagnosis Date  . Abnormal Pap smear     Patient Active Problem List   Diagnosis Date Noted  . Precipitous delivery 03/31/2018  . Insufficient weight gain during pregnancy 01/23/2018  . Abnormal Pap smear of cervix 10/17/2017  . Short interval between pregnancies affecting pregnancy, antepartum 09/14/2017  . Grand multiparity 09/14/2017  . History of cesarean delivery, currently pregnant 10/11/2016  . History of shoulder dystocia in prior pregnancy, currently pregnant in third trimester 10/11/2016  . History of placenta abruption 10/11/2016    Past Surgical History:  Procedure Laterality Date  . CESAREAN SECTION  01/02/2012   Procedure: CESAREAN SECTION;  Surgeon: Tilda Burrow, MD;  Location: [facility];  Service: Gynecology;  Laterality: N/A;  Primary cesarean section of baby boy ay 0125 APGAR 8/9. NRFHT, Abruption=5%    OB History     Gravida  9   Para  6   Term  6   Preterm  0   AB  3   Living  6     SAB  1   TAB  2   Ectopic  0   Multiple  0   Live Births  6            Home Medications    Prior to Admission medications   Medication Sig Start Date End Date Taking? Authorizing Provider  Prenatal Vit-Fe Fumarate-FA (MULTIVITAMIN-PRENATAL) 27-0.8 MG TABS tablet Take 1 tablet by mouth daily at 12 noon. 12/19/17   Judeth Horn, NP    Family History Family History  Problem Relation Age of Onset  . Anesthesia problems Neg Hx   . Other Neg Hx     Social History Social History   Tobacco Use  . Smoking status: Current Some Day Smoker    Packs/day: 0.25    Types: Cigarettes    Last attempt to quit: 05/12/2011    Years since quitting: 7.3  . Smokeless tobacco: Never Used  Substance Use Topics  . Alcohol use: No  . Drug use: No     Allergies   Patient has no known allergies.   Review of Systems Review of Systems   Physical Exam Triage Vital Signs ED Triage Vitals  Enc Vitals Group     BP 09/26/18 1131 117/88     Pulse Rate 09/26/18 1131 87     Resp 09/26/18 1131 18     Temp 09/26/18  1131 98.4 F (36.9 C)     Temp Source 09/26/18 1131 Oral     SpO2 09/26/18 1131 100 %     Weight --      Height --      Head Circumference --      Peak Flow --      Pain Score 09/26/18 1135 8     Pain Loc --      Pain Edu? --      Excl. in GC? --    No data found.  Updated Vital Signs BP 117/88 (BP Location: Left Arm)   Pulse 87   Temp 98.4 F (36.9 C) (Oral)   Resp 18   LMP 09/08/2018   SpO2 100%   Breastfeeding No   Visual Acuity Right Eye Distance:   Left Eye Distance:   Bilateral Distance:    Right Eye Near:   Left Eye Near:    Bilateral Near:     Physical Exam Vitals signs and nursing note reviewed.  Constitutional:      General: She is not in acute distress.    Appearance: She is well-developed. She is not ill-appearing, toxic-appearing or diaphoretic.  HENT:      Head: Normocephalic and atraumatic.  Pulmonary:     Effort: Pulmonary effort is normal.  Abdominal:     General: Bowel sounds are normal.     Palpations: Abdomen is soft. There is no hepatomegaly or splenomegaly.     Tenderness: There is generalized abdominal tenderness. There is no right CVA tenderness, left CVA tenderness, guarding or rebound.     Comments: Generalized upper abdominal discomfort  Genitourinary:    Comments: Deferred  Skin:    General: Skin is warm and dry.  Neurological:     Mental Status: She is alert.  Psychiatric:        Mood and Affect: Mood normal.      UC Treatments / Results  Labs (all labs ordered are listed, but only abnormal results are displayed) Labs Reviewed  GASTROINTESTINAL PANEL BY PCR, STOOL (REPLACES STOOL CULTURE)  HIV ANTIBODY (ROUTINE TESTING W REFLEX)  RPR  POCT URINALYSIS DIP (DEVICE)  POCT PREGNANCY, URINE  CERVICOVAGINAL ANCILLARY ONLY    EKG None  Radiology No results found.  Procedures Procedures (including critical care time)  Medications Ordered in UC Medications  cefTRIAXone (ROCEPHIN) injection 250 mg (250 mg Intramuscular Given 09/26/18 1224)  azithromycin (ZITHROMAX) tablet 1,000 mg (1,000 mg Oral Given 09/26/18 1224)    Initial Impression / Assessment and Plan / UC Course  I have reviewed the triage vital signs and the nursing notes.  Pertinent labs & imaging results that were available during my care of the patient were reviewed by me and considered in my medical decision making (see chart for details).    Diarrhea  Patient with generalized up abdominal discomfort and diarrhea.   The diarrhea has been for 3 weeks.  Stool sample obtained for testing. Not likely C. Difficile, travelers diarrhea Could be diet related.  Instructed patient that she could have some constipation causing her upper abdominal discomfort and blockage with some leakage of stool around the blockage. Recommended taking probiotic to  relieve the gut.  We will call her with any positive stool results.  STD concern  Patient also concerned for STDs after unprotected sex.  We will go ahead and test for STDs today and treat prophylactically for gonorrhea and chlamydia. Urine was negative for infection or pregnancy Final Clinical Impressions(s) / UC  Diagnoses   Final diagnoses:  Diarrhea, unspecified type  Concern about STD in female without diagnosis     Discharge Instructions     We are sending the stool sample for testing.  I recommend starting a probiotic to regulate the gastrointestinal tract.  Align is a good brand.  The upper abdominal discomfort you are feeling could be coming from constipation.  Sometimes we have constipation or large stool that is blocking the intestines we can still have some diarrhea go around the stool. I will call you when I get the results of your stool sample and decide treatment at that point. Your urine was negative for infection or pregnancy We will send the swab for STD testing and drawing blood for HIV and syphilis testing We treated you today prophylactically for gonorrhea and chlamydia We will call with any positive results    ED Prescriptions    None     Controlled Substance Prescriptions [place] Controlled Substance Registry consulted? Not Applicable   Janace Aris, NP 09/26/18 1232

## 2018-09-27 LAB — CERVICOVAGINAL ANCILLARY ONLY
Bacterial vaginitis: POSITIVE — AB
Candida vaginitis: NEGATIVE
Chlamydia: NEGATIVE
Neisseria Gonorrhea: NEGATIVE
Trichomonas: NEGATIVE

## 2018-09-27 LAB — HIV ANTIBODY (ROUTINE TESTING W REFLEX): HIV Screen 4th Generation wRfx: NONREACTIVE

## 2018-09-27 LAB — RPR: RPR Ser Ql: NONREACTIVE

## 2019-01-16 ENCOUNTER — Other Ambulatory Visit: Payer: Self-pay

## 2019-01-16 ENCOUNTER — Encounter (HOSPITAL_COMMUNITY): Payer: Self-pay | Admitting: Emergency Medicine

## 2019-01-16 ENCOUNTER — Ambulatory Visit (HOSPITAL_COMMUNITY)
Admission: EM | Admit: 2019-01-16 | Discharge: 2019-01-16 | Disposition: A | Discharge status: Home / Self Care | Payer: Self-pay | Attending: Urgent Care | Admitting: Urgent Care

## 2019-01-16 DIAGNOSIS — N898 Other specified noninflammatory disorders of vagina: Secondary | ICD-10-CM | POA: Insufficient documentation

## 2019-01-16 NOTE — ED Triage Notes (Signed)
Pt here for STD screening; pt sts vaginal discharge with odor

## 2019-01-16 NOTE — ED Provider Notes (Signed)
  MRN: [id number] DOB: 01-19-1991  Subjective:   Monique Harris is a 28 y.o. female presenting for several day history of moderate vaginal discharge with a bad odor.  Patient reports that she is sexually active and does not use condoms.  LMP was 1 week ago.  Has a history of BV several years ago around her pregnancy.  No current facility-administered medications for this encounter.   Current Outpatient Medications:  .  Prenatal Vit-Fe Fumarate-FA (MULTIVITAMIN-PRENATAL) 27-0.8 MG TABS tablet, Take 1 tablet by mouth daily at 12 noon., Disp: 30 tablet, Rfl: 11   No Known Allergies  Past Medical History:  Diagnosis Date  . Abnormal Pap smear      Past Surgical History:  Procedure Laterality Date  . CESAREAN SECTION  01/02/2012   Procedure: CESAREAN SECTION;  Surgeon: Jonnie Kind, MD;  Location: [facility];  Service: Gynecology;  Laterality: N/A;  Primary cesarean section of baby boy ay 92 APGAR 8/9. NRFHT, Abruption=5%    Review of Systems  Constitutional: Negative for fever and malaise/fatigue.  HENT: Negative for congestion, ear pain, sinus pain and sore throat.   Eyes: Negative for blurred vision, double vision, discharge and redness.  Respiratory: Negative for cough, hemoptysis, shortness of breath and wheezing.   Cardiovascular: Negative for chest pain.  Gastrointestinal: Negative for abdominal pain, diarrhea, nausea and vomiting.  Genitourinary: Negative for dysuria, flank pain, frequency, hematuria and urgency.       Denies pelvic pain, genital rash.  Musculoskeletal: Negative for myalgias.  Skin: Negative for rash.  Neurological: Negative for dizziness, weakness and headaches.  Psychiatric/Behavioral: Negative for depression and substance abuse.    Objective:   Vitals: BP 105/78 (BP Location: Left Arm)   Pulse 73   Temp 98.4 F (36.9 C) (Temporal)   Resp 18   SpO2 100%   Physical Exam Constitutional:      General: She is not in acute distress.    Appearance:  Normal appearance. She is well-developed. She is not ill-appearing.  HENT:     Head: Normocephalic and atraumatic.     Nose: Nose normal.     Mouth/Throat:     Mouth: Mucous membranes are moist.     Pharynx: Oropharynx is clear.  Eyes:     General: No scleral icterus.    Extraocular Movements: Extraocular movements intact.     Pupils: Pupils are equal, round, and reactive to light.  Cardiovascular:     Rate and Rhythm: Normal rate.  Pulmonary:     Effort: Pulmonary effort is normal.  Skin:    General: Skin is warm and dry.  Neurological:     General: No focal deficit present.     Mental Status: She is alert and oriented to person, place, and time.  Psychiatric:        Mood and Affect: Mood normal.        Behavior: Behavior normal.        Thought Content: Thought content normal.        Judgment: Judgment normal.      Assessment and Plan :   1. Vaginal discharge     Patient declined empiric treatment as per CDC guidelines.  She prefers to wait on the lab results.  Counseled on safe sex practices.  Will notify patient of her results through my chart.   Jaynee Eagles, [place] 01/16/19 1533

## 2019-01-21 LAB — CERVICOVAGINAL ANCILLARY ONLY
Candida vaginitis: NEGATIVE
Chlamydia: NEGATIVE
Neisseria Gonorrhea: NEGATIVE
Trichomonas: NEGATIVE

## 2019-01-22 ENCOUNTER — Telehealth (HOSPITAL_COMMUNITY): Payer: Self-pay | Admitting: Emergency Medicine

## 2019-01-22 ENCOUNTER — Encounter (HOSPITAL_COMMUNITY): Payer: Self-pay

## 2019-01-22 MED ORDER — METRONIDAZOLE 500 MG PO TABS: 500.0000 mg | ORAL_TABLET | Freq: Two times a day (BID) | ORAL | 0 refills | Status: AC

## 2019-01-22 NOTE — Telephone Encounter (Signed)
Patient contacted and made aware of  results, all questions answered  Pt states she recently was told her partner had syphilis, pt offered to return and have testing for syphilis done. Educated pt on s/s and treatment for syphilis. All questions answered.

## 2019-01-22 NOTE — Telephone Encounter (Signed)
Bacterial vaginosis is positive. This was not treated at the urgent care visit.  Flagyl 500 mg BID x 7 days #14 no refills sent to patients pharmacy of choice.    Attempted to reach patient. No answer at this time. Voicemail left.    

## 2019-04-08 ENCOUNTER — Other Ambulatory Visit: Payer: Self-pay

## 2019-04-08 ENCOUNTER — Encounter (HOSPITAL_COMMUNITY): Payer: Self-pay | Admitting: *Deleted

## 2019-04-08 ENCOUNTER — Inpatient Hospital Stay (HOSPITAL_COMMUNITY): Payer: Self-pay

## 2019-04-08 ENCOUNTER — Inpatient Hospital Stay (HOSPITAL_COMMUNITY)
Admission: AD | Admit: 2019-04-08 | Discharge: 2019-04-08 | Disposition: A | Discharge status: Home / Self Care | Payer: Self-pay | Attending: Obstetrics and Gynecology | Admitting: Obstetrics and Gynecology

## 2019-04-08 DIAGNOSIS — B373 Candidiasis of vulva and vagina: Secondary | ICD-10-CM | POA: Insufficient documentation

## 2019-04-08 DIAGNOSIS — O99331 Smoking (tobacco) complicating pregnancy, first trimester: Secondary | ICD-10-CM | POA: Insufficient documentation

## 2019-04-08 DIAGNOSIS — R109 Unspecified abdominal pain: Secondary | ICD-10-CM

## 2019-04-08 DIAGNOSIS — R197 Diarrhea, unspecified: Secondary | ICD-10-CM | POA: Insufficient documentation

## 2019-04-08 DIAGNOSIS — F17210 Nicotine dependence, cigarettes, uncomplicated: Secondary | ICD-10-CM | POA: Insufficient documentation

## 2019-04-08 DIAGNOSIS — R1030 Lower abdominal pain, unspecified: Secondary | ICD-10-CM | POA: Insufficient documentation

## 2019-04-08 DIAGNOSIS — B3731 Acute candidiasis of vulva and vagina: Secondary | ICD-10-CM

## 2019-04-08 DIAGNOSIS — Z3491 Encounter for supervision of normal pregnancy, unspecified, first trimester: Secondary | ICD-10-CM

## 2019-04-08 DIAGNOSIS — O26891 Other specified pregnancy related conditions, first trimester: Secondary | ICD-10-CM | POA: Insufficient documentation

## 2019-04-08 DIAGNOSIS — Z3A10 10 weeks gestation of pregnancy: Secondary | ICD-10-CM | POA: Insufficient documentation

## 2019-04-08 LAB — WET PREP, GENITAL
Clue Cells Wet Prep HPF POC: NONE SEEN
Sperm: NONE SEEN
Trich, Wet Prep: NONE SEEN

## 2019-04-08 LAB — URINALYSIS, ROUTINE W REFLEX MICROSCOPIC
Bacteria, UA: NONE SEEN
Bilirubin Urine: NEGATIVE
Glucose, UA: NEGATIVE mg/dL
Hgb urine dipstick: NEGATIVE
Ketones, ur: NEGATIVE mg/dL
Leukocytes,Ua: NEGATIVE
Nitrite: NEGATIVE
Protein, ur: NEGATIVE mg/dL
Specific Gravity, Urine: 1.015 (ref 1.005–1.030)
pH: 7 (ref 5.0–8.0)

## 2019-04-08 LAB — CBC
HCT: 33.4 % — ABNORMAL LOW (ref 36.0–46.0)
Hemoglobin: 11.1 g/dL — ABNORMAL LOW (ref 12.0–15.0)
MCH: 27.8 pg (ref 26.0–34.0)
MCHC: 33.2 g/dL (ref 30.0–36.0)
MCV: 83.5 fL (ref 80.0–100.0)
Platelets: 291 10*3/uL (ref 150–400)
RBC: 4 MIL/uL (ref 3.87–5.11)
RDW: 12.9 % (ref 11.5–15.5)
WBC: 8.6 10*3/uL (ref 4.0–10.5)
nRBC: 0 % (ref 0.0–0.2)

## 2019-04-08 LAB — POCT PREGNANCY, URINE: Preg Test, Ur: POSITIVE — AB

## 2019-04-08 LAB — HCG, QUANTITATIVE, PREGNANCY: hCG, Beta Chain, Quant, S: 182293 m[IU]/mL — ABNORMAL HIGH (ref <5)

## 2019-04-08 MED ORDER — TERCONAZOLE 0.8 % VA CREA: 1.0000 | TOPICAL_CREAM | Freq: Every day | VAGINAL | 0 refills | Status: DC

## 2019-04-08 NOTE — MAU Provider Note (Signed)
Chief Complaint: Abdominal Pain   First Provider Initiated Contact with Patient 04/08/19 1408     SUBJECTIVE HPI: Monique Harris is a 28 y.o. [id number] at [redacted]w[redacted]d who presents to Maternity Admissions reporting abdominal pain. Pain has been intermittent for the last several week. Pain was mostly in RLQ last night after eating late at night but has alternated sides. Had an episode of diarrhea last night. Denies n/v, dysuria, vaginal discharge, or vaginal bleeding.   Location: abdomen Quality: cramping Severity: 3/10 on pain scale Duration: 3 weeks Timing: intermittent Modifying factors: none Associated signs and symptoms: none  Past Medical History:  Diagnosis Date  . Abnormal Pap smear    OB History  Gravida Para Term Preterm AB Living  10 6 6  0 3 6  SAB TAB Ectopic Multiple Live Births  1 2 0 0 6    # Outcome Date GA Lbr Len/2nd Weight Sex Delivery Anes PTL Lv  10 Current           9 Term 03/30/18 [redacted]w[redacted]d 03:20 / 00:05 2778 g F VBAC None  LIV  8 SAB 06/12/17 [redacted]w[redacted]d         7 Term 11/29/16 [redacted]w[redacted]d 14:42 / 01:16 3189 g M VBAC EPI  LIV  6 Term 01/02/12 [redacted]w[redacted]d  2611 g M CS-LTranv EPI  LIV  5 Term 08/28/09 [redacted]w[redacted]d  3175 g F Vag-Spont EPI  LIV  4 Term 07/15/07 [redacted]w[redacted]d  2863 g M Vag-Spont EPI  LIV  3 Term 08/27/06 [redacted]w[redacted]d  2722 g F Vag-Spont EPI  LIV  2 TAB           1 TAB            Past Surgical History:  Procedure Laterality Date  . CESAREAN SECTION  01/02/2012   Procedure: CESAREAN SECTION;  Surgeon: 01/04/2012, MD;  Location: [facility];  Service: Gynecology;  Laterality: N/A;  Primary cesarean section of baby boy ay 0125 APGAR 8/9. NRFHT, Abruption=5%   Social History   Socioeconomic History  . Marital status: Single    Spouse name: Not on file  . Number of children: Not on file  . Years of education: Not on file  . Highest education level: Not on file  Occupational History  . Not on file  Social Needs  . Financial resource strain: Not on file  . Food insecurity    Worry: Not  on file    Inability: Not on file  . Transportation needs    Medical: Not on file    Non-medical: Not on file  Tobacco Use  . Smoking status: Current Some Day Smoker    Packs/day: 0.25    Types: Cigarettes    Last attempt to quit: 05/12/2011    Years since quitting: 7.9  . Smokeless tobacco: Never Used  Substance and Sexual Activity  . Alcohol use: No  . Drug use: No  . Sexual activity: Not Currently    Birth control/protection: None  Lifestyle  . Physical activity    Days per week: Not on file    Minutes per session: Not on file  . Stress: Not on file  Relationships  . Social 05/14/2011 on phone: Not on file    Gets together: Not on file    Attends religious service: Not on file    Active member of club or organization: Not on file    Attends meetings of clubs or organizations: Not on file    Relationship  status: Not on file  . Intimate partner violence    Fear of current or ex partner: Not on file    Emotionally abused: Not on file    Physically abused: Not on file    Forced sexual activity: Not on file  Other Topics Concern  . Not on file  Social History Narrative  . Not on file   Family History  Problem Relation Age of Onset  . Anesthesia problems Neg Hx   . Other Neg Hx    No current facility-administered medications on file prior to encounter.    Current Outpatient Medications on File Prior to Encounter  Medication Sig Dispense Refill  . Prenatal Vit-Fe Fumarate-FA (MULTIVITAMIN-PRENATAL) 27-0.8 MG TABS tablet Take 1 tablet by mouth daily at 12 noon. 30 tablet 11   No Known Allergies  I have reviewed patient's Past Medical Hx, Surgical Hx, Family Hx, Social Hx, medications and allergies.   Review of Systems  Constitutional: Negative.   Gastrointestinal: Positive for abdominal pain and diarrhea. Negative for nausea and vomiting.  Genitourinary: Negative.     OBJECTIVE Patient Vitals for the past 24 hrs:  BP Temp Pulse Resp SpO2 Height  Weight  04/08/19 1356 105/63 97.7 F (36.5 C) 66 16 99 % 5\' 2"  (1.575 m) 70.8 kg   Constitutional: Well-developed, well-nourished female in no acute distress.  Cardiovascular: normal rate & rhythm, no murmur Respiratory: normal rate and effort. Lung sounds clear throughout GI: Abd soft, non-tender, Pos BS x 4. No guarding or rebound tenderness MS: Extremities nontender, no edema, normal ROM Neurologic: Alert and oriented x 4.    LAB RESULTS Results for orders placed or performed during the hospital encounter of 04/08/19 (from the past 24 hour(s))  Pregnancy, urine POC     Status: Abnormal   Collection Time: 04/08/19 12:54 PM  Result Value Ref Range   Preg Test, Ur POSITIVE (A) NEGATIVE  Urinalysis, Routine w reflex microscopic     Status: None   Collection Time: 04/08/19  2:20 PM  Result Value Ref Range   Color, Urine YELLOW YELLOW   APPearance CLEAR CLEAR   Specific Gravity, Urine 1.015 1.005 - 1.030   pH 7.0 5.0 - 8.0   Glucose, UA NEGATIVE NEGATIVE mg/dL   Hgb urine dipstick NEGATIVE NEGATIVE   Bilirubin Urine NEGATIVE NEGATIVE   Ketones, ur NEGATIVE NEGATIVE mg/dL   Protein, ur NEGATIVE NEGATIVE mg/dL   Nitrite NEGATIVE NEGATIVE   Leukocytes,Ua NEGATIVE NEGATIVE   RBC / HPF 0-5 0 - 5 RBC/hpf   WBC, UA 0-5 0 - 5 WBC/hpf   Bacteria, UA NONE SEEN NONE SEEN   Squamous Epithelial / LPF 0-5 0 - 5   Mucus PRESENT   CBC     Status: Abnormal   Collection Time: 04/08/19  2:22 PM  Result Value Ref Range   WBC 8.6 4.0 - 10.5 K/uL   RBC 4.00 3.87 - 5.11 MIL/uL   Hemoglobin 11.1 (L) 12.0 - 15.0 g/dL   HCT 44.033.4 (L) 10.236.0 - 72.546.0 %   MCV 83.5 80.0 - 100.0 fL   MCH 27.8 26.0 - 34.0 pg   MCHC 33.2 30.0 - 36.0 g/dL   RDW 36.612.9 44.011.5 - 34.715.5 %   Platelets 291 150 - 400 K/uL   nRBC 0.0 0.0 - 0.2 %  hCG, quantitative, pregnancy     Status: Abnormal   Collection Time: 04/08/19  2:22 PM  Result Value Ref Range   hCG, Beta Chain, Quant, S 182,293 (H) <  5 mIU/mL  Wet prep, genital      Status: Abnormal   Collection Time: 04/08/19  2:42 PM  Result Value Ref Range   Yeast Wet Prep HPF POC PRESENT (A) NONE SEEN   Trich, Wet Prep NONE SEEN NONE SEEN   Clue Cells Wet Prep HPF POC NONE SEEN NONE SEEN   WBC, Wet Prep HPF POC MODERATE (A) NONE SEEN   Sperm NONE SEEN     IMAGING US Ob Comp Less 14 Wks  Result Date: 04/08/2019 CLINICAL DATA:  Lower abdominal pain EXAM: OBSTETRIC <14 WK ULTRASOUND TECHNIQUE: Transabdominal ultrasound was performed for evaluation of the gestation as well as the maternal uterus and adnexal regions. COMPARISON:  None. FINDINGS: Intrauterine gestational sac: Single Yolk sac:  Visualized. Embryo:  Visualized. Cardiac Activity: Heart Rate: 159 bpm CRL: 15.72 mm   7 w 6 d                  [place] EDC: 11/19/2019 Subchorionic hemorrhage:  None visualized. Maternal uterus/adnexae: The right ovary was not visualized. The left ovary was unremarkable. There is no significant free fluid. IMPRESSION: Single live IUP at 7 weeks and 6 days. Electronically Signed   By: Constance Holster M.D.   On: 04/08/2019 15:27    MAU COURSE Orders Placed This Encounter  Procedures  . Wet prep, genital  . US OB Comp Less 14 Wks  . CBC  . hCG, quantitative, pregnancy  . RPR  . Urinalysis, Routine w reflex microscopic  . Pregnancy, urine POC  . Discharge patient   Meds ordered this encounter  Medications  . terconazole (TERAZOL 3) 0.8 % vaginal cream    Sig: Place 1 applicator vaginally at bedtime.    Dispense:  20 g    Refill:  0    Order Specific Question:   Supervising Provider    Answer:   CONSTANT, PEGGY [[id number]]    MDM +UPT UA, wet prep, GC/chlamydia, CBC, ABO/Rh, quant hCG, and [place] today to rule out ectopic pregnancy  Ultrasound confirms live IUP measuring [redacted]w[redacted]d, EDD updated  Wet prep + yeast  ASSESSMENT 1. Normal IUP (intrauterine pregnancy) on prenatal ultrasound, first trimester   2. Abdominal pain during pregnancy in first trimester   3. Vaginal yeast  infection     PLAN Discharge home in stable condition. SAB precautions Rx terazol GC/CT pending Start prenatal care  [place] [facility]. Schedule an appointment as soon as possible for a visit.   Specialty: Obstetrics and Gynecology Contact information: [address], [place] [id number] [place] [id number] [phone]         Allergies as of 04/08/2019   No Known Allergies     Medication List    TAKE these medications   multivitamin-prenatal 27-0.8 MG Tabs tablet Take 1 tablet by mouth daily at 12 noon.   terconazole 0.8 % vaginal cream Commonly known as: Terazol 3 Place 1 applicator vaginally at bedtime.        Jorje Guild, NP 04/08/2019  5:46 PM

## 2019-04-08 NOTE — MAU Note (Signed)
.   Monique Harris is a 28 y.o. at [redacted]w[redacted]d here in MAU reporting: lower abdominal since she had a positive pregnancy test in September. Denies any vaginal bleeding LMP: 01/26/19 Onset of complaint: ongoing couple of weeks Pain score: 3 Vitals:   04/08/19 1356  BP: 105/63  Pulse: 66  Resp: 16  Temp: 97.7 F (36.5 C)  SpO2: 99%     FHT: Lab orders placed from triage: UPT/UA

## 2019-04-08 NOTE — Discharge Instructions (Signed)
[place] for [place] at [facility]       Phone: [phone]  [place] for [place] at [place] Phone: [place] for [employer] at [place]  Phone: [place] for [place] at [facility]  Phone: [place] for [place] at [facility]  Phone: [facility]       Phone: [phone]  [facility]    Phone: [phone]   [facility])    Phone: [facility]    Phone: [phone]  [facility]    Phone: [place] [facility]  Phone: [place]    Phone: [phone]  [facility]   Phone: [phone]  [facility]    Phone: [phone]     Vaginal Yeast infection, Adult  Vaginal yeast infection is a condition that causes vaginal discharge as well as soreness, swelling, and redness (inflammation) of the vagina. This is a common condition. Some women get this infection frequently. What are the causes? This condition is caused by a change in the normal balance of the yeast (candida) and bacteria that live in the vagina. This change causes an overgrowth of yeast, which causes the inflammation. What increases the risk? The condition is more likely to develop in women who:  Take antibiotic medicines.  Have diabetes.  Take birth control pills.  Are pregnant.  Douche often.  Have a weak body defense system (immune system).  Have been taking steroid medicines for a long time.  Frequently wear tight clothing. What are the signs or symptoms? Symptoms of this condition include:  White, thick, creamy vaginal discharge.  Swelling, itching, redness, and irritation of the vagina. The lips of the vagina  (vulva) may be affected as well.  Pain or a burning feeling while urinating.  Pain during sex. How is this diagnosed? This condition is diagnosed based on:  Your medical history.  A physical exam.  A pelvic exam. Your health care provider will examine a sample of your vaginal discharge under a microscope. Your health care provider may send this sample for testing to confirm the diagnosis. How is this treated? This condition is treated with medicine. Medicines may be over-the-counter or prescription. You may be told to use one or more of the following:  Medicine that is taken by mouth (orally).  Medicine that is applied as a cream (topically).  Medicine that is inserted directly into the vagina (suppository). Follow these instructions at home:  Lifestyle  Do not have sex until your health care provider approves. Tell your sex partner that you have a yeast infection. That person should go to his or her health care provider and ask if they should also be treated.  Do not wear tight clothes, such as pantyhose or tight pants.  Wear breathable cotton underwear. General instructions  Take or apply over-the-counter and prescription medicines only as told by your health care provider.  Eat more yogurt. This may help to keep your yeast infection from returning.  Do not use tampons until your health care provider approves.  Try taking a sitz bath to help with discomfort. This is a warm water bath that is taken while you are sitting down. The water should only come up to your hips and should cover your buttocks. Do this 3-4 times per day or as told  by your health care provider.  Do not douche.  If you have diabetes, keep your blood sugar levels under control.  Keep all follow-up visits as told by your health care provider. This is important. Contact a health care provider if:  You have a fever.  Your symptoms go away and then return.  Your symptoms do not get better with  treatment.  Your symptoms get worse.  You have new symptoms.  You develop blisters in or around your vagina.  You have blood coming from your vagina and it is not your menstrual period.  You develop pain in your abdomen. Summary  Vaginal yeast infection is a condition that causes discharge as well as soreness, swelling, and redness (inflammation) of the vagina.  This condition is treated with medicine. Medicines may be over-the-counter or prescription.  Take or apply over-the-counter and prescription medicines only as told by your health care provider.  Do not douche. Do not have sex or use tampons until your health care provider approves.  Contact a health care provider if your symptoms do not get better with treatment or your symptoms go away and then return. This information is not intended to replace advice given to you by your health care provider. Make sure you discuss any questions you have with your health care provider. Document Released: 03/09/2005 Document Revised: 10/16/2017 Document Reviewed: 10/16/2017 Elsevier Patient Education  2020 [employer].

## 2019-04-09 LAB — RPR: RPR Ser Ql: NONREACTIVE

## 2019-04-10 LAB — GC/CHLAMYDIA PROBE AMP (~~LOC~~) NOT AT ARMC
Chlamydia: NEGATIVE
Comment: NEGATIVE
Comment: NORMAL
Neisseria Gonorrhea: NEGATIVE

## 2019-06-14 NOTE — L&D Delivery Note (Addendum)
OB/GYN Faculty Practice Delivery Note  Monique Harris is a 29 y.o. [id number] s/p VBAC at [redacted]w[redacted]d. She was admitted for SOL.   ROM: 0h 19m with clear fluid GBS Status: Negative Maximum Maternal Temperature: Unknown  Labor Progress: . Presented with contractions and found to be in SOL and dilated to 7 cm.  Quickly progressed to completed.  SROMed while pushing.   Delivery Date/Time: 11/14/2019 Delivery: Called to room and patient was complete and pushing. Head delivered ROA. Tight nuchal cord present, delivered through. Shoulder and body delivered in usual fashion. Infant with spontaneous cry, placed on mother's abdomen, dried and stimulated. Cord clamped x 2 after 1-minute delay, and cut by me. Cord blood drawn. Placenta delivered spontaneously with gentle cord traction. Fundus firm with massage and Pitocin. Labia, perineum, vagina, and cervix were inspected, found to be intact.   Placenta: Intact, 3 vessel cord Complications: None Lacerations: None EBL: 150 cc Analgesia: None  Infant: Viable female  APGARs 8/9  3090 g   EMILY Genene Churn, MD PGY-2 Resident, Family Medicine 11/14/2019, 6:32 PM  OB FELLOW DELIVERY ATTESTATION  I was gloved and present for the delivery in its entirety, and I agree with the above resident's note.    Jerilynn Birkenhead, MD [facility] Family Medicine Fellow, [facility] for [employer], [facility]

## 2019-06-21 ENCOUNTER — Other Ambulatory Visit: Payer: Self-pay

## 2019-06-21 ENCOUNTER — Ambulatory Visit (INDEPENDENT_AMBULATORY_CARE_PROVIDER_SITE_OTHER): Payer: Self-pay | Admitting: *Deleted

## 2019-06-21 DIAGNOSIS — Z641 Problems related to multiparity: Secondary | ICD-10-CM

## 2019-06-21 DIAGNOSIS — Z8759 Personal history of other complications of pregnancy, childbirth and the puerperium: Secondary | ICD-10-CM

## 2019-06-21 DIAGNOSIS — O09899 Supervision of other high risk pregnancies, unspecified trimester: Secondary | ICD-10-CM | POA: Insufficient documentation

## 2019-06-21 DIAGNOSIS — O0990 Supervision of high risk pregnancy, unspecified, unspecified trimester: Secondary | ICD-10-CM

## 2019-06-21 DIAGNOSIS — O09299 Supervision of pregnancy with other poor reproductive or obstetric history, unspecified trimester: Secondary | ICD-10-CM | POA: Insufficient documentation

## 2019-06-21 NOTE — Progress Notes (Signed)
I connected with  Monique Harris on 06/21/19 at 10:30 AM EST by telephone and verified that I am speaking with the correct person using two identifiers.   I discussed the limitations, risks, security and privacy concerns of performing an evaluation and management service by telephone and the availability of in person appointments. I also discussed with the patient that there may be a patient responsible charge related to this service. The patient expressed understanding and agreed to proceed. Explained I am completing [name] today. We discussed Her EDD and that it is based on  sure LMP . I reviewed her allergies, meds, OB History, Medical /Surgical history, and appropriate screenings. I did inform her of [facility] services if needed.  I explained I will send her the [employer] app- app sent to her while on phone- she will download if after we end the phone call.  I explained we will send a blood pressure cuff to [facility] once she has active Medicaid . Explained  then we will have her take her blood pressure weekly and enter into the app. Explained she will have some visits in office and some virtually. She already has MyChart but does not have the  App and would like help downloading it in the office. I reviewed her new ob  appointment date/ time with her , our location and to wear mask, no visitors.  I explained she will have a pelvic exam, ob bloodwork, hemoglobin a1C, cbg , genetic testing if desired,- she is undecided about if she wants  panorama,  pap if needed. I scheduled an [place] for first available appointment  At 07/11/18 and gave her the appointment. She voices understanding.    Monique Pippenger,RN 06/21/2019  10:33 AM

## 2019-06-21 NOTE — Progress Notes (Signed)
Chart reviewed for nurse visit. Agree with plan of care.   Judeth Horn, NP 06/21/2019 11:39 AM

## 2019-06-21 NOTE — Patient Instructions (Signed)

## 2019-06-24 ENCOUNTER — Encounter: Payer: Self-pay | Admitting: Student

## 2019-06-26 ENCOUNTER — Other Ambulatory Visit: Payer: Self-pay

## 2019-06-26 ENCOUNTER — Ambulatory Visit (INDEPENDENT_AMBULATORY_CARE_PROVIDER_SITE_OTHER): Payer: Self-pay | Admitting: Medical

## 2019-06-26 VITALS — BP 101/67 | HR 89 | Wt 156.0 lb

## 2019-06-26 DIAGNOSIS — Z8759 Personal history of other complications of pregnancy, childbirth and the puerperium: Secondary | ICD-10-CM

## 2019-06-26 DIAGNOSIS — O34219 Maternal care for unspecified type scar from previous cesarean delivery: Secondary | ICD-10-CM

## 2019-06-26 DIAGNOSIS — O09299 Supervision of pregnancy with other poor reproductive or obstetric history, unspecified trimester: Secondary | ICD-10-CM

## 2019-06-26 DIAGNOSIS — R87612 Low grade squamous intraepithelial lesion on cytologic smear of cervix (LGSIL): Secondary | ICD-10-CM

## 2019-06-26 DIAGNOSIS — O0992 Supervision of high risk pregnancy, unspecified, second trimester: Secondary | ICD-10-CM

## 2019-06-26 DIAGNOSIS — Z3A19 19 weeks gestation of pregnancy: Secondary | ICD-10-CM

## 2019-06-26 DIAGNOSIS — O09899 Supervision of other high risk pregnancies, unspecified trimester: Secondary | ICD-10-CM

## 2019-06-26 DIAGNOSIS — Z124 Encounter for screening for malignant neoplasm of cervix: Secondary | ICD-10-CM

## 2019-06-26 DIAGNOSIS — Z3009 Encounter for other general counseling and advice on contraception: Secondary | ICD-10-CM

## 2019-06-26 DIAGNOSIS — O09292 Supervision of pregnancy with other poor reproductive or obstetric history, second trimester: Secondary | ICD-10-CM

## 2019-06-26 DIAGNOSIS — O09892 Supervision of other high risk pregnancies, second trimester: Secondary | ICD-10-CM

## 2019-06-26 DIAGNOSIS — Z1151 Encounter for screening for human papillomavirus (HPV): Secondary | ICD-10-CM

## 2019-06-26 DIAGNOSIS — Z113 Encounter for screening for infections with a predominantly sexual mode of transmission: Secondary | ICD-10-CM

## 2019-06-26 DIAGNOSIS — O0990 Supervision of high risk pregnancy, unspecified, unspecified trimester: Secondary | ICD-10-CM

## 2019-06-26 NOTE — Progress Notes (Signed)
   PRENATAL VISIT NOTE  Subjective:  Monique Harris is a 29 y.o. [id number] at [redacted]w[redacted]d being seen today for her first prenatal visit for this pregnancy.  She is currently monitored for the following issues for this high-risk pregnancy and has History of cesarean delivery, currently pregnant; History of placenta abruption; Grand multiparity; Abnormal Pap smear of cervix; Precipitous delivery; Supervision of high risk pregnancy, antepartum; Short interval between pregnancies affecting pregnancy, antepartum; and History of shoulder dystocia in prior pregnancy, currently pregnant on their problem list.  Patient reports intermittent pelvic pressure.  Contractions: Not present. Vag. Bleeding: None.  Movement: Present. Denies leaking of fluid.   She is planning to bottle feed. Desires BTL for contraception.   The following portions of the patient's history were reviewed and updated as appropriate: allergies, current medications, past family history, past medical history, past social history, past surgical history and problem list.   Objective:   Vitals:   06/26/19 1347  BP: 101/67  Pulse: 89  Weight: 156 lb (70.8 kg)    Fetal Status: Fetal Heart Rate (bpm): 152   Movement: Present     General:  Alert, oriented and cooperative. Patient is in no acute distress.  Skin: Skin is warm and dry. No rash noted.   Cardiovascular: Normal heart rate and rhythm noted  Respiratory: Normal respiratory effort, no problems with respiration noted. Clear to auscultation.   Abdomen: Soft, gravid, appropriate for gestational age. Normal bowel sounds. Non-tender. Pain/Pressure: Absent     Pelvic: Cervical exam performed       Normal cervical contour, no lesions, no bleeding following pap, moderate amount of thin white and mucous discharge.   Extremities: Normal range of motion.  Edema: None  Mental Status: Normal mood and affect. Normal behavior. Normal judgment and thought content.   Assessment and Plan:    Pregnancy: [id number] at [redacted]w[redacted]d 1. Supervision of high risk pregnancy, antepartum - Obstetric Panel, Including HIV - Culture, OB Urine - Hemoglobin A1c - Cytology - PAP( [facility]) - Anatomy US scheduled 07/12/19 - Declined flu shot today   2. Short interval between pregnancies affecting pregnancy, antepartum - Last child delivered 03/2018  3. History of shoulder dystocia in prior pregnancy, currently pregnant  4. History of placenta abruption  5. History of cesarean delivery, currently pregnant - SVD x 3, then C/S, then VBAC x 1 - Planning TOLAC  6. Low grade squamous intraepithelial lesion on cytologic smear of cervix (LGSIL) - Pap today   7. Unwanted Fertility - Desires BTL, needs consent at 28 weeks   Preterm labor/ second trimester warning symptoms and general obstetric precautions including but not limited to vaginal bleeding, contractions, leaking of fluid and fetal movement were reviewed in detail with the patient. Please refer to After Visit Summary for other counseling recommendations.   Return in about 4 weeks (around 07/24/2019) for Virtual, LOB.  Future Appointments  Date Time Provider Department Center  07/12/2019  1:00 PM [facility] NURSE [facility]  07/12/2019  1:00 PM [facility] [place] 3 [facility]    Vonzella Nipple, PA-C

## 2019-06-27 LAB — HEMOGLOBIN A1C
Est. average glucose Bld gHb Est-mCnc: 111 mg/dL
Hgb A1c MFr Bld: 5.5 % (ref 4.8–5.6)

## 2019-06-27 LAB — OBSTETRIC PANEL, INCLUDING HIV
Antibody Screen: NEGATIVE
Basophils Absolute: 0 10*3/uL (ref 0.0–0.2)
Basos: 1 %
EOS (ABSOLUTE): 0.2 10*3/uL (ref 0.0–0.4)
Eos: 2 %
HIV Screen 4th Generation wRfx: NONREACTIVE
Hematocrit: 30 % — ABNORMAL LOW (ref 34.0–46.6)
Hemoglobin: 10.1 g/dL — ABNORMAL LOW (ref 11.1–15.9)
Hepatitis B Surface Ag: NEGATIVE
Immature Grans (Abs): 0 10*3/uL (ref 0.0–0.1)
Immature Granulocytes: 0 %
Lymphocytes Absolute: 2.1 10*3/uL (ref 0.7–3.1)
Lymphs: 24 %
MCH: 27.7 pg (ref 26.6–33.0)
MCHC: 33.7 g/dL (ref 31.5–35.7)
MCV: 82 fL (ref 79–97)
Monocytes Absolute: 0.6 10*3/uL (ref 0.1–0.9)
Monocytes: 7 %
Neutrophils Absolute: 5.9 10*3/uL (ref 1.4–7.0)
Neutrophils: 66 %
Platelets: 315 10*3/uL (ref 150–450)
RBC: 3.65 x10E6/uL — ABNORMAL LOW (ref 3.77–5.28)
RDW: 13 % (ref 11.7–15.4)
RPR Ser Ql: NONREACTIVE
Rh Factor: POSITIVE
Rubella Antibodies, IGG: 14.6 index (ref >0.99)
WBC: 8.9 10*3/uL (ref 3.4–10.8)

## 2019-07-01 ENCOUNTER — Encounter: Payer: Self-pay | Admitting: Medical

## 2019-07-01 DIAGNOSIS — B977 Papillomavirus as the cause of diseases classified elsewhere: Secondary | ICD-10-CM | POA: Insufficient documentation

## 2019-07-01 LAB — CYTOLOGY - PAP
Chlamydia: NEGATIVE
Comment: NEGATIVE
Comment: NEGATIVE
Comment: NEGATIVE
Comment: NORMAL
Diagnosis: NEGATIVE
HPV 16: NEGATIVE
HPV 18 / 45: NEGATIVE
High risk HPV: POSITIVE — AB
Neisseria Gonorrhea: NEGATIVE

## 2019-07-02 LAB — URINE CULTURE, OB REFLEX

## 2019-07-02 LAB — CULTURE, OB URINE

## 2019-07-10 ENCOUNTER — Encounter: Payer: Self-pay | Admitting: General Practice

## 2019-07-11 ENCOUNTER — Ambulatory Visit (HOSPITAL_COMMUNITY): Payer: Self-pay

## 2019-07-12 ENCOUNTER — Encounter (HOSPITAL_COMMUNITY): Payer: Self-pay

## 2019-07-12 ENCOUNTER — Ambulatory Visit (HOSPITAL_COMMUNITY)
Admission: RE | Admit: 2019-07-12 | Discharge: 2019-07-12 | Disposition: A | Discharge status: Home / Self Care | Payer: Medicaid Other | Source: Ambulatory Visit | Attending: Obstetrics and Gynecology | Admitting: Obstetrics and Gynecology

## 2019-07-12 ENCOUNTER — Ambulatory Visit (HOSPITAL_COMMUNITY): Payer: Medicaid Other | Admitting: *Deleted

## 2019-07-12 ENCOUNTER — Other Ambulatory Visit: Payer: Self-pay

## 2019-07-12 DIAGNOSIS — O09292 Supervision of pregnancy with other poor reproductive or obstetric history, second trimester: Secondary | ICD-10-CM

## 2019-07-12 DIAGNOSIS — O09299 Supervision of pregnancy with other poor reproductive or obstetric history, unspecified trimester: Secondary | ICD-10-CM | POA: Insufficient documentation

## 2019-07-12 DIAGNOSIS — O0990 Supervision of high risk pregnancy, unspecified, unspecified trimester: Secondary | ICD-10-CM | POA: Insufficient documentation

## 2019-07-12 DIAGNOSIS — Z641 Problems related to multiparity: Secondary | ICD-10-CM | POA: Insufficient documentation

## 2019-07-12 DIAGNOSIS — Z3A21 21 weeks gestation of pregnancy: Secondary | ICD-10-CM

## 2019-07-12 DIAGNOSIS — Z8759 Personal history of other complications of pregnancy, childbirth and the puerperium: Secondary | ICD-10-CM | POA: Diagnosis present

## 2019-07-12 DIAGNOSIS — O09892 Supervision of other high risk pregnancies, second trimester: Secondary | ICD-10-CM

## 2019-07-12 DIAGNOSIS — O09899 Supervision of other high risk pregnancies, unspecified trimester: Secondary | ICD-10-CM | POA: Insufficient documentation

## 2019-07-12 DIAGNOSIS — O34219 Maternal care for unspecified type scar from previous cesarean delivery: Secondary | ICD-10-CM

## 2019-07-24 ENCOUNTER — Encounter: Payer: Self-pay | Admitting: Nurse Practitioner

## 2019-07-24 ENCOUNTER — Other Ambulatory Visit: Payer: Self-pay

## 2019-07-24 ENCOUNTER — Telehealth (INDEPENDENT_AMBULATORY_CARE_PROVIDER_SITE_OTHER): Payer: Self-pay | Admitting: Nurse Practitioner

## 2019-07-24 DIAGNOSIS — O09899 Supervision of other high risk pregnancies, unspecified trimester: Secondary | ICD-10-CM

## 2019-07-24 DIAGNOSIS — O34219 Maternal care for unspecified type scar from previous cesarean delivery: Secondary | ICD-10-CM

## 2019-07-24 DIAGNOSIS — Z3A23 23 weeks gestation of pregnancy: Secondary | ICD-10-CM

## 2019-07-24 DIAGNOSIS — O219 Vomiting of pregnancy, unspecified: Secondary | ICD-10-CM

## 2019-07-24 DIAGNOSIS — O0990 Supervision of high risk pregnancy, unspecified, unspecified trimester: Secondary | ICD-10-CM

## 2019-07-24 DIAGNOSIS — O09299 Supervision of pregnancy with other poor reproductive or obstetric history, unspecified trimester: Secondary | ICD-10-CM

## 2019-07-24 NOTE — Progress Notes (Signed)
Pt states does not have BP Cuff, per notes, order when she has Pregnancy Medicaid. Asked if she has Medicaid she states still working on it.

## 2019-07-24 NOTE — Patient Instructions (Addendum)
For Vomiting: Tums Prilosec Vitamin B6   Heartburn Heartburn is a type of pain or discomfort that can happen in the throat or chest. It is often described as a burning pain. It may also cause a bad, acid-like taste in the mouth. Heartburn may feel worse when you lie down or bend over. It may be worse at night. It may be caused by stomach contents that move back up (reflux) into the tube that connects the mouth with the stomach (esophagus). Follow these instructions at home: Eating and drinking   Avoid certain foods and drinks as told by your doctor. This may include: ? Coffee and tea (with or without caffeine). ? Drinks that have alcohol. ? Energy drinks and sports drinks. ? Carbonated drinks or sodas. ? Chocolate and cocoa. ? Peppermint and mint flavorings. ? Garlic and onions. ? Horseradish. ? Spicy and acidic foods, such as:  Peppers.  Chili powder and curry powder.  Vinegar.  Hot sauces and BBQ sauce. ? Citrus fruit juices and citrus fruits, such as:  Oranges.  Lemons.  Limes. ? Tomato-based foods, such as:  Red sauce and pizza with red sauce.  Chili.  Salsa. ? Fried and fatty foods, such as:  Donuts.  [place] fries and potato chips.  High-fat dressings. ? High-fat meats, such as:  Hot dogs and sausage.  Rib eye steak.  Ham and bacon. ? High-fat dairy items, such as:  Whole milk.  Butter.  Cream cheese.  Eat small meals often. Avoid eating large meals.  Avoid drinking large amounts of liquid with your meals.  Avoid eating meals during the 2-3 hours before bedtime.  Avoid lying down right after you eat.  Do not exercise right after you eat. Lifestyle      If you are overweight, lose an amount of weight that is healthy for you. Ask your doctor about a safe weight loss goal.  Do not use any products that contain nicotine or tobacco, including cigarettes, e-cigarettes, and chewing tobacco. These can make your symptoms worse. If you need  help quitting, ask your doctor.  Wear loose clothes. Do not wear anything tight around your waist.  Raise (elevate) the head of your bed about 6 inches (15 cm) when you sleep.  Try to lower your stress. If you need help doing this, ask your doctor. General instructions  Pay attention to any changes in your symptoms.  Take over-the-counter and prescription medicines only as told by your doctor. ? Do not take aspirin, ibuprofen, or other NSAIDs unless your doctor says it is okay. ? Stop medicines only as told by your doctor.  Keep all follow-up visits as told by your doctor. This is important. Contact a doctor if:  You have new symptoms.  You lose weight and you do not know why it is happening.  You have trouble swallowing, or it hurts to swallow.  You have wheezing or a cough that keeps happening.  Your symptoms do not get better with treatment.  You have heartburn often for more than 2 weeks. Get help right away if:  You have pain in your arms, neck, jaw, teeth, or back.  You feel sweaty, dizzy, or light-headed.  You have chest pain or shortness of breath.  You throw up (vomit) and your throw up looks like blood or coffee grounds.  Your poop (stool) is bloody or black. These symptoms may represent a serious problem that is an emergency. Do not wait to see if the symptoms will go away.  Get medical help right away. Call your local emergency services (911 in the [place] Do not drive yourself to the hospital. Summary  Heartburn is a type of pain that can happen in the throat or chest. It can feel like a burning pain. It may also cause a bad, acid-like taste in the mouth.  You may need to avoid certain foods and drinks to help your symptoms. Ask your doctor what foods and drinks you should avoid.  Take over-the-counter and prescription medicines only as told by your doctor. Do not take aspirin, ibuprofen, or other NSAIDs unless your doctor told you to do so.  Contact your  doctor if your symptoms do not get better or they get worse. This information is not intended to replace advice given to you by your health care provider. Make sure you discuss any questions you have with your health care provider. Document Revised: 10/30/2017 Document Reviewed: 10/30/2017 Elsevier Patient Education  2020 [employer].

## 2019-07-24 NOTE — Progress Notes (Signed)
I connected with@ on 07/24/19 at  4:15 PM EST by: Mychart and verified that I am speaking with the correct person using two identifiers.  Patient is located at home and provider is located at [facility]     The purpose of this virtual visit is to provide medical care while limiting exposure to the novel coronavirus. I discussed the limitations, risks, security and privacy concerns of performing an evaluation and management service by MyChart and the availability of in person appointments. I also discussed with the patient that there may be a patient responsible charge related to this service. By engaging in this virtual visit, you consent to the provision of healthcare.  Additionally, you authorize for your insurance to be billed for the services provided during this visit.  The patient expressed understanding and agreed to proceed.  The following staff members participated in the virtual visit:  Marykay Lex, CMA and Nolene Bernheim, NP    PRENATAL VISIT NOTE  Subjective:  Monique Harris is a 29 y.o. G31D1761 at [redacted]w[redacted]d  for phone visit for ongoing prenatal care.  She is currently monitored for the following issues for this high-risk pregnancy and has History of cesarean delivery, currently pregnant; History of placenta abruption; Grand multiparity; Abnormal Pap smear of cervix; Precipitous delivery; Supervision of high risk pregnancy, antepartum; Short interval between pregnancies affecting pregnancy, antepartum; History of shoulder dystocia in prior pregnancy, currently pregnant; and HPV in female on their problem list.  Patient reports heartburn, nausea and vomiting.  Contractions: Not present. Vag. Bleeding: None.  Movement: Present. Denies leaking of fluid.   The following portions of the patient's history were reviewed and updated as appropriate: allergies, current medications, past family history, past medical history, past social history, past surgical history and problem list.   Objective:    There were no vitals filed for this visit. Self-Obtained  Does not yet have BP cuff  Fetal Status:     Movement: Present     Assessment and Plan:  Pregnancy: [id number] at [redacted]w[redacted]d 1. Supervision of high risk pregnancy, antepartum Is not taking prenatal vitamins due to nausea Does not yet have Medicaid or BP cuff Will check again at her next visit  2. Short interval between pregnancies affecting pregnancy, antepartum Plans on BTL - will sign papers at 28 weeks - will make appointment with MD for informed consent  3. History of shoulder dystocia in prior pregnancy, currently pregnant 2 hr GTT next visit - advised she should be fasting after midnight  4.  Nausea and vomiting in pregnancy Does not yet have Medicaid - working on it -  Reviewed OTC remedies for heartburn and vomiting.  Says that heartburn seems to be causing her nausea.  Advised no fried foods as that probably makes it worse.  Preterm labor symptoms and general obstetric precautions including but not limited to vaginal bleeding, contractions, leaking of fluid and fetal movement were reviewed in detail with the patient.  Return in about 4 weeks (around 08/21/2019) for MD Appt ROB early AM appointment for Fasting 2 hr GTT and TOLAC consent.  Future Appointments  Date Time Provider Department Center  08/22/2019  8:15 AM Adam Phenix, MD [facility]  08/22/2019  9:30 AM [facility]     Time spent on virtual visit: 6 minutes  [name], NP

## 2019-07-24 NOTE — Progress Notes (Signed)
I connected with  [name] on 07/24/19 at  4:15 PM EST by telephone and verified that I am speaking with the correct person using two identifiers.   I discussed the limitations, risks, security and privacy concerns of performing an evaluation and management service by telephone and the availability of in person appointments. I also discussed with the patient that there may be a patient responsible charge related to this service. The patient expressed understanding and agreed to proceed.  Henrietta Dine, CMA 07/24/2019  4:22 PM

## 2019-08-21 ENCOUNTER — Other Ambulatory Visit: Payer: Self-pay

## 2019-08-21 DIAGNOSIS — O0990 Supervision of high risk pregnancy, unspecified, unspecified trimester: Secondary | ICD-10-CM

## 2019-08-22 ENCOUNTER — Encounter: Payer: Self-pay | Admitting: Obstetrics & Gynecology

## 2019-08-22 ENCOUNTER — Other Ambulatory Visit: Payer: Self-pay

## 2019-09-04 ENCOUNTER — Encounter: Payer: Self-pay | Admitting: Obstetrics and Gynecology

## 2019-09-04 ENCOUNTER — Other Ambulatory Visit: Payer: Self-pay

## 2019-09-05 ENCOUNTER — Other Ambulatory Visit: Payer: Self-pay

## 2019-09-05 ENCOUNTER — Encounter: Payer: Self-pay | Admitting: Obstetrics and Gynecology

## 2019-09-09 ENCOUNTER — Other Ambulatory Visit: Payer: Self-pay

## 2019-09-16 ENCOUNTER — Other Ambulatory Visit: Payer: Self-pay

## 2019-09-16 ENCOUNTER — Encounter: Payer: Self-pay | Admitting: Nurse Practitioner

## 2019-09-16 ENCOUNTER — Ambulatory Visit (INDEPENDENT_AMBULATORY_CARE_PROVIDER_SITE_OTHER): Payer: Self-pay | Admitting: Nurse Practitioner

## 2019-09-16 VITALS — BP 117/72 | HR 90 | Wt 151.0 lb

## 2019-09-16 DIAGNOSIS — O26899 Other specified pregnancy related conditions, unspecified trimester: Secondary | ICD-10-CM

## 2019-09-16 DIAGNOSIS — Z23 Encounter for immunization: Secondary | ICD-10-CM

## 2019-09-16 DIAGNOSIS — O0990 Supervision of high risk pregnancy, unspecified, unspecified trimester: Secondary | ICD-10-CM

## 2019-09-16 DIAGNOSIS — K130 Diseases of lips: Secondary | ICD-10-CM

## 2019-09-16 DIAGNOSIS — O99613 Diseases of the digestive system complicating pregnancy, third trimester: Secondary | ICD-10-CM

## 2019-09-16 DIAGNOSIS — O26893 Other specified pregnancy related conditions, third trimester: Secondary | ICD-10-CM

## 2019-09-16 DIAGNOSIS — R12 Heartburn: Secondary | ICD-10-CM

## 2019-09-16 DIAGNOSIS — Z3A30 30 weeks gestation of pregnancy: Secondary | ICD-10-CM

## 2019-09-16 DIAGNOSIS — N898 Other specified noninflammatory disorders of vagina: Secondary | ICD-10-CM

## 2019-09-16 MED ORDER — TERCONAZOLE 0.4 % VA CREA: 1.0000 | TOPICAL_CREAM | Freq: Every day | VAGINAL | 1 refills | Status: AC

## 2019-09-16 MED ORDER — OMEPRAZOLE 40 MG PO CPDR: 40.0000 mg | DELAYED_RELEASE_CAPSULE | Freq: Every day | ORAL | 2 refills | Status: AC

## 2019-09-16 NOTE — Progress Notes (Signed)
Patient reports significant heartburn/nausea/vomiting- states Tums or Zantac doesn't help

## 2019-09-16 NOTE — Progress Notes (Signed)
    Subjective:  Monique Harris is a 29 y.o. [id number] at [redacted]w[redacted]d being seen today for ongoing prenatal care.  She is currently monitored for the following issues for this high-risk pregnancy and has History of cesarean delivery, currently pregnant; History of placenta abruption; Grand multiparity; Abnormal Pap smear of cervix; Precipitous delivery; Supervision of high risk pregnancy, antepartum; Short interval between pregnancies affecting pregnancy, antepartum; History of shoulder dystocia in prior pregnancy, currently pregnant; and HPV in female on their problem list.  Patient reports heartburn, nausea and vomiting.  Contractions: Not present. Vag. Bleeding: None.  Movement: Present. Denies leaking of fluid.   The following portions of the patient's history were reviewed and updated as appropriate: allergies, current medications, past family history, past medical history, past social history, past surgical history and problem list. Problem list updated.  Objective:   Vitals:   09/16/19 0836  BP: 117/72  Pulse: 90  Weight: 151 lb (68.5 kg)    Fetal Status: Fetal Heart Rate (bpm): 159   Movement: Present     General:  Alert, oriented and cooperative. Patient is in no acute distress.  Skin: Skin is warm and dry. No rash noted.   Cardiovascular: Normal heart rate noted  Respiratory: Normal respiratory effort, no problems with respiration noted  Abdomen: Soft, gravid, appropriate for gestational age. Pain/Pressure: Absent     Pelvic:  Cervical exam deferred        Extremities: Normal range of motion.  Edema: None  Mental Status: Normal mood and affect. Normal behavior. Normal judgment and thought content.   Urinalysis:      Assessment and Plan:  Pregnancy: [id number] at [redacted]w[redacted]d  1. Supervision of high risk pregnancy, antepartum Biggest concern at this point is she does not yet have Medicaid. She is trying to get child support and that process is interfering with her Medicaid application.   She will check again today to see if she can get Pregnancy Medicaid.  Wants to have a tubal and whether or not she has Medicaid is influencing her decision about whether to have a C/S or vaginal birth.  Very much wants a tubal.  Advised she needs a pap smear in 2022 due to High Risk HPV and the importance of close follow up for pap to monitor any progression of HPV to cellular changes.  Free pap smear clinics are available if no insurance in 2022.  Still needs to sign tubal papers and VBAC consent - next appointment with MD  - Tdap vaccine greater than or equal to 7yo IM  2. Vaginal itching Had yeast seen on pap so will treat for yeast.  3. Heartburn during pregnancy, antepartum Tums is not helpful and she has been vomiting - no vomiting yesterday or today.  Has lost 5 pounds. Prescribed Prilosec daily and given Good RX prescription Reviewed common foods that worsen heartburn - juice, coffee, fried foods, pizza  4. Angular cheilitis Advised to use vaseline and keep it moisturized Expect it to improve once baby is born.   Preterm labor symptoms and general obstetric precautions including but not limited to vaginal bleeding, contractions, leaking of fluid and fetal movement were reviewed in detail with the patient. Please refer to After Visit Summary for other counseling recommendations.  Return in about 2 weeks (around 09/30/2019) for MD visit for delivery plan ROB.  Nolene Bernheim, RN, MSN, NP-BC Nurse Practitioner, [facility] for [employer], [facility] 09/16/2019 9:15 AM

## 2019-09-16 NOTE — Patient Instructions (Addendum)
https://www.cdc.gov/vaccines/hcp/vis/vis-statements/tdap.pdf">  Tdap (Tetanus, Diphtheria, Pertussis) Vaccine: What You Need to Know 1. Why get vaccinated? Tdap vaccine can prevent tetanus, diphtheria, and pertussis. Diphtheria and pertussis spread from person to person. Tetanus enters the body through cuts or wounds.  TETANUS (T) causes painful stiffening of the muscles. Tetanus can lead to serious health problems, including being unable to open the mouth, having trouble swallowing and breathing, or death.  DIPHTHERIA (D) can lead to difficulty breathing, heart failure, paralysis, or death.  PERTUSSIS (aP), also known as "whooping cough," can cause uncontrollable, violent coughing which makes it hard to breathe, eat, or drink. Pertussis can be extremely serious in babies and young children, causing pneumonia, convulsions, brain damage, or death. In teens and adults, it can cause weight loss, loss of bladder control, passing out, and rib fractures from severe coughing. 2. Tdap vaccine Tdap is only for children 7 years and older, adolescents, and adults.  Adolescents should receive a single dose of Tdap, preferably at age 53 or 35 years. Pregnant women should get a dose of Tdap during every pregnancy, to protect the newborn from pertussis. Infants are most at risk for severe, life-threatening complications from pertussis. Adults who have never received Tdap should get a dose of Tdap. Also, adults should receive a booster dose every 10 years, or earlier in the case of a severe and dirty wound or burn. Booster doses can be either Tdap or Td (a different vaccine that protects against tetanus and diphtheria but not pertussis). Tdap may be given at the same time as other vaccines. 3. Talk with your health care provider Tell your vaccine provider if the person getting the vaccine:  Has had an allergic reaction after a previous dose of any vaccine that protects against tetanus, diphtheria, or pertussis,  or has any severe, life-threatening allergies.  Has had a coma, decreased level of consciousness, or prolonged seizures within 7 days after a previous dose of any pertussis vaccine (DTP, DTaP, or Tdap).  Has seizures or another nervous system problem.  Has ever had Guillain-Barr Syndrome (also called GBS).  Has had severe pain or swelling after a previous dose of any vaccine that protects against tetanus or diphtheria. In some cases, your health care provider may decide to postpone Tdap vaccination to a future visit.  People with minor illnesses, such as a cold, may be vaccinated. People who are moderately or severely ill should usually wait until they recover before getting Tdap vaccine.  Your health care provider can give you more information. 4. Risks of a vaccine reaction  Pain, redness, or swelling where the shot was given, mild fever, headache, feeling tired, and nausea, vomiting, diarrhea, or stomachache sometimes happen after Tdap vaccine. People sometimes faint after medical procedures, including vaccination. Tell your provider if you feel dizzy or have vision changes or ringing in the ears.  As with any medicine, there is a very remote chance of a vaccine causing a severe allergic reaction, other serious injury, or death. 5. What if there is a serious problem? An allergic reaction could occur after the vaccinated person leaves the clinic. If you see signs of a severe allergic reaction (hives, swelling of the face and throat, difficulty breathing, a fast heartbeat, dizziness, or weakness), call 9-1-1 and get the person to the nearest hospital. For other signs that concern you, call your health care provider.  Adverse reactions should be reported to the Vaccine Adverse Event Reporting System (VAERS). Your health care provider will usually file this report,  or you can do it yourself. Visit the VAERS website at www.vaers.SamedayNews.es or call (605) 838-6819. VAERS is only for reporting  reactions, and VAERS staff do not give medical advice. 6. The National Vaccine Injury Compensation Program The [employer] Vaccine Injury Compensation Program (VICP) is a federal program that was created to compensate people who may have been injured by certain vaccines. Visit the VICP website at GoldCloset.com.ee or call (650)623-7709 to learn about the program and about filing a claim. There is a time limit to file a claim for compensation. 7. How can I learn more?  Ask your health care provider.  Call your local or state health department.  Contact the Centers for Disease Control and Prevention (CDC): ? Call (680) 680-9328 (1-800-CDC-INFO) or ? Visit CDC's website at http://hunter.com/ Vaccine Information Statement Tdap (Tetanus, Diphtheria, Pertussis) Vaccine (09/12/2018) This information is not intended to replace advice given to you by your health care provider. Make sure you discuss any questions you have with your health care provider. Document Revised: 09/21/2018 Document Reviewed: 09/24/2018 Elsevier Patient Education  [place].  Heartburn Heartburn is a type of pain or discomfort that can happen in the throat or chest. It is often described as a burning pain. It may also cause a bad, acid-like taste in the mouth. Heartburn may feel worse when you lie down or bend over. It may be worse at night. It may be caused by stomach contents that move back up (reflux) into the tube that connects the mouth with the stomach (esophagus). Follow these instructions at home: Eating and drinking   Avoid certain foods and drinks as told by your doctor. This may include: ? Coffee and tea (with or without caffeine). ? Drinks that have alcohol. ? Energy drinks and sports drinks. ? Carbonated drinks or sodas. ? Chocolate and cocoa. ? Peppermint and mint flavorings. ? Garlic and onions. ? Horseradish. ? Spicy and acidic foods, such as:  Peppers.  Chili powder and curry  powder.  Vinegar.  Hot sauces and BBQ sauce. ? Citrus fruit juices and citrus fruits, such as:  Oranges.  Lemons.  Limes. ? Tomato-based foods, such as:  Red sauce and pizza with red sauce.  Chili.  Salsa. ? Fried and fatty foods, such as:  Donuts.  [place] fries and potato chips.  High-fat dressings. ? High-fat meats, such as:  Hot dogs and sausage.  Rib eye steak.  Ham and bacon. ? High-fat dairy items, such as:  Whole milk.  Butter.  Cream cheese.  Eat small meals often. Avoid eating large meals.  Avoid drinking large amounts of liquid with your meals.  Avoid eating meals during the 2-3 hours before bedtime.  Avoid lying down right after you eat.  Do not exercise right after you eat. Lifestyle      If you are overweight, lose an amount of weight that is healthy for you. Ask your doctor about a safe weight loss goal.  Do not use any products that contain nicotine or tobacco, including cigarettes, e-cigarettes, and chewing tobacco. These can make your symptoms worse. If you need help quitting, ask your doctor.  Wear loose clothes. Do not wear anything tight around your waist.  Raise (elevate) the head of your bed about 6 inches (15 cm) when you sleep.  Try to lower your stress. If you need help doing this, ask your doctor. General instructions  Pay attention to any changes in your symptoms.  Take over-the-counter and prescription medicines only as told by your doctor. ?  Do not take aspirin, ibuprofen, or other NSAIDs unless your doctor says it is okay. ? Stop medicines only as told by your doctor.  Keep all follow-up visits as told by your doctor. This is important. Contact a doctor if:  You have new symptoms.  You lose weight and you do not know why it is happening.  You have trouble swallowing, or it hurts to swallow.  You have wheezing or a cough that keeps happening.  Your symptoms do not get better with treatment.  You have  heartburn often for more than 2 weeks. Get help right away if:  You have pain in your arms, neck, jaw, teeth, or back.  You feel sweaty, dizzy, or light-headed.  You have chest pain or shortness of breath.  You throw up (vomit) and your throw up looks like blood or coffee grounds.  Your poop (stool) is bloody or black. These symptoms may represent a serious problem that is an emergency. Do not wait to see if the symptoms will go away. Get medical help right away. Call your local emergency services (911 in the [place] Do not drive yourself to the hospital. Summary  Heartburn is a type of pain that can happen in the throat or chest. It can feel like a burning pain. It may also cause a bad, acid-like taste in the mouth.  You may need to avoid certain foods and drinks to help your symptoms. Ask your doctor what foods and drinks you should avoid.  Take over-the-counter and prescription medicines only as told by your doctor. Do not take aspirin, ibuprofen, or other NSAIDs unless your doctor told you to do so.  Contact your doctor if your symptoms do not get better or they get worse. This information is not intended to replace advice given to you by your health care provider. Make sure you discuss any questions you have with your health care provider. Document Revised: 10/30/2017 Document Reviewed: 10/30/2017 Elsevier Patient Education  2020 [employer].

## 2019-09-17 ENCOUNTER — Other Ambulatory Visit: Payer: Self-pay | Admitting: Obstetrics & Gynecology

## 2019-09-17 DIAGNOSIS — O0990 Supervision of high risk pregnancy, unspecified, unspecified trimester: Secondary | ICD-10-CM

## 2019-09-17 LAB — CBC
Hematocrit: 28.5 % — ABNORMAL LOW (ref 34.0–46.6)
Hemoglobin: 9.2 g/dL — ABNORMAL LOW (ref 11.1–15.9)
MCH: 26.3 pg — ABNORMAL LOW (ref 26.6–33.0)
MCHC: 32.3 g/dL (ref 31.5–35.7)
MCV: 81 fL (ref 79–97)
Platelets: 268 10*3/uL (ref 150–450)
RBC: 3.5 x10E6/uL — ABNORMAL LOW (ref 3.77–5.28)
RDW: 13 % (ref 11.7–15.4)
WBC: 6.2 10*3/uL (ref 3.4–10.8)

## 2019-09-17 LAB — GLUCOSE TOLERANCE, 2 HOURS W/ 1HR
Glucose, 1 hour: 128 mg/dL (ref 65–179)
Glucose, 2 hour: 102 mg/dL (ref 65–152)
Glucose, Fasting: 84 mg/dL (ref 65–91)

## 2019-09-17 LAB — HIV ANTIBODY (ROUTINE TESTING W REFLEX): HIV Screen 4th Generation wRfx: NONREACTIVE

## 2019-09-17 LAB — RPR: RPR Ser Ql: NONREACTIVE

## 2019-09-17 MED ORDER — FERROUS SULFATE 325 (65 FE) MG PO TABS: 325.0000 mg | ORAL_TABLET | Freq: Every day | ORAL | 3 refills | Status: AC

## 2019-09-23 ENCOUNTER — Telehealth: Payer: Self-pay | Admitting: Family Medicine

## 2019-09-23 NOTE — Telephone Encounter (Signed)
Patient would like to speak with a nurse, she said she think she lost her mucus plug

## 2019-09-24 NOTE — Telephone Encounter (Signed)
I called [place] back. I asked her to describe what is going on. She reports she is not really seeing discharge but sometimes when she wipes she does see discharge on the toilet paper. States she was sitting in tub and noticed a glob of something that felt slimy, mucousy. I asked if she is having contractions or leaking clear fluid like water. She states she only has occasional mild braxton hicks contractions. She denies leaking fluid or having a foul smelling , unusual discharge. I explained what she saw could have been yeast discharge or her vaginal medicine she is taking for yeast; or normal vaginal discharge.  I instructed her if she starts having frequent contractions or leaking fluid to go to hospital for evaluation.  If she has foul smelling or usual vaginal discharge to get that evaluated. She voices understanding. Reganne Messerschmidt,RN

## 2019-09-30 ENCOUNTER — Encounter: Payer: Self-pay | Admitting: Obstetrics & Gynecology

## 2019-10-23 ENCOUNTER — Other Ambulatory Visit: Payer: Self-pay

## 2019-10-23 ENCOUNTER — Encounter: Payer: Self-pay | Admitting: General Practice

## 2019-10-23 ENCOUNTER — Encounter: Payer: Self-pay | Admitting: Obstetrics and Gynecology

## 2019-10-23 ENCOUNTER — Telehealth: Payer: Self-pay | Admitting: Obstetrics and Gynecology

## 2019-10-23 ENCOUNTER — Ambulatory Visit (INDEPENDENT_AMBULATORY_CARE_PROVIDER_SITE_OTHER): Payer: Medicaid Other | Admitting: Obstetrics and Gynecology

## 2019-10-23 ENCOUNTER — Other Ambulatory Visit (HOSPITAL_COMMUNITY)
Admission: RE | Admit: 2019-10-23 | Discharge: 2019-10-23 | Disposition: A | Discharge status: Home / Self Care | Payer: Medicaid Other | Source: Ambulatory Visit | Attending: Obstetrics and Gynecology | Admitting: Obstetrics and Gynecology

## 2019-10-23 VITALS — BP 111/67 | HR 67 | Wt 152.4 lb

## 2019-10-23 DIAGNOSIS — Z3A36 36 weeks gestation of pregnancy: Secondary | ICD-10-CM

## 2019-10-23 DIAGNOSIS — O34219 Maternal care for unspecified type scar from previous cesarean delivery: Secondary | ICD-10-CM

## 2019-10-23 DIAGNOSIS — O0990 Supervision of high risk pregnancy, unspecified, unspecified trimester: Secondary | ICD-10-CM | POA: Insufficient documentation

## 2019-10-23 MED ORDER — ACYCLOVIR 5 % EX OINT: 1.0000 "application " | TOPICAL_OINTMENT | CUTANEOUS | 1 refills | Status: DC

## 2019-10-23 NOTE — Telephone Encounter (Signed)
I called [place] home )  heard a message her voicemail is not set up.  I called [facility] to inquire about cost of Zovirax . And is $400 for brand name and $100 for generic. I discussed with Dr. Alysia Penna and we can send in rx for generic or she can buy over the counter medication such as Abreva for coldsores.  I called [place] again ( mobile ) and hear memory is full. Will send MyChart message. Samra Pesch,RN

## 2019-10-23 NOTE — Patient Instructions (Signed)
Third Trimester of Pregnancy The third trimester is from week 28 through week 40 (months 7 through 9). The third trimester is a time when the unborn baby (fetus) is growing rapidly. At the end of the ninth month, the fetus is about 20 inches in length and weighs 6-10 pounds. Body changes during your third trimester Your body will continue to go through many changes during pregnancy. The changes vary from woman to woman. During the third trimester:  Your weight will continue to increase. You can expect to gain 25-35 pounds (11-16 kg) by the end of the pregnancy.  You may begin to get stretch marks on your hips, abdomen, and breasts.  You may urinate more often because the fetus is moving lower into your pelvis and pressing on your bladder.  You may develop or continue to have heartburn. This is caused by increased hormones that slow down muscles in the digestive tract.  You may develop or continue to have constipation because increased hormones slow digestion and cause the muscles that push waste through your intestines to relax.  You may develop hemorrhoids. These are swollen veins (varicose veins) in the rectum that can itch or be painful.  You may develop swollen, bulging veins (varicose veins) in your legs.  You may have increased body aches in the pelvis, back, or thighs. This is due to weight gain and increased hormones that are relaxing your joints.  You may have changes in your hair. These can include thickening of your hair, rapid growth, and changes in texture. Some women also have hair loss during or after pregnancy, or hair that feels dry or thin. Your hair will most likely return to normal after your baby is born.  Your breasts will continue to grow and they will continue to become tender. A yellow fluid (colostrum) may leak from your breasts. This is the first milk you are producing for your baby.  Your belly button may stick out.  You may notice more swelling in your hands,  face, or ankles.  You may have increased tingling or numbness in your hands, arms, and legs. The skin on your belly may also feel numb.  You may feel short of breath because of your expanding uterus.  You may have more problems sleeping. This can be caused by the size of your belly, increased need to urinate, and an increase in your body's metabolism.  You may notice the fetus "dropping," or moving lower in your abdomen (lightening).  You may have increased vaginal discharge.  You may notice your joints feel loose and you may have pain around your pelvic bone. What to expect at prenatal visits You will have prenatal exams every 2 weeks until week 36. Then you will have weekly prenatal exams. During a routine prenatal visit:  You will be weighed to make sure you and the baby are growing normally.  Your blood pressure will be taken.  Your abdomen will be measured to track your baby's growth.  The fetal heartbeat will be listened to.  Any test results from the previous visit will be discussed.  You may have a cervical check near your due date to see if your cervix has softened or thinned (effaced).  You will be tested for Group B streptococcus. This happens between 35 and 37 weeks. Your health care provider may ask you:  What your birth plan is.  How you are feeling.  If you are feeling the baby move.  If you have had any abnormal   symptoms, such as leaking fluid, bleeding, severe headaches, or abdominal cramping.  If you are using any tobacco products, including cigarettes, chewing tobacco, and electronic cigarettes.  If you have any questions. Other tests or screenings that may be performed during your third trimester include:  Blood tests that check for low iron levels (anemia).  Fetal testing to check the health, activity level, and growth of the fetus. Testing is done if you have certain medical conditions or if there are problems during the pregnancy.  Nonstress test  (NST). This test checks the health of your baby to make sure there are no signs of problems, such as the baby not getting enough oxygen. During this test, a belt is placed around your belly. The baby is made to move, and its heart rate is monitored during movement. What is false labor? False labor is a condition in which you feel small, irregular tightenings of the muscles in the womb (contractions) that usually go away with rest, changing position, or drinking water. These are called Braxton Hicks contractions. Contractions may last for hours, days, or even weeks before true labor sets in. If contractions come at regular intervals, become more frequent, increase in intensity, or become painful, you should see your health care provider. What are the signs of labor?  Abdominal cramps.  Regular contractions that start at 10 minutes apart and become stronger and more frequent with time.  Contractions that start on the top of the uterus and spread down to the lower abdomen and back.  Increased pelvic pressure and dull back pain.  A watery or bloody mucus discharge that comes from the vagina.  Leaking of amniotic fluid. This is also known as your "water breaking." It could be a slow trickle or a gush. Let your health care provider know if it has a color or strange odor. If you have any of these signs, call your health care provider right away, even if it is before your due date. Follow these instructions at home: Medicines  Follow your health care provider's instructions regarding medicine use. Specific medicines may be either safe or unsafe to take during pregnancy.  Take a prenatal vitamin that contains at least 600 micrograms (mcg) of folic acid.  If you develop constipation, try taking a stool softener if your health care provider approves. Eating and drinking   Eat a balanced diet that includes fresh fruits and vegetables, whole grains, good sources of protein such as meat, eggs, or tofu,  and low-fat dairy. Your health care provider will help you determine the amount of weight gain that is right for you.  Avoid raw meat and uncooked cheese. These carry germs that can cause birth defects in the baby.  If you have low calcium intake from food, talk to your health care provider about whether you should take a daily calcium supplement.  Eat four or five small meals rather than three large meals a day.  Limit foods that are high in fat and processed sugars, such as fried and sweet foods.  To prevent constipation: ? Drink enough fluid to keep your urine clear or pale yellow. ? Eat foods that are high in fiber, such as fresh fruits and vegetables, whole grains, and beans. Activity  Exercise only as directed by your health care provider. Most women can continue their usual exercise routine during pregnancy. Try to exercise for 30 minutes at least 5 days a week. Stop exercising if you experience uterine contractions.  Avoid heavy lifting.  Do   not exercise in extreme heat or humidity, or at high altitudes.  Wear low-heel, comfortable shoes.  Practice good posture.  You may continue to have sex unless your health care provider tells you otherwise. Relieving pain and discomfort  Take frequent breaks and rest with your legs elevated if you have leg cramps or low back pain.  Take warm sitz baths to soothe any pain or discomfort caused by hemorrhoids. Use hemorrhoid cream if your health care provider approves.  Wear a good support bra to prevent discomfort from breast tenderness.  If you develop varicose veins: ? Wear support pantyhose or compression stockings as told by your healthcare provider. ? Elevate your feet for 15 minutes, 3-4 times a day. Prenatal care  Write down your questions. Take them to your prenatal visits.  Keep all your prenatal visits as told by your health care provider. This is important. Safety  Wear your seat belt at all times when driving.  Make  a list of emergency phone numbers, including numbers for family, friends, the hospital, and police and fire departments. General instructions  Avoid cat litter boxes and soil used by cats. These carry germs that can cause birth defects in the baby. If you have a cat, ask someone to clean the litter box for you.  Do not travel far distances unless it is absolutely necessary and only with the approval of your health care provider.  Do not use hot tubs, steam rooms, or saunas.  Do not drink alcohol.  Do not use any products that contain nicotine or tobacco, such as cigarettes and e-cigarettes. If you need help quitting, ask your health care provider.  Do not use any medicinal herbs or unprescribed drugs. These chemicals affect the formation and growth of the baby.  Do not douche or use tampons or scented sanitary pads.  Do not cross your legs for long periods of time.  To prepare for the arrival of your baby: ? Take prenatal classes to understand, practice, and ask questions about labor and delivery. ? Make a trial run to the hospital. ? Visit the hospital and tour the maternity area. ? Arrange for maternity or paternity leave through employers. ? Arrange for family and friends to take care of pets while you are in the hospital. ? Purchase a rear-facing car seat and make sure you know how to install it in your car. ? Pack your hospital bag. ? Prepare the baby's nursery. Make sure to remove all pillows and stuffed animals from the baby's crib to prevent suffocation.  Visit your dentist if you have not gone during your pregnancy. Use a soft toothbrush to brush your teeth and be gentle when you floss. Contact a health care provider if:  You are unsure if you are in labor or if your water has broken.  You become dizzy.  You have mild pelvic cramps, pelvic pressure, or nagging pain in your abdominal area.  You have lower back pain.  You have persistent nausea, vomiting, or  diarrhea.  You have an unusual or bad smelling vaginal discharge.  You have pain when you urinate. Get help right away if:  Your water breaks before 37 weeks.  You have regular contractions less than 5 minutes apart before 37 weeks.  You have a fever.  You are leaking fluid from your vagina.  You have spotting or bleeding from your vagina.  You have severe abdominal pain or cramping.  You have rapid weight loss or weight gain.  You have   shortness of breath with chest pain.  You notice sudden or extreme swelling of your face, hands, ankles, feet, or legs.  Your baby makes fewer than 10 movements in 2 hours.  You have severe headaches that do not go away when you take medicine.  You have vision changes. Summary  The third trimester is from week 28 through week 40, months 7 through 9. The third trimester is a time when the unborn baby (fetus) is growing rapidly.  During the third trimester, your discomfort may increase as you and your baby continue to gain weight. You may have abdominal, leg, and back pain, sleeping problems, and an increased need to urinate.  During the third trimester your breasts will keep growing and they will continue to become tender. A yellow fluid (colostrum) may leak from your breasts. This is the first milk you are producing for your baby.  False labor is a condition in which you feel small, irregular tightenings of the muscles in the womb (contractions) that eventually go away. These are called Braxton Hicks contractions. Contractions may last for hours, days, or even weeks before true labor sets in.  Signs of labor can include: abdominal cramps; regular contractions that start at 10 minutes apart and become stronger and more frequent with time; watery or bloody mucus discharge that comes from the vagina; increased pelvic pressure and dull back pain; and leaking of amniotic fluid. This information is not intended to replace advice given to you by your  health care provider. Make sure you discuss any questions you have with your health care provider. Document Revised: 09/20/2018 Document Reviewed: 07/05/2016 Elsevier Patient Education  2020 [employer]  

## 2019-10-23 NOTE — Telephone Encounter (Signed)
Patient called to say she needed the Dr to change her Rx. The cost of this medication is $400, and she cannot afford it. She wants to know is there something cheaper that will work.

## 2019-10-23 NOTE — Progress Notes (Signed)
cSubjective:  Monique Harris is a 29 y.o. [id number] at [redacted]w[redacted]d being seen today for ongoing prenatal care.  She is currently monitored for the following issues for this low-risk pregnancy and has History of cesarean delivery, currently pregnant; History of placenta abruption; Grand multiparity; Abnormal Pap smear of cervix; Precipitous delivery; Supervision of high risk pregnancy, antepartum; Short interval between pregnancies affecting pregnancy, antepartum; History of shoulder dystocia in prior pregnancy, currently pregnant; and HPV in female on their problem list.  Patient reports general discomfort of pregnancy.  Contractions: Not present. Vag. Bleeding: None.  Movement: Present. Denies leaking of fluid.   The following portions of the patient's history were reviewed and updated as appropriate: allergies, current medications, past family history, past medical history, past social history, past surgical history and problem list. Problem list updated.  Objective:   Vitals:   10/23/19 1331  BP: 111/67  Pulse: 67  Weight: 152 lb 6.4 oz (69.1 kg)    Fetal Status: Fetal Heart Rate (bpm): 146   Movement: Present     General:  Alert, oriented and cooperative. Patient is in no acute distress.  Skin: Skin is warm and dry. No rash noted.   Cardiovascular: Normal heart rate noted  Respiratory: Normal respiratory effort, no problems with respiration noted  Abdomen: Soft, gravid, appropriate for gestational age. Pain/Pressure: Absent     Pelvic:  Cervical exam performed        Extremities: Normal range of motion.  Edema: None  Mental Status: Normal mood and affect. Normal behavior. Normal judgment and thought content.   Urinalysis:      Assessment and Plan:  Pregnancy: [id number] at [redacted]w[redacted]d  1. Supervision of high risk pregnancy, antepartum Labor precautions - GC/Chlamydia probe amp ([place])not at [facility] - Culture, beta strep (group b only)  2. History of cesarean delivery, currently  pregnant TOLAC signed today Pt still working on Medicaid, has one more form to complete Discussed need to have for BTL. Hopefully she will have soon and be able to sign BTL papers and have completed in a PP setting. Pt is agreeable to this plan  Preterm labor symptoms and general obstetric precautions including but not limited to vaginal bleeding, contractions, leaking of fluid and fetal movement were reviewed in detail with the patient. Please refer to After Visit Summary for other counseling recommendations.  Return in about 1 week (around 10/30/2019) for OB visit, face to face, pt request, MD provider.   Hermina Staggers, MD

## 2019-10-24 ENCOUNTER — Telehealth: Payer: Self-pay | Admitting: Family Medicine

## 2019-10-24 LAB — GC/CHLAMYDIA PROBE AMP (~~LOC~~) NOT AT ARMC
Chlamydia: NEGATIVE
Comment: NEGATIVE
Comment: NORMAL
Neisseria Gonorrhea: NEGATIVE

## 2019-10-24 NOTE — Telephone Encounter (Signed)
Patient called in stating that she called yesterday about her prescription being to expensive and if there was something else that could be prescribed. Patient stated that she did not need the acyclovir medication because she does not have a cold sore or herpes. There is just a sore in her mouth. Patient instructed that a message will be sent to the nurses and they will contact her as soon as they can. Patient verbalized understanding and message sent to clinical pool.

## 2019-10-24 NOTE — Telephone Encounter (Signed)
Per chart review, attempts were made to contact pt yesterday and a MyChart message was sent to pt yesterday as well by Raynald Blend, RN regarding her medication. Pt has not read the message

## 2019-10-25 NOTE — Telephone Encounter (Signed)
Attempted to call patient, there was no answer and answering machine did not pick up.

## 2019-10-26 LAB — CULTURE, BETA STREP (GROUP B ONLY): Strep Gp B Culture: NEGATIVE

## 2019-10-30 ENCOUNTER — Telehealth (INDEPENDENT_AMBULATORY_CARE_PROVIDER_SITE_OTHER): Payer: Medicaid Other | Admitting: Obstetrics and Gynecology

## 2019-10-30 ENCOUNTER — Encounter: Payer: Self-pay | Admitting: Obstetrics and Gynecology

## 2019-10-30 DIAGNOSIS — O34219 Maternal care for unspecified type scar from previous cesarean delivery: Secondary | ICD-10-CM

## 2019-10-30 DIAGNOSIS — Z3A37 37 weeks gestation of pregnancy: Secondary | ICD-10-CM

## 2019-10-30 DIAGNOSIS — O0990 Supervision of high risk pregnancy, unspecified, unspecified trimester: Secondary | ICD-10-CM

## 2019-10-30 MED ORDER — PROMETHAZINE HCL 25 MG PO TABS: 25.0000 mg | ORAL_TABLET | Freq: Four times a day (QID) | ORAL | 2 refills | Status: AC | PRN

## 2019-10-30 NOTE — Progress Notes (Signed)
I connected with  [name] on 10/30/19 at  9:20 AM EDT by telephone and verified that I am speaking with the correct person using two identifiers.   I discussed the limitations, risks, security and privacy concerns of performing an evaluation and management service by telephone and the availability of in person appointments. I also discussed with the patient that there may be a patient responsible charge related to this service. The patient expressed understanding and agreed to proceed.  Henrietta Dine, CMA 10/30/2019  9:39 AM

## 2019-10-30 NOTE — Progress Notes (Signed)
   OBSTETRICS PRENATAL VIRTUAL VISIT ENCOUNTER NOTE  Provider location: [facility] for [facility]   I connected with Monique Harris on 10/30/19 at  9:20 AM EDT by MyChart Video Encounter at home and verified that I am speaking with the correct person using two identifiers.   I discussed the limitations, risks, security and privacy concerns of performing an evaluation and management service virtually and the availability of in person appointments. I also discussed with the patient that there may be a patient responsible charge related to this service. The patient expressed understanding and agreed to proceed. Subjective:  Monique Harris is a 29 y.o. [id number] at [redacted]w[redacted]d being seen today for ongoing prenatal care.  She is currently monitored for the following issues for this low-risk pregnancy and has History of cesarean delivery, currently pregnant; History of placenta abruption; Grand multiparity; Abnormal Pap smear of cervix; Precipitous delivery; Supervision of high risk pregnancy, antepartum; Short interval between pregnancies affecting pregnancy, antepartum; History of shoulder dystocia in prior pregnancy, currently pregnant; and HPV in female on their problem list.  Patient reports general discomforts of pregnancy plus some N/V. Was unable to afford acycloviar cream..  Contractions: Irritability. Vag. Bleeding: None.  Movement: Present. Denies any leaking of fluid.   The following portions of the patient's history were reviewed and updated as appropriate: allergies, current medications, past family history, past medical history, past social history, past surgical history and problem list.   Objective:  There were no vitals filed for this visit.  Fetal Status:     Movement: Present     General:  Alert, oriented and cooperative. Patient is in no acute distress.  Respiratory: Normal respiratory effort, no problems with respiration noted  Mental Status: Normal mood  and affect. Normal behavior. Normal judgment and thought content.  Rest of physical exam deferred due to type of encounter  Imaging: No results found.  Assessment and Plan:  Pregnancy: [id number] at [redacted]w[redacted]d 1. Supervision of high risk pregnancy, antepartum Stable Labor precautions Phenergan for N/V.   2. History of cesarean delivery, currently pregnant TOLAC consent already signed Reports Medicaid is now active. Will have her sign BTL papers with next visit  Term labor symptoms and general obstetric precautions including but not limited to vaginal bleeding, contractions, leaking of fluid and fetal movement were reviewed in detail with the patient. I discussed the assessment and treatment plan with the patient. The patient was provided an opportunity to ask questions and all were answered. The patient agreed with the plan and demonstrated an understanding of the instructions. The patient was advised to call back or seek an in-person office evaluation/go to [facility] at [facility] for any urgent or concerning symptoms. Please refer to After Visit Summary for other counseling recommendations.   I provided 8 minutes of face-to-face time during this encounter.  Return in about 1 week (around 11/06/2019) for face to face, any provider.  Future Appointments  Date Time Provider Department Center  11/06/2019  2:35 PM Hermina Staggers, MD [facility]  11/13/2019  8:55 AM Malachy Chamber, MD [facility]    Hermina Staggers, MD Center for [facility], [facility]

## 2019-11-06 ENCOUNTER — Encounter: Payer: Self-pay | Admitting: Obstetrics and Gynecology

## 2019-11-08 ENCOUNTER — Encounter: Payer: Self-pay | Admitting: *Deleted

## 2019-11-13 ENCOUNTER — Other Ambulatory Visit: Payer: Self-pay

## 2019-11-13 ENCOUNTER — Ambulatory Visit (INDEPENDENT_AMBULATORY_CARE_PROVIDER_SITE_OTHER): Payer: Medicaid Other | Admitting: Obstetrics & Gynecology

## 2019-11-13 VITALS — BP 117/69 | HR 79 | Wt 154.0 lb

## 2019-11-13 DIAGNOSIS — O34219 Maternal care for unspecified type scar from previous cesarean delivery: Secondary | ICD-10-CM

## 2019-11-13 DIAGNOSIS — Z3A39 39 weeks gestation of pregnancy: Secondary | ICD-10-CM

## 2019-11-13 DIAGNOSIS — Z3009 Encounter for other general counseling and advice on contraception: Secondary | ICD-10-CM

## 2019-11-13 DIAGNOSIS — Z641 Problems related to multiparity: Secondary | ICD-10-CM

## 2019-11-13 DIAGNOSIS — O0990 Supervision of high risk pregnancy, unspecified, unspecified trimester: Secondary | ICD-10-CM

## 2019-11-13 NOTE — Progress Notes (Signed)
Subjective:    Monique Harris is a 29 y.o. [id number] [redacted]w[redacted]d being seen today for her obstetrical visit.  Patient reports backache, no bleeding and occasional contractions. Fetal movement: normal.  Objective:    BP 117/69   Pulse 79   Wt 69.9 kg   LMP 01/26/2019   BMI 28.17 kg/m   Physical Exam  Nursing note and vitals reviewed. Constitutional: She is oriented to person, place, and time. She appears well-developed and well-nourished.  HENT:  Head: Normocephalic and atraumatic.  Eyes: Pupils are equal, round, and reactive to light.  Cardiovascular: Normal rate.  Respiratory: Effort normal.  GI: Soft. She exhibits no distension. There is no abdominal tenderness. There is no guarding.  Genitourinary:    Vulva normal.   Musculoskeletal:        General: No edema.     Cervical back: Normal range of motion.  Neurological: She is alert and oriented to person, place, and time.  Skin: Skin is warm and dry.  Psychiatric: She has a normal mood and affect. Her behavior is normal.    Maternal Exam:  Uterine Assessment: Contraction strength is mild.  Contraction frequency is irregular.   Abdomen: Patient reports no abdominal tenderness. Surgical scars: low transverse.   Fundal height is 38.   Estimated fetal weight is 6.5 lbs .   Fetal presentation: vertex  Introitus: Normal vulva. Normal vagina.  Ferning test: not done.  Nitrazine test: not done. Amniotic fluid character: not assessed.  Pelvis: adequate for delivery.   Cervix: Cervix evaluated by digital exam.   1/50/-4    FHT: Fetal Heart Rate (bpm): 157  Uterine Size:  38 cm  Presentation:  vertex     Assessment:    Pregnancy:  [id number]    Plan:    Patient Active Problem List   Diagnosis Date Noted  . HPV in female 07/01/2019  . Supervision of high risk pregnancy, antepartum 06/21/2019  . Short interval between pregnancies affecting pregnancy, antepartum 06/21/2019  . History of shoulder dystocia in prior  pregnancy, currently pregnant 06/21/2019  . Precipitous delivery 03/31/2018  . Abnormal Pap smear of cervix 10/17/2017  . Grand multiparity 09/14/2017  . History of cesarean delivery, currently pregnant 10/11/2016  . History of placenta abruption 10/11/2016    Consent signed. Tubal ligation  Pt desires TOLAC, prior VBACx1 Follow up in 1 Week.

## 2019-11-13 NOTE — Patient Instructions (Signed)
Vaginal Birth After Cesarean Delivery  Vaginal birth after cesarean delivery (VBAC) is giving birth vaginally after previously delivering a baby through a cesarean section (C-section). A VBAC may be a safe option for you, depending on your health and other factors. It is important to discuss VBAC with your health care provider early in your pregnancy so you can understand the risks, benefits, and options. Having these discussions early will give you time to make your birth plan. Who are the best candidates for VBAC? The best candidates for VBAC are women who:  Have had one or two prior cesarean deliveries, and the incision made during the delivery was horizontal (low transverse).  Do not have a vertical (classical) scar on their uterus.  Have not had a tear in the wall of their uterus (uterine rupture).  Plan to have more pregnancies. A VBAC is also more likely to be successful:  In women who have previously given birth vaginally.  When labor starts by itself (spontaneously) before the due date. What are the benefits of VBAC? The benefits of delivering your baby vaginally instead of by a cesarean delivery include:  A shorter hospital stay.  A faster recovery time.  Less pain.  Avoiding risks associated with major surgery, such as infection and blood clots.  Less blood loss and less need for donated blood (transfusions). What are the risks of VBAC? The main risk of attempting a VBAC is that it may fail, forcing your health care provider to deliver your baby by a C-section. Other risks are rare and include:  Tearing (rupture) of the scar from a past cesarean delivery.  Other risks associated with vaginal deliveries. If a repeat cesarean delivery is needed, the risks include:  Blood loss.  Infection.  Blood clot.  Damage to surrounding organs.  Removal of the uterus (hysterectomy), if it is damaged.  Placenta problems in future pregnancies. What else should I know  about my options? Delivering a baby through a VBAC is similar to having a normal spontaneous vaginal delivery. Therefore, it is safe:  To try with twins.  For your health care provider to try to turn the baby from a breech position (external cephalic version) during labor.  With epidural analgesia for pain relief. Consider where you would like to deliver your baby. VBAC should be attempted in facilities where an emergency cesarean delivery can be performed. VBAC is not recommended for home births. Any changes in your health or your baby's health during your pregnancy may make it necessary to change your initial decision about VBAC. Your health care provider may recommend that you do not attempt a VBAC if:  Your baby's suspected weight is 8.8 lb (4 kg) or more.  You have preeclampsia. This is a condition that causes high blood pressure along with other symptoms, such as swelling and headaches.  You will have VBAC less than 19 months after your cesarean delivery.  You are past your due date.  You need to have labor started (induced) because your cervix is not ready for labor (unfavorable). Where to find more information  American Pregnancy Association: americanpregnancy.org  American Congress of Obstetricians and Gynecologists: acog.org Summary  Vaginal birth after cesarean delivery (VBAC) is giving birth vaginally after previously delivering a baby through a cesarean section (C-section). A VBAC may be a safe option for you, depending on your health and other factors.  Discuss VBAC with your health care provider early in your pregnancy so you can understand the risks, benefits, options, and   have plenty of time to make your birth plan.  The main risk of attempting a VBAC is that it may fail, forcing your health care provider to deliver your baby by a C-section. Other risks are rare. This information is not intended to replace advice given to you by your health care provider. Make sure  you discuss any questions you have with your health care provider. Document Revised: 09/25/2018 Document Reviewed: 09/06/2016 Elsevier Patient Education  2020 Elsevier Inc.  

## 2019-11-14 ENCOUNTER — Other Ambulatory Visit: Payer: Self-pay

## 2019-11-14 ENCOUNTER — Encounter (HOSPITAL_COMMUNITY): Payer: Self-pay | Admitting: Obstetrics & Gynecology

## 2019-11-14 ENCOUNTER — Inpatient Hospital Stay (HOSPITAL_COMMUNITY)
Admission: AD | Admit: 2019-11-14 | Discharge: 2019-11-16 | DRG: 806 | Disposition: A | Discharge status: Home / Self Care | Payer: Medicaid Other | Attending: Obstetrics & Gynecology | Admitting: Obstetrics & Gynecology

## 2019-11-14 DIAGNOSIS — B977 Papillomavirus as the cause of diseases classified elsewhere: Secondary | ICD-10-CM | POA: Diagnosis present

## 2019-11-14 DIAGNOSIS — O34219 Maternal care for unspecified type scar from previous cesarean delivery: Secondary | ICD-10-CM | POA: Diagnosis present

## 2019-11-14 DIAGNOSIS — Z8759 Personal history of other complications of pregnancy, childbirth and the puerperium: Secondary | ICD-10-CM

## 2019-11-14 DIAGNOSIS — F17210 Nicotine dependence, cigarettes, uncomplicated: Secondary | ICD-10-CM | POA: Diagnosis present

## 2019-11-14 DIAGNOSIS — O9832 Other infections with a predominantly sexual mode of transmission complicating childbirth: Secondary | ICD-10-CM | POA: Diagnosis present

## 2019-11-14 DIAGNOSIS — Z641 Problems related to multiparity: Secondary | ICD-10-CM

## 2019-11-14 DIAGNOSIS — Z3A39 39 weeks gestation of pregnancy: Secondary | ICD-10-CM

## 2019-11-14 DIAGNOSIS — Z20822 Contact with and (suspected) exposure to covid-19: Secondary | ICD-10-CM | POA: Diagnosis present

## 2019-11-14 DIAGNOSIS — O09899 Supervision of other high risk pregnancies, unspecified trimester: Secondary | ICD-10-CM

## 2019-11-14 DIAGNOSIS — R87619 Unspecified abnormal cytological findings in specimens from cervix uteri: Secondary | ICD-10-CM | POA: Diagnosis present

## 2019-11-14 DIAGNOSIS — O0990 Supervision of high risk pregnancy, unspecified, unspecified trimester: Secondary | ICD-10-CM

## 2019-11-14 DIAGNOSIS — A630 Anogenital (venereal) warts: Secondary | ICD-10-CM | POA: Diagnosis present

## 2019-11-14 DIAGNOSIS — O99334 Smoking (tobacco) complicating childbirth: Secondary | ICD-10-CM | POA: Diagnosis present

## 2019-11-14 DIAGNOSIS — O09299 Supervision of pregnancy with other poor reproductive or obstetric history, unspecified trimester: Secondary | ICD-10-CM

## 2019-11-14 LAB — CBC
HCT: 29.1 % — ABNORMAL LOW (ref 36.0–46.0)
Hemoglobin: 8.9 g/dL — ABNORMAL LOW (ref 12.0–15.0)
MCH: 23.7 pg — ABNORMAL LOW (ref 26.0–34.0)
MCHC: 30.6 g/dL (ref 30.0–36.0)
MCV: 77.4 fL — ABNORMAL LOW (ref 80.0–100.0)
Platelets: 270 10*3/uL (ref 150–400)
RBC: 3.76 MIL/uL — ABNORMAL LOW (ref 3.87–5.11)
RDW: 15.3 % (ref 11.5–15.5)
WBC: 9.1 10*3/uL (ref 4.0–10.5)
nRBC: 0 % (ref 0.0–0.2)

## 2019-11-14 LAB — ABO/RH: ABO/RH(D): O POS

## 2019-11-14 LAB — TYPE AND SCREEN
ABO/RH(D): O POS
Antibody Screen: NEGATIVE

## 2019-11-14 LAB — SARS CORONAVIRUS 2 BY RT PCR (HOSPITAL ORDER, PERFORMED IN ~~LOC~~ HOSPITAL LAB): SARS Coronavirus 2: NEGATIVE

## 2019-11-14 MED ORDER — IBUPROFEN 600 MG PO TABS
600.0000 mg | ORAL_TABLET | Freq: Three times a day (TID) | ORAL | Status: DC | PRN
  Administered 2019-11-14 – 2019-11-16 (×4): 600 mg via ORAL
  Filled 2019-11-14 (×4): qty 1

## 2019-11-14 MED ORDER — PRENATAL MULTIVITAMIN CH
1.0000 | ORAL_TABLET | Freq: Every day | ORAL | Status: DC
  Administered 2019-11-15: 1 via ORAL
  Filled 2019-11-14: qty 1

## 2019-11-14 MED ORDER — DIPHENHYDRAMINE HCL 25 MG PO CAPS: 25.0000 mg | ORAL_CAPSULE | Freq: Four times a day (QID) | ORAL | Status: DC | PRN

## 2019-11-14 MED ORDER — ACETAMINOPHEN 325 MG PO TABS
650.0000 mg | ORAL_TABLET | ORAL | Status: DC | PRN
  Administered 2019-11-14: 650 mg via ORAL
  Filled 2019-11-14: qty 2

## 2019-11-14 MED ORDER — ONDANSETRON HCL 4 MG/2ML IJ SOLN: 4.0000 mg | Freq: Four times a day (QID) | INTRAMUSCULAR | Status: DC | PRN

## 2019-11-14 MED ORDER — MEASLES, MUMPS & RUBELLA VAC IJ SOLR: 0.5000 mL | Freq: Once | INTRAMUSCULAR | Status: DC

## 2019-11-14 MED ORDER — ONDANSETRON HCL 4 MG PO TABS: 4.0000 mg | ORAL_TABLET | ORAL | Status: DC | PRN

## 2019-11-14 MED ORDER — FENTANYL CITRATE (PF) 100 MCG/2ML IJ SOLN: 100.0000 ug | INTRAMUSCULAR | Status: DC | PRN

## 2019-11-14 MED ORDER — OXYTOCIN-SODIUM CHLORIDE 30-0.9 UT/500ML-% IV SOLN
INTRAVENOUS | Status: AC
  Administered 2019-11-14: 500 mL via INTRAVENOUS
  Filled 2019-11-14: qty 500

## 2019-11-14 MED ORDER — DIBUCAINE (PERIANAL) 1 % EX OINT
1.0000 "application " | TOPICAL_OINTMENT | CUTANEOUS | Status: DC | PRN
  Administered 2019-11-14: 1 via RECTAL
  Filled 2019-11-14: qty 28

## 2019-11-14 MED ORDER — TETANUS-DIPHTH-ACELL PERTUSSIS 5-2.5-18.5 LF-MCG/0.5 IM SUSP: 0.5000 mL | Freq: Once | INTRAMUSCULAR | Status: DC

## 2019-11-14 MED ORDER — ACETAMINOPHEN 325 MG PO TABS
650.0000 mg | ORAL_TABLET | Freq: Four times a day (QID) | ORAL | Status: DC | PRN
  Administered 2019-11-14 – 2019-11-15 (×3): 650 mg via ORAL
  Filled 2019-11-14 (×3): qty 2

## 2019-11-14 MED ORDER — OXYTOCIN 10 UNIT/ML IJ SOLN
INTRAMUSCULAR | Status: AC
  Filled 2019-11-14: qty 2

## 2019-11-14 MED ORDER — TRANEXAMIC ACID-NACL 1000-0.7 MG/100ML-% IV SOLN
1000.0000 mg | INTRAVENOUS | Status: AC
  Administered 2019-11-14: 1000 mg via INTRAVENOUS
  Filled 2019-11-14: qty 100

## 2019-11-14 MED ORDER — OXYTOCIN-SODIUM CHLORIDE 30-0.9 UT/500ML-% IV SOLN: 2.5000 [IU]/h | INTRAVENOUS | Status: DC

## 2019-11-14 MED ORDER — BENZOCAINE-MENTHOL 20-0.5 % EX AERO: 1.0000 "application " | INHALATION_SPRAY | CUTANEOUS | Status: DC | PRN

## 2019-11-14 MED ORDER — COCONUT OIL OIL: 1.0000 "application " | TOPICAL_OIL | Status: DC | PRN

## 2019-11-14 MED ORDER — SENNOSIDES-DOCUSATE SODIUM 8.6-50 MG PO TABS
2.0000 | ORAL_TABLET | ORAL | Status: DC
  Filled 2019-11-14 (×2): qty 2

## 2019-11-14 MED ORDER — LACTATED RINGERS IV SOLN: 500.0000 mL | INTRAVENOUS | Status: DC | PRN

## 2019-11-14 MED ORDER — OXYTOCIN BOLUS FROM INFUSION: 500.0000 mL | Freq: Once | INTRAVENOUS | Status: AC

## 2019-11-14 MED ORDER — SOD CITRATE-CITRIC ACID 500-334 MG/5ML PO SOLN: 30.0000 mL | ORAL | Status: DC | PRN

## 2019-11-14 MED ORDER — ONDANSETRON HCL 4 MG/2ML IJ SOLN: 4.0000 mg | INTRAMUSCULAR | Status: DC | PRN

## 2019-11-14 MED ORDER — WITCH HAZEL-GLYCERIN EX PADS: 1.0000 "application " | MEDICATED_PAD | CUTANEOUS | Status: DC | PRN

## 2019-11-14 MED ORDER — LACTATED RINGERS IV SOLN: INTRAVENOUS | Status: DC

## 2019-11-14 MED ORDER — LIDOCAINE HCL (PF) 1 % IJ SOLN: 30.0000 mL | INTRAMUSCULAR | Status: DC | PRN

## 2019-11-14 MED ORDER — SIMETHICONE 80 MG PO CHEW: 80.0000 mg | CHEWABLE_TABLET | ORAL | Status: DC | PRN

## 2019-11-14 NOTE — H&P (Signed)
Monique Harris is a 29 y.o. female [phone] at [redacted]w[redacted]d with hx vaginal delivery x 3 followed by C/S x 1, then successful VBAC x 2 presenting for labor evaluation.    Nursing Staff Provider  Office Location  [facility] Dating   early ultrasound  Language   English Anatomy US   normal  Flu Vaccine  Declined  Genetic Screen  NIPS:   AFP:       TDaP vaccine  09/16/19  Hgb A1C or  GTT Early 5.5 Third trimester   Rhogam     LAB RESULTS   Feeding Plan  Bottle Blood Type     Contraception  BTL Antibody    Circumcision  No Rubella   Immune   Pediatrician   [facility]  RPR NON REACTIVE (10/26 1422)   Support Person  Undecided HBsAg     Prenatal Classes  NA HIV Non Reactive (04/15 1155)  BTL Consent  sign @ 28 weeks GBS  negative  VBAC Consent  sign @ 28 weeks Pap 06/26/19- normal +HRHPV - cotesting in 1 year    Hgb Electro  negative  BP Cuff  order when medicaid active CF     SMA negative    Waterbirth  [ ]  Class [ ]  Consent [ ]  CNM visit   OB History    Gravida  10   Para  6   Term  6   Preterm  0   AB  3   Living  6     SAB  1   TAB  2   Ectopic  0   Multiple  0   Live Births  6          Past Medical History:  Diagnosis Date  . Abnormal Pap smear    Past Surgical History:  Procedure Laterality Date  . CESAREAN SECTION  01/02/2012   Procedure: CESAREAN SECTION;  Surgeon: , MD;  Location: [facility];  Service: Gynecology;  Laterality: N/A;  Primary cesarean section of baby boy ay 0125 APGAR 8/9. NRFHT, Abruption=5%   Family History: family history is not on file. Social History:  reports that she has been smoking cigarettes. She has been smoking about 0.25 packs per day. She has never used smokeless tobacco. She reports that she does not drink alcohol or use drugs.     Maternal Diabetes: No Genetic Screening: Declined Maternal Ultrasounds/Referrals: Normal Fetal Ultrasounds or other Referrals:  None Maternal Substance Abuse:   No Significant Maternal Medications:  None Significant Maternal Lab Results:  Group B Strep negative Other Comments:  None  Review of Systems  Constitutional: Negative for chills, fatigue and fever.  Eyes: Negative for visual disturbance.  Respiratory: Negative for shortness of breath.   Cardiovascular: Negative for chest pain.  Gastrointestinal: Positive for abdominal pain. Negative for vomiting.  Genitourinary: Positive for pelvic pain. Negative for difficulty urinating, dysuria, flank pain, vaginal bleeding, vaginal discharge and vaginal pain.  Neurological: Negative for dizziness and headaches.  Psychiatric/Behavioral: Negative.    Maternal Medical History:  Reason for admission: Contractions.   Contractions: Onset was 1-2 hours ago.   Frequency: regular.   Perceived severity is strong.    Fetal activity: Perceived fetal activity is normal.   Last perceived fetal movement was within the past hour.    Prenatal complications: no prenatal complications Prenatal Complications - Diabetes: none.      Blood pressure (!) 155/69, pulse 99, last menstrual period 01/26/2019, SpO2 98 %, not  currently breastfeeding. Maternal Exam:  Uterine Assessment: Contraction strength is moderate.  Contraction duration is 1 minute. Contraction frequency is regular.   Abdomen: Surgical scars: low transverse.   Fetal presentation: vertex  Pelvis: adequate for delivery.   Cervix: Cervix evaluated by digital exam.     Fetal Exam Fetal Monitor Review: Mode: ultrasound.   Baseline rate: 135.  Variability: moderate (6-25 bpm).   Pattern: no accelerations and variable decelerations.    Fetal State Assessment: Category II - tracings are indeterminate.     Physical Exam  Nursing note and vitals reviewed. Constitutional: She is oriented to person, place, and time. She appears well-developed and well-nourished.  Cardiovascular: Normal rate, regular rhythm and normal heart sounds.  Respiratory:  Effort normal and breath sounds normal.  GI: Soft.  Musculoskeletal:        General: Normal range of motion.     Cervical back: Normal range of motion.  Neurological: She is alert and oriented to person, place, and time.  Skin: Skin is warm and dry.  Psychiatric: She has a normal mood and affect. Her behavior is normal. Judgment and thought content normal.    Prenatal labs: ABO, Rh: O/Positive/-- (01/13 1538) Antibody: Negative (01/13 1538) Rubella: 14.60 (01/13 1538) RPR: Non Reactive (04/05 0843)  HBsAg: Negative (01/13 1538)  HIV: Non Reactive (04/05 0843)  GBS: Negative/-- (05/12 1404)   Assessment/Plan: [id number] at [redacted]w[redacted]d in active labor at term Hx Cesarean section x 1 Hx successful VBAC x 2 GBS negative Desires BTL  Admit to L&D Expectant management Anticipate NSVD   Fatima Blank 11/14/2019, 5:50 PM

## 2019-11-14 NOTE — MAU Note (Signed)
Report given to L&D CN. Pt transported w/MD at bedside. Unable to complete assessment d/t advanced labor.
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
## 2019-11-14 NOTE — MAU Note (Signed)
Pt c/o of contractions that started "a while ago, " unsure of time. Denies LOF, VB. +FM.

## 2019-11-15 LAB — RPR: RPR Ser Ql: NONREACTIVE

## 2019-11-15 MED ORDER — FERROUS SULFATE 325 (65 FE) MG PO TABS
325.0000 mg | ORAL_TABLET | Freq: Every day | ORAL | Status: DC
  Administered 2019-11-15 – 2019-11-16 (×2): 325 mg via ORAL
  Filled 2019-11-15 (×2): qty 1
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
## 2019-11-15 NOTE — Progress Notes (Signed)
Pt requests that Leotis Shames be the only visitor allowed to hospital. Visitor tab and security updated. Rutherford Guys and Shanda Bumps, day shift, notified of pts confidential status.   Elvia Collum, RN

## 2019-11-15 NOTE — Progress Notes (Addendum)
Post Partum Day 1  Subjective:  Monique Harris is a 29 y.o. [id number] [redacted]w[redacted]d s/p VBAC w/ SOL.  No acute events overnight.  Pt denies problems with ambulating, voiding or po intake.  She denies nausea or vomiting.  Pain is well controlled.  Lochia Moderate.  Plan for birth control is tubal ligation after d/c.  Method of Feeding: bottle  Objective: BP 121/76 (BP Location: Right Arm)   Pulse 87   Temp 98.1 F (36.7 C) (Axillary)   Resp 18   LMP 01/26/2019   SpO2 100%   Breastfeeding Unknown   Physical Exam:  General: alert, cooperative and no distress Lochia:normal flow Chest: normal WOB Heart: regular rate Abdomen: soft, nontender, fundus firm below umbilicus Uterine Fundus: firm DVT Evaluation: No evidence of DVT seen on physical exam. Extremities: trace edema  Recent Labs    11/14/19 1757  HGB 8.9*  HCT 29.1*    Assessment/Plan:  ASSESSMENT: Monique Harris is a 29 y.o. [id number] [redacted]w[redacted]d ppd #1 s/p NSVD doing well.   Discharge home, Social Work consult and Circumcision prior to discharge   LOS: 1 day   Alexa Holloway 11/15/2019, 6:59 AM   I saw and evaluated the patient. I agree with the findings and the plan of care as documented in the student's note. PO iron initiated. Awaiting SW consult. Okay to discharge today if baby can; RN to page team for orders. Requested interval BTL to be scheduled.   Jerilynn Birkenhead, MD [facility] Fellow, [facility] for [employer], [facility]
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
## 2019-11-16 MED ORDER — IBUPROFEN 600 MG PO TABS: 600.0000 mg | ORAL_TABLET | Freq: Three times a day (TID) | ORAL | 0 refills | Status: AC | PRN

## 2019-11-16 MED ORDER — AMLODIPINE BESYLATE 5 MG PO TABS
5.0000 mg | ORAL_TABLET | Freq: Every day | ORAL | Status: DC
  Administered 2019-11-16: 5 mg via ORAL
  Filled 2019-11-16: qty 1

## 2019-11-16 MED ORDER — POLYETHYLENE GLYCOL 3350 17 GM/SCOOP PO POWD
17.0000 g | Freq: Every day | ORAL | 1 refills | Status: AC | PRN
Start: 2019-11-16 — End: ?

## 2019-11-16 MED ORDER — AMLODIPINE BESYLATE 5 MG PO TABS: 5.0000 mg | ORAL_TABLET | Freq: Every day | ORAL | 5 refills | Status: AC

## 2019-11-16 MED ORDER — ACETAMINOPHEN 325 MG PO TABS: 650.0000 mg | ORAL_TABLET | Freq: Four times a day (QID) | ORAL | 0 refills | Status: AC | PRN

## 2019-11-16 NOTE — Discharge Instructions (Signed)
Postpartum Care After Vaginal Delivery This sheet gives you information about how to care for yourself from the time you deliver your baby to up to 6-12 weeks after delivery (postpartum period). Your health care provider may also give you more specific instructions. If you have problems or questions, contact your health care provider. Follow these instructions at home: Vaginal bleeding  It is normal to have vaginal bleeding (lochia) after delivery. Wear a sanitary pad for vaginal bleeding and discharge. ? During the first week after delivery, the amount and appearance of lochia is often similar to a menstrual period. ? Over the next few weeks, it will gradually decrease to a dry, yellow-brown discharge. ? For most women, lochia stops completely by 4-6 weeks after delivery. Vaginal bleeding can vary from woman to woman.  Change your sanitary pads frequently. Watch for any changes in your flow, such as: ? A sudden increase in volume. ? A change in color. ? Large blood clots.  If you pass a blood clot from your vagina, save it and call your health care provider to discuss. Do not flush blood clots down the toilet before talking with your health care provider.  Do not use tampons or douches until your health care provider says this is safe.  If you are not breastfeeding, your period should return 6-8 weeks after delivery. If you are feeding your child breast milk only (exclusive breastfeeding), your period may not return until you stop breastfeeding. Perineal care  Keep the area between the vagina and the anus (perineum) clean and dry as told by your health care provider. Use medicated pads and pain-relieving sprays and creams as directed.  If you had a cut in the perineum (episiotomy) or a tear in the vagina, check the area for signs of infection until you are healed. Check for: ? More redness, swelling, or pain. ? Fluid or blood coming from the cut or tear. ? Warmth. ? Pus or a bad  smell.  You may be given a squirt bottle to use instead of wiping to clean the perineum area after you go to the bathroom. As you start healing, you may use the squirt bottle before wiping yourself. Make sure to wipe gently.  To relieve pain caused by an episiotomy, a tear in the vagina, or swollen veins in the anus (hemorrhoids), try taking a warm sitz bath 2-3 times a day. A sitz bath is a warm water bath that is taken while you are sitting down. The water should only come up to your hips and should cover your buttocks. Breast care  Within the first few days after delivery, your breasts may feel heavy, full, and uncomfortable (breast engorgement). Milk may also leak from your breasts. Your health care provider can suggest ways to help relieve the discomfort. Breast engorgement should go away within a few days.  If you are breastfeeding: ? Wear a bra that supports your breasts and fits you well. ? Keep your nipples clean and dry. Apply creams and ointments as told by your health care provider. ? You may need to use breast pads to absorb milk that leaks from your breasts. ? You may have uterine contractions every time you breastfeed for up to several weeks after delivery. Uterine contractions help your uterus return to its normal size. ? If you have any problems with breastfeeding, work with your health care provider or lactation consultant.  If you are not breastfeeding: ? Avoid touching your breasts a lot. Doing this can make   your breasts produce more milk. ? Wear a good-fitting bra and use cold packs to help with swelling. ? Do not squeeze out (express) milk. This causes you to make more milk. Intimacy and sexuality  Ask your health care provider when you can engage in sexual activity. This may depend on: ? Your risk of infection. ? How fast you are healing. ? Your comfort and desire to engage in sexual activity.  You are able to get pregnant after delivery, even if you have not had  your period. If desired, talk with your health care provider about methods of birth control (contraception). Medicines  Take over-the-counter and prescription medicines only as told by your health care provider.  If you were prescribed an antibiotic medicine, take it as told by your health care provider. Do not stop taking the antibiotic even if you start to feel better. Activity  Gradually return to your normal activities as told by your health care provider. Ask your health care provider what activities are safe for you.  Rest as much as possible. Try to rest or take a nap while your baby is sleeping. Eating and drinking   Drink enough fluid to keep your urine pale yellow.  Eat high-fiber foods every day. These may help prevent or relieve constipation. High-fiber foods include: ? Whole grain cereals and breads. ? Brown rice. ? Beans. ? Fresh fruits and vegetables.  Do not try to lose weight quickly by cutting back on calories.  Take your prenatal vitamins until your postpartum checkup or until your health care provider tells you it is okay to stop. Lifestyle  Do not use any products that contain nicotine or tobacco, such as cigarettes and e-cigarettes. If you need help quitting, ask your health care provider.  Do not drink alcohol, especially if you are breastfeeding. General instructions  Keep all follow-up visits for you and your baby as told by your health care provider. Most women visit their health care provider for a postpartum checkup within the first 3-6 weeks after delivery. Contact a health care provider if:  You feel unable to cope with the changes that your child brings to your life, and these feelings do not go away.  You feel unusually sad or worried.  Your breasts become red, painful, or hard.  You have a fever.  You have trouble holding urine or keeping urine from leaking.  You have little or no interest in activities you used to enjoy.  You have not  breastfed at all and you have not had a menstrual period for 12 weeks after delivery.  You have stopped breastfeeding and you have not had a menstrual period for 12 weeks after you stopped breastfeeding.  You have questions about caring for yourself or your baby.  You pass a blood clot from your vagina. Get help right away if:  You have chest pain.  You have difficulty breathing.  You have sudden, severe leg pain.  You have severe pain or cramping in your lower abdomen.  You bleed from your vagina so much that you fill more than one sanitary pad in one hour. Bleeding should not be heavier than your heaviest period.  You develop a severe headache.  You faint.  You have blurred vision or spots in your vision.  You have bad-smelling vaginal discharge.  You have thoughts about hurting yourself or your baby. If you ever feel like you may hurt yourself or others, or have thoughts about taking your own life, get help   right away. You can go to the nearest emergency department or call:  Your local emergency services (911 in the [place]  A suicide crisis helpline, such as the National Suicide Prevention Lifeline at 1-800-273-8255. This is open 24 hours a day. Summary  The period of time right after you deliver your newborn up to 6-12 weeks after delivery is called the postpartum period.  Gradually return to your normal activities as told by your health care provider.  Keep all follow-up visits for you and your baby as told by your health care provider. This information is not intended to replace advice given to you by your health care provider. Make sure you discuss any questions you have with your health care provider. Document Revised: 06/02/2017 Document Reviewed: 03/13/2017 Elsevier Patient Education  2020 Elsevier Inc. Postpartum Hypertension Postpartum hypertension is high blood pressure that remains higher than normal after childbirth. You may not realize that you have  postpartum hypertension if your blood pressure is not being checked regularly. In most cases, postpartum hypertension will go away on its own, usually within a week of delivery. However, for some women, medical treatment is required to prevent serious complications, such as seizures or stroke. What are the causes? This condition may be caused by one or more of the following:  Hypertension that existed before pregnancy (chronic hypertension).  Hypertension that comes on as a result of pregnancy (gestational hypertension).  Hypertensive disorders during pregnancy (preeclampsia) or seizures in women who have high blood pressure during pregnancy (eclampsia).  A condition in which the liver, platelets, and red blood cells are damaged during pregnancy (HELLP syndrome).  A condition in which the thyroid produces too much hormones (hyperthyroidism).  Other rare problems of the nerves (neurological disorders) or blood disorders. In some cases, the cause may not be known. What increases the risk? The following factors may make you more likely to develop this condition:  Chronic hypertension. In some cases, this may not have been diagnosed before pregnancy.  Obesity.  Type 2 diabetes.  Kidney disease.  History of preeclampsia or eclampsia.  Other medical conditions that change the level of hormones in the body (hormonal imbalance). What are the signs or symptoms? As with all types of hypertension, postpartum hypertension may not have any symptoms. Depending on how high your blood pressure is, you may experience:  Headaches. These may be mild, moderate, or severe. They may also be steady, constant, or sudden in onset (thunderclap headache).  Changes in your ability to see (visual changes).  Dizziness.  Shortness of breath.  Swelling of your hands, feet, lower legs, or face. In some cases, you may have swelling in more than one of these locations.  Heart palpitations or a racing  heartbeat.  Difficulty breathing while lying down.  Decrease in the amount of urine that you pass. Other rare signs and symptoms may include:  Sweating more than usual. This lasts longer than a few days after delivery.  Chest pain.  Sudden dizziness when you get up from sitting or lying down.  Seizures.  Nausea or vomiting.  Abdominal pain. How is this diagnosed? This condition may be diagnosed based on the results of a physical exam, blood pressure measurements, and blood and urine tests. You may also have other tests, such as a CT scan or an MRI, to check for other problems of postpartum hypertension. How is this treated? If blood pressure is high enough to require treatment, your options may include:  Medicines to reduce blood pressure (  antihypertensives). Tell your health care provider if you are breastfeeding or if you plan to breastfeed. There are many antihypertensive medicines that are safe to take while breastfeeding.  Stopping medicines that may be causing hypertension.  Treating medical conditions that are causing hypertension.  Treating the complications of hypertension, such as seizures, stroke, or kidney problems. Your health care provider will also continue to monitor your blood pressure closely until it is within a safe range for you. Follow these instructions at home:  Take over-the-counter and prescription medicines only as told by your health care provider.  Return to your normal activities as told by your health care provider. Ask your health care provider what activities are safe for you.  Do not use any products that contain nicotine or tobacco, such as cigarettes and e-cigarettes. If you need help quitting, ask your health care provider.  Keep all follow-up visits as told by your health care provider. This is important. Contact a health care provider if:  Your symptoms get worse.  You have new symptoms, such as: ? A headache that does not get  better. ? Dizziness. ? Visual changes. Get help right away if:  You suddenly develop swelling in your hands, ankles, or face.  You have sudden, rapid weight gain.  You develop difficulty breathing, chest pain, racing heartbeat, or heart palpitations.  You develop severe pain in your abdomen.  You have any symptoms of a stroke. "BE FAST" is an easy way to remember the main warning signs of a stroke: ? B - Balance. Signs are dizziness, sudden trouble walking, or loss of balance. ? E - Eyes. Signs are trouble seeing or a sudden change in vision. ? F - Face. Signs are sudden weakness or numbness of the face, or the face or eyelid drooping on one side. ? A - Arms. Signs are weakness or numbness in an arm. This happens suddenly and usually on one side of the body. ? S - Speech. Signs are sudden trouble speaking, slurred speech, or trouble understanding what people say. ? T - Time. Time to call emergency services. Write down what time symptoms started.  You have other signs of a stroke, such as: ? A sudden, severe headache with no known cause. ? Nausea or vomiting. ? Seizure. These symptoms may represent a serious problem that is an emergency. Do not wait to see if the symptoms will go away. Get medical help right away. Call your local emergency services (911 in the [place] Do not drive yourself to the hospital. Summary  Postpartum hypertension is high blood pressure that remains higher than normal after childbirth.  In most cases, postpartum hypertension will go away on its own, usually within a week of delivery.  For some women, medical treatment is required to prevent serious complications, such as seizures or stroke. This information is not intended to replace advice given to you by your health care provider. Make sure you discuss any questions you have with your health care provider. Document Revised: 07/06/2018 Document Reviewed: 03/20/2017 Elsevier Patient Education  2020 Elsevier  Inc.  
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
## 2019-11-19 ENCOUNTER — Encounter: Payer: Medicaid Other | Admitting: Student

## 2019-11-26 ENCOUNTER — Ambulatory Visit: Payer: Medicaid Other

## 2019-11-28 ENCOUNTER — Other Ambulatory Visit: Payer: Self-pay

## 2019-11-28 ENCOUNTER — Ambulatory Visit: Payer: Medicaid Other

## 2019-12-02 ENCOUNTER — Ambulatory Visit: Payer: Medicaid Other

## 2019-12-06 ENCOUNTER — Ambulatory Visit: Payer: Medicaid Other

## 2019-12-19 ENCOUNTER — Telehealth: Payer: Self-pay

## 2019-12-19 ENCOUNTER — Encounter: Payer: Self-pay | Admitting: Family Medicine

## 2019-12-19 ENCOUNTER — Ambulatory Visit: Payer: Medicaid Other | Admitting: Obstetrics and Gynecology

## 2019-12-19 NOTE — Telephone Encounter (Signed)
Called and spoke to [place], I informed her that her surgery for 7/21 was cancelled because she did not show up for her appointment w/ Dr. Earlene Plater. I advised her that if she would still like to proceed with surgery she needed to call the office and reschedule her appointment, office number provided. Patient expressed concerned that she still wanted the surgery and wanted to have the surgery on 7/21. I advised her the sooner she calls the office the better, I still had OR time where I could add her back on. Patient expressed understanding.

## 2019-12-24 ENCOUNTER — Ambulatory Visit: Payer: Medicaid Other | Admitting: Family Medicine

## 2019-12-24 ENCOUNTER — Telehealth: Payer: Medicaid Other | Admitting: Student

## 2020-01-01 ENCOUNTER — Encounter (HOSPITAL_BASED_OUTPATIENT_CLINIC_OR_DEPARTMENT_OTHER): Payer: Self-pay

## 2020-01-01 ENCOUNTER — Ambulatory Visit (HOSPITAL_BASED_OUTPATIENT_CLINIC_OR_DEPARTMENT_OTHER): Admit: 2020-01-01 | Payer: Medicaid Other | Admitting: Obstetrics and Gynecology

## 2020-01-01 SURGERY — LIGATION, FALLOPIAN TUBE, LAPAROSCOPIC
Anesthesia: Choice | Laterality: Bilateral

## 2020-01-03 ENCOUNTER — Ambulatory Visit: Payer: Medicaid Other | Admitting: Obstetrics and Gynecology

## 2020-01-13 ENCOUNTER — Ambulatory Visit: Payer: Medicaid Other | Admitting: Obstetrics and Gynecology

## 2020-01-13 ENCOUNTER — Encounter: Payer: Self-pay | Admitting: Obstetrics and Gynecology

## 2020-02-02 ENCOUNTER — Ambulatory Visit (HOSPITAL_COMMUNITY)
Admission: EM | Admit: 2020-02-02 | Discharge: 2020-02-02 | Disposition: A | Discharge status: Home / Self Care | Payer: Medicaid Other | Attending: Urgent Care | Admitting: Urgent Care

## 2020-02-02 ENCOUNTER — Encounter (HOSPITAL_COMMUNITY): Payer: Self-pay

## 2020-02-02 DIAGNOSIS — Z791 Long term (current) use of non-steroidal anti-inflammatories (NSAID): Secondary | ICD-10-CM | POA: Insufficient documentation

## 2020-02-02 DIAGNOSIS — B349 Viral infection, unspecified: Secondary | ICD-10-CM | POA: Diagnosis not present

## 2020-02-02 DIAGNOSIS — R432 Parageusia: Secondary | ICD-10-CM

## 2020-02-02 DIAGNOSIS — F17210 Nicotine dependence, cigarettes, uncomplicated: Secondary | ICD-10-CM | POA: Insufficient documentation

## 2020-02-02 DIAGNOSIS — R430 Anosmia: Secondary | ICD-10-CM

## 2020-02-02 DIAGNOSIS — R197 Diarrhea, unspecified: Secondary | ICD-10-CM

## 2020-02-02 DIAGNOSIS — Z79899 Other long term (current) drug therapy: Secondary | ICD-10-CM | POA: Insufficient documentation

## 2020-02-02 DIAGNOSIS — U071 COVID-19: Secondary | ICD-10-CM | POA: Diagnosis present

## 2020-02-02 MED ORDER — PROMETHAZINE-DM 6.25-15 MG/5ML PO SYRP
5.0000 mL | ORAL_SOLUTION | Freq: Every evening | ORAL | 0 refills | Status: AC | PRN
Start: 2020-02-02 — End: ?

## 2020-02-02 MED ORDER — BENZONATATE 100 MG PO CAPS: 100.0000 mg | ORAL_CAPSULE | Freq: Three times a day (TID) | ORAL | 0 refills | Status: AC | PRN

## 2020-02-02 NOTE — ED Triage Notes (Signed)
Pt presents with diarrhea, loss of smell and tastes for the past 3 days. Denies fever, chills, cough, body aches.

## 2020-02-02 NOTE — ED Provider Notes (Signed)
[facility]   MRN: [id number] DOB: [birth year]  Subjective:   Monique Harris is a 29 y.o. female presenting for 3 day hx of acute onset loss of taste and smell, diarrhea. Denies fever, sore throat, chest pain, cough, shob, n/v, abd pain. Has not had COVID vaccination.   No current facility-administered medications for this encounter.  Current Outpatient Medications:  .  acetaminophen (TYLENOL) 325 MG tablet, Take 2 tablets (650 mg total) by mouth every 6 (six) hours as needed (for pain scale < 4)., Disp: 30 tablet, Rfl: 0 .  amLODipine (NORVASC) 5 MG tablet, Take 1 tablet (5 mg total) by mouth daily., Disp: 30 tablet, Rfl: 5 .  ferrous sulfate 325 (65 FE) MG tablet, Take 1 tablet (325 mg total) by mouth daily with breakfast. (Patient not taking: Reported on 10/23/2019), Disp: 30 tablet, Rfl: 3 .  ibuprofen (ADVIL) 600 MG tablet, Take 1 tablet (600 mg total) by mouth every 8 (eight) hours as needed for mild pain., Disp: 30 tablet, Rfl: 0 .  omeprazole (PRILOSEC) 40 MG capsule, Take 1 capsule (40 mg total) by mouth daily. (Patient not taking: Reported on 10/30/2019), Disp: 30 capsule, Rfl: 2 .  polyethylene glycol powder (GLYCOLAX/MIRALAX) 17 GM/SCOOP powder, Take 17 g by mouth daily as needed., Disp: 510 g, Rfl: 1 .  Prenatal Vit-Fe Fumarate-FA (MULTIVITAMIN-PRENATAL) 27-0.8 MG TABS tablet, Take 1 tablet by mouth daily at 12 noon. (Patient not taking: Reported on 06/26/2019), Disp: 30 tablet, Rfl: 11 .  promethazine (PHENERGAN) 25 MG tablet, Take 1 tablet (25 mg total) by mouth every 6 (six) hours as needed for nausea or vomiting., Disp: 30 tablet, Rfl: 2   No Known Allergies  Past Medical History:  Diagnosis Date  . Abnormal Pap smear      Past Surgical History:  Procedure Laterality Date  . CESAREAN SECTION  01/02/2012   Procedure: CESAREAN SECTION;  Surgeon: Tilda Burrow, MD;  Location: [facility];  Service: Gynecology;  Laterality: N/A;  Primary cesarean section of baby  boy ay 0125 APGAR 8/9. NRFHT, Abruption=5%    Family History  Problem Relation Age of Onset  . Anesthesia problems Neg Hx   . Other Neg Hx     Social History   Tobacco Use  . Smoking status: Current Some Day Smoker    Packs/day: 0.25    Types: Cigarettes  . Smokeless tobacco: Never Used  Vaping Use  . Vaping Use: Never used  Substance Use Topics  . Alcohol use: No  . Drug use: No    ROS   Objective:   Vitals: BP 139/84 (BP Location: Left Arm)   Pulse (!) 59   Temp 98.4 F (36.9 C) (Oral)   Resp 16   LMP 01/26/2019   SpO2 100%   Breastfeeding No   Physical Exam Constitutional:      General: She is not in acute distress.    Appearance: Normal appearance. She is well-developed. She is not ill-appearing, toxic-appearing or diaphoretic.  HENT:     Head: Normocephalic and atraumatic.     Nose: Nose normal.     Mouth/Throat:     Mouth: Mucous membranes are moist.  Eyes:     Extraocular Movements: Extraocular movements intact.     Pupils: Pupils are equal, round, and reactive to light.  Cardiovascular:     Rate and Rhythm: Normal rate and regular rhythm.     Pulses: Normal pulses.     Heart sounds: Normal heart sounds. No  murmur heard.  No friction rub. No gallop.   Pulmonary:     Effort: Pulmonary effort is normal. No respiratory distress.     Breath sounds: Normal breath sounds. No stridor. No wheezing, rhonchi or rales.  Skin:    General: Skin is warm and dry.     Findings: No rash.  Neurological:     Mental Status: She is alert and oriented to person, place, and time.  Psychiatric:        Mood and Affect: Mood normal.        Behavior: Behavior normal.        Thought Content: Thought content normal.      Assessment and Plan :   PDMP not reviewed this encounter.  1. Viral syndrome   2. Diarrhea, unspecified type   3. Loss of taste   4. Loss of smell     Will manage for viral illness such as viral URI, viral syndrome, viral rhinitis, COVID-19.  Counseled patient on nature of COVID-19 including modes of transmission, diagnostic testing, management and supportive care.  Offered scripts for symptomatic relief. COVID 19 testing is pending. Counseled patient on potential for adverse effects with medications prescribed/recommended today, ER and return-to-clinic precautions discussed, patient verbalized understanding.    Wallis Bamberg, [place] 02/02/20 1601

## 2020-02-02 NOTE — Discharge Instructions (Addendum)

## 2020-02-03 LAB — SARS CORONAVIRUS 2 (TAT 6-24 HRS): SARS Coronavirus 2: POSITIVE — AB

## 2020-02-14 ENCOUNTER — Ambulatory Visit: Payer: Medicaid Other | Admitting: Medical

## 2020-02-14 ENCOUNTER — Encounter: Payer: Self-pay | Admitting: Family Medicine

## 2020-04-22 ENCOUNTER — Telehealth: Payer: Self-pay | Admitting: Family Medicine

## 2020-04-22 NOTE — Telephone Encounter (Signed)
Patient have [employer] medicaid, we no longer accept this insurance, patient called [facility] to see if she can be seen there, they told her that they need a referral from this office.

## 2020-04-23 ENCOUNTER — Encounter: Payer: Self-pay | Admitting: *Deleted

## 2020-04-23 NOTE — Telephone Encounter (Signed)
Call returned to pt to discuss her request. She did not answer and a message was left stating that I will send a secure MyChart message in response to her call.

## 2020-07-15 ENCOUNTER — Other Ambulatory Visit: Payer: Self-pay

## 2020-07-15 ENCOUNTER — Ambulatory Visit (HOSPITAL_COMMUNITY)
Admission: EM | Admit: 2020-07-15 | Discharge: 2020-07-15 | Disposition: A | Discharge status: Home / Self Care | Payer: Medicaid Other | Attending: Family Medicine | Admitting: Family Medicine

## 2020-07-15 DIAGNOSIS — Z20822 Contact with and (suspected) exposure to covid-19: Secondary | ICD-10-CM | POA: Diagnosis present

## 2020-07-15 LAB — SARS CORONAVIRUS 2 (TAT 6-24 HRS): SARS Coronavirus 2: NEGATIVE

## 2020-07-15 NOTE — ED Triage Notes (Signed)
Pt presents with no sxs. She states she request COVID test.

## 2020-09-25 IMAGING — US US MFM OB DETAIL+14 WK
1 series · 13 of 28 positions shown · non-contrast
Comparison: none

[Series 1: us mfm ob detail+14 wk · 84 acquisitions, 13 frames shown]
[im 4/84]
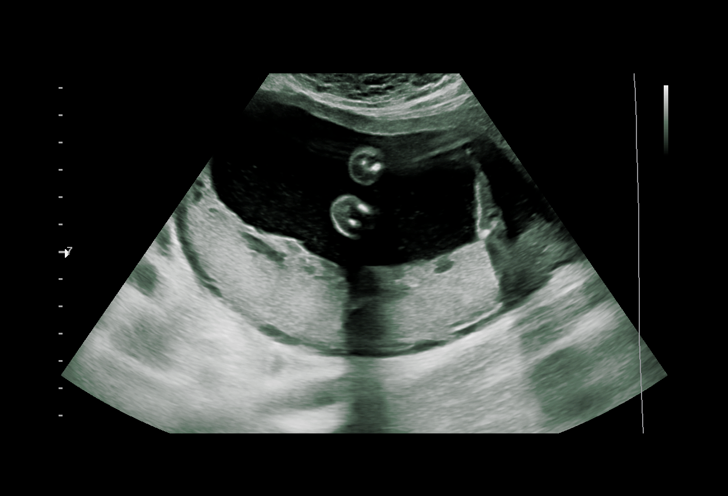
[im 10/84]
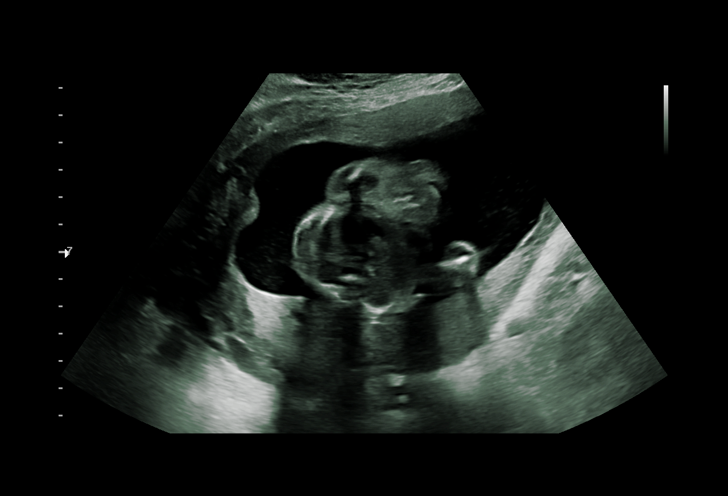
[im 16/84]
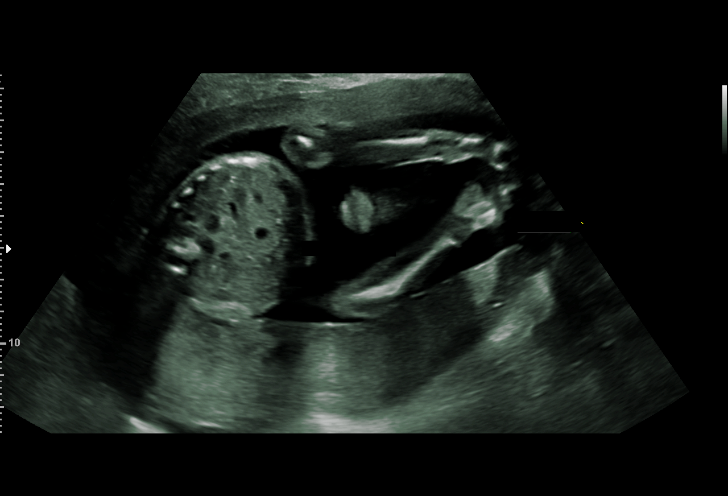
[im 22/84]
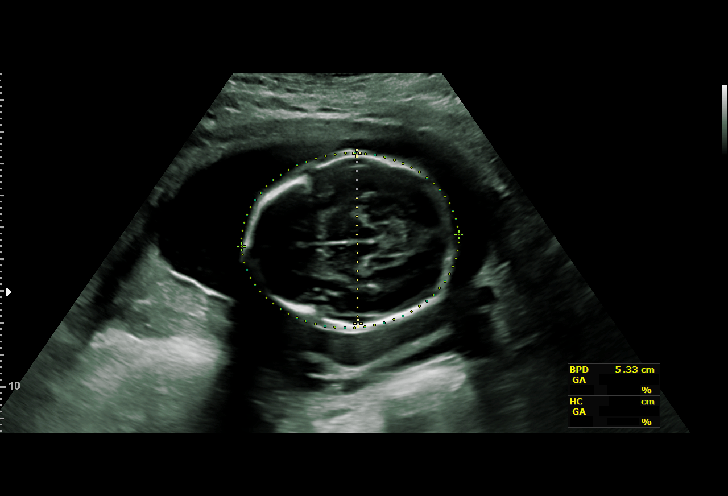
[im 28/84]
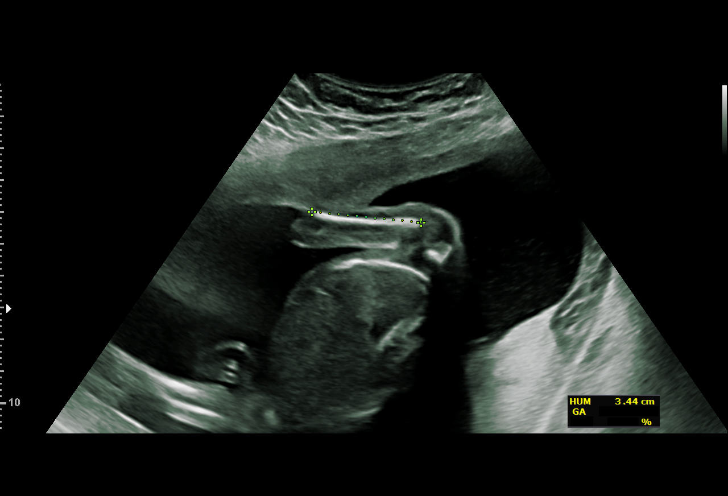
[im 34/84]
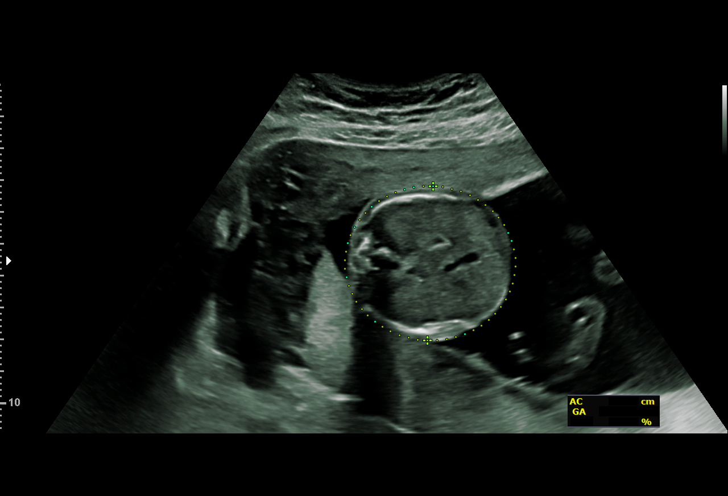
[im 44/84]
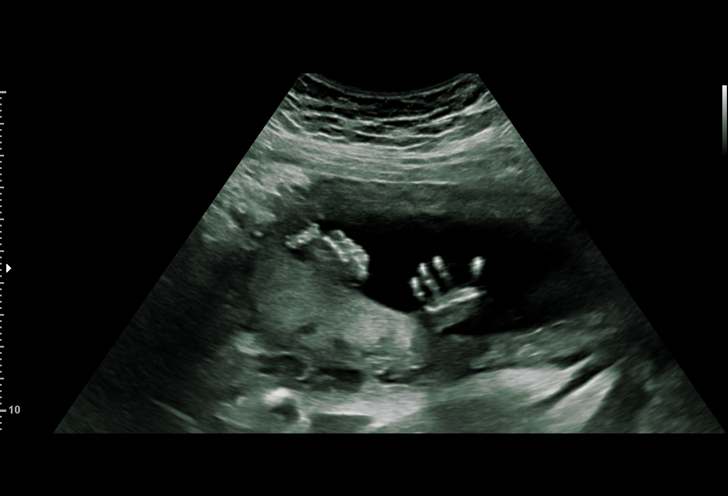
[im 50/84]
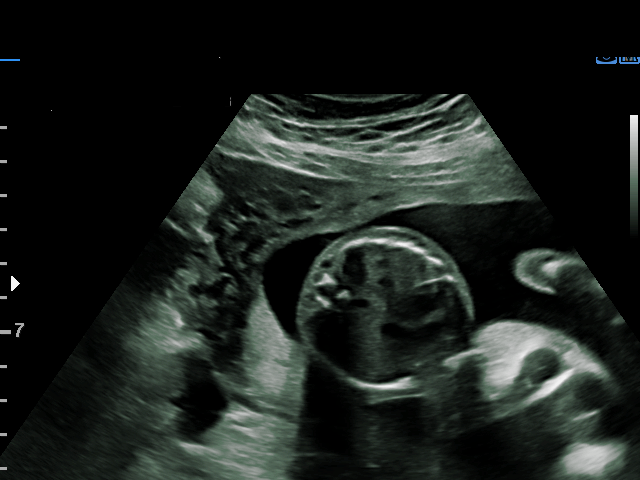
[im 56/84]
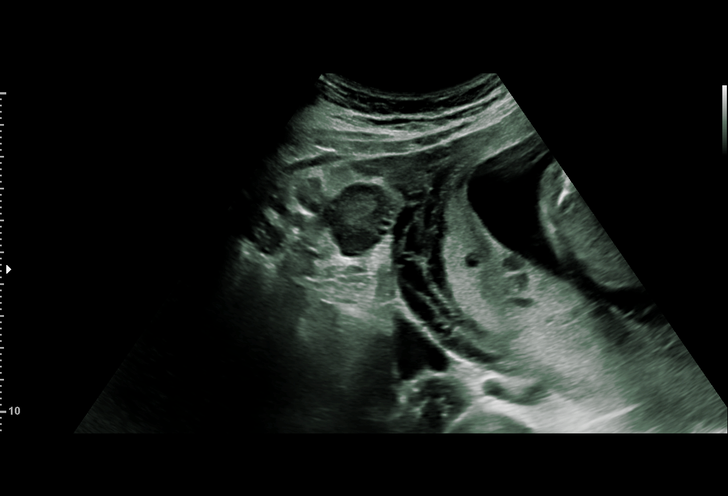
[im 62/84]
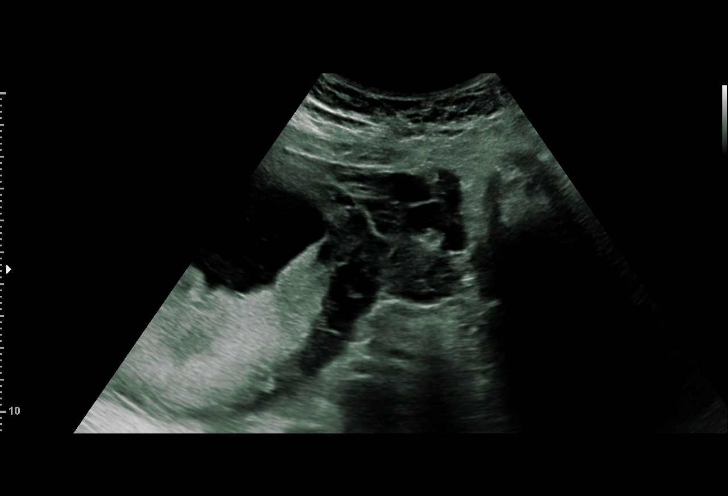
[im 68/84]
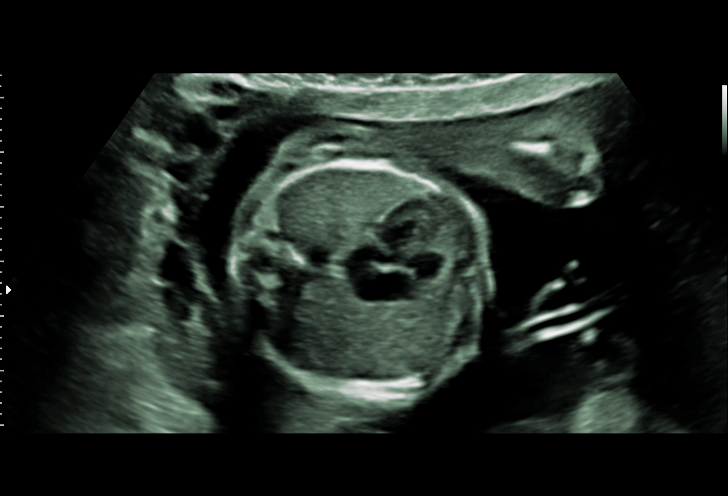
[im 74/84]
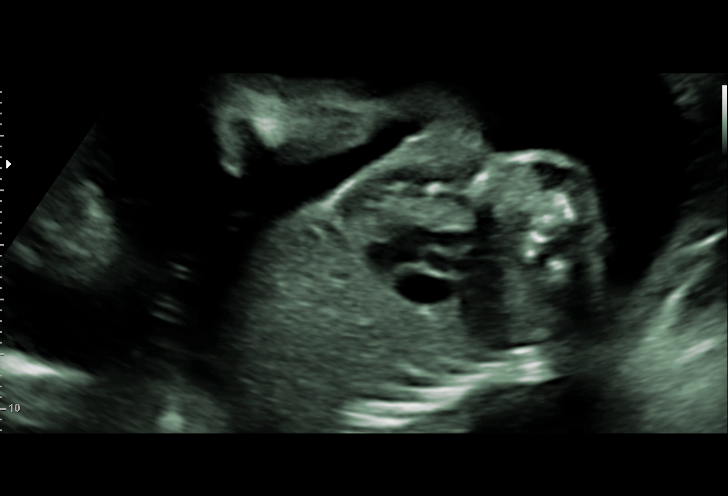
[im 80/84]
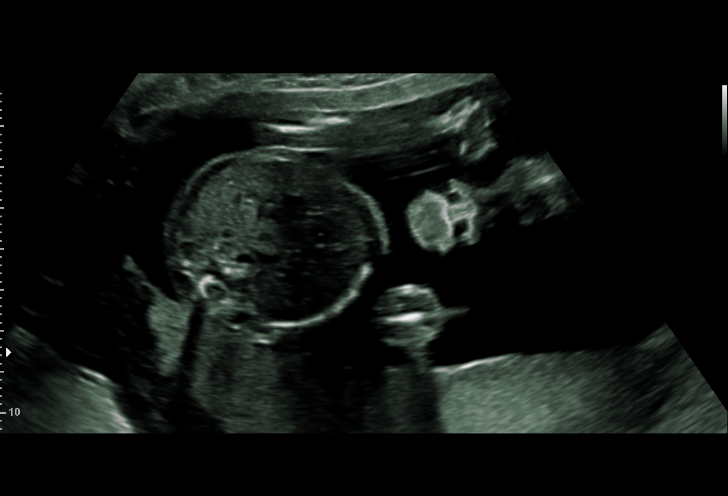

[13 of 28 positions shown; findings below may reference images not displayed]

[address]

 ----------------------------------------------------------------------

 ----------------------------------------------------------------------
Indications

  Encounter for antenatal screening for
  malformations
  21 weeks gestation of pregnancy
  Short interval between pregancies, 2nd
  trimester
  History of cesarean delivery, currently
  pregnant
  Poor obstetrical history (specify) H/O
  abruption
 ----------------------------------------------------------------------
Fetal Evaluation

 Num Of Fetuses:         1
 Fetal Heart Rate(bpm):  148
 Cardiac Activity:       Observed
 Presentation:           Cephalic
 Placenta:               Posterior

 Amniotic Fluid
 AFI FV:      Within normal limits

                             Largest Pocket(cm)

Biometry

 BPD:      53.2  mm     G. Age:  22w 1d         76  %    CI:        74.13   %    70 - 86
                                                         FL/HC:      18.3   %    15.9 -
 HC:      196.2  mm     G. Age:  21w 6d         57  %    HC/AC:      1.22        1.06 -
 AC:      161.1  mm     G. Age:  21w 1d         35  %    FL/BPD:     67.7   %
 FL:         36  mm     G. Age:  21w 3d         40  %    FL/AC:      22.3   %    20 - 24
 HUM:      35.1  mm     G. Age:  22w 0d         64  %
 CER:      22.5  mm     G. Age:  21w 2d         43  %
 LV:        5.4  mm
 CM:          5  mm
 Est. FW:     420  gm    0 lb 15 oz      42  %
OB History

 Gravidity:    10        Term:   6         SAB:   1
 TOP:          2        Living:  6
Gestational Age

 Clinical EDD:  21w 3d                                        EDD:   11/19/19
 U/S Today:     21w 5d                                        EDD:   11/17/19
 Best:          21w 3d     Det. By:  Early Ultrasound         EDD:   11/19/19
                                     (04/08/19)
Anatomy

 Cranium:               Appears normal         LVOT:                   Appears normal
 Cavum:                 Appears normal         Aortic Arch:            Appears normal
 Ventricles:            Appears normal         Ductal Arch:            Appears normal
 Choroid Plexus:        Appears normal         Diaphragm:              Appears normal
 Cerebellum:            Appears normal         Stomach:                Appears normal, left
                                                                       sided
 Posterior Fossa:       Appears normal         Abdomen:                Appears normal
 Nuchal Fold:           Not applicable (>20    Abdominal Wall:         Appears nml (cord
                        wks GA)                                        insert, abd wall)
 Face:                  Appears normal         Cord Vessels:           Appears normal (3
                        (orbits and profile)                           vessel cord)
 Lips:                  Appears normal         Kidneys:                Appear normal
 Palate:                Appears normal         Bladder:                Appears normal
 Thoracic:              Appears normal         Spine:                  Appears normal
 Heart:                 Appears normal         Upper Extremities:      Appears normal
                        (4CH, axis, and
                        situs)
 RVOT:                  Appears normal         Lower Extremities:      Appears normal

 Other:  Nasal bone visualized. Male gender. Heels and 5th digit visualized.
Cervix Uterus Adnexa

 Cervix
 Length:           4.49  cm.
 Normal appearance by transabdominal scan.

 Left Ovary
 Within normal limits.

 Right Ovary
 Within normal limits.
Impression

 G10 P6.  Patient is here for fetal anatomy scan.  Obstetric
 history is significant for 3 term vaginal deliveries followed by a
 cesarean section that was followed by 2 VBACs.  All her
 children are in good health.  Last year, she had precipitous
 delivery.  Prenatal chart mentions that in one of her deliveries
 she had placental abruption, which the patient is not aware of.

 We performed fetal anatomy scan. No makers of
 aneuploidies or fetal structural defects are seen. Fetal
 biometry is consistent with her previously-established dates.
 Amniotic fluid is normal and good fetal activity is seen.
 Placenta is posterior and there is no evidence of previa or
 accreta.  Patient understands the limitations of ultrasound in
 detecting fetal anomalies.
 I discussed screening for aneuploidies.  Patient informed that
 she will discuss at her next prenatal visit and deci[REDACTED]

## 2022-04-12 ENCOUNTER — Encounter (HOSPITAL_BASED_OUTPATIENT_CLINIC_OR_DEPARTMENT_OTHER): Payer: Self-pay | Admitting: Emergency Medicine

## 2022-04-12 ENCOUNTER — Emergency Department (HOSPITAL_BASED_OUTPATIENT_CLINIC_OR_DEPARTMENT_OTHER)
Admission: EM | Admit: 2022-04-12 | Discharge: 2022-04-12 | Disposition: A | Discharge status: Home / Self Care | Payer: Commercial Managed Care - HMO | Attending: Emergency Medicine | Admitting: Emergency Medicine

## 2022-04-12 ENCOUNTER — Other Ambulatory Visit (HOSPITAL_BASED_OUTPATIENT_CLINIC_OR_DEPARTMENT_OTHER): Payer: Self-pay

## 2022-04-12 ENCOUNTER — Other Ambulatory Visit: Payer: Self-pay

## 2022-04-12 DIAGNOSIS — S00511A Abrasion of lip, initial encounter: Secondary | ICD-10-CM | POA: Insufficient documentation

## 2022-04-12 DIAGNOSIS — S61052A Open bite of left thumb without damage to nail, initial encounter: Secondary | ICD-10-CM | POA: Diagnosis not present

## 2022-04-12 DIAGNOSIS — S6992XA Unspecified injury of left wrist, hand and finger(s), initial encounter: Secondary | ICD-10-CM | POA: Diagnosis present

## 2022-04-12 MED ORDER — AMOXICILLIN-POT CLAVULANATE 875-125 MG PO TABS
1.0000 | ORAL_TABLET | Freq: Two times a day (BID) | ORAL | 0 refills | Status: AC
  Filled 2022-04-12: qty 14, 7d supply, fill #0
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
